# Patient Record
Sex: Female | Born: 1995
Health system: Southern US, Community
[De-identification: ages and names within clinical notes are randomized; demographics above are authoritative.]

## PROBLEM LIST (undated history)

## (undated) DIAGNOSIS — K219 Gastro-esophageal reflux disease without esophagitis: Secondary | ICD-10-CM

## (undated) DIAGNOSIS — F840 Autistic disorder: Secondary | ICD-10-CM

## (undated) DIAGNOSIS — I10 Essential (primary) hypertension: Secondary | ICD-10-CM

## (undated) DIAGNOSIS — J45909 Unspecified asthma, uncomplicated: Secondary | ICD-10-CM

## (undated) HISTORY — PX: TONSILLECTOMY: SUR1361

## (undated) HISTORY — PX: CYSTECTOMY: SUR359

## (undated) HISTORY — PX: DENTAL SURGERY: SHX609

---
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
## 2016-01-16 DIAGNOSIS — Z309 Encounter for contraceptive management, unspecified: Secondary | ICD-10-CM | POA: Diagnosis not present

## 2016-04-09 DIAGNOSIS — H60531 Acute contact otitis externa, right ear: Secondary | ICD-10-CM | POA: Diagnosis not present

## 2016-04-09 DIAGNOSIS — Z309 Encounter for contraceptive management, unspecified: Secondary | ICD-10-CM | POA: Diagnosis not present

## 2016-04-09 DIAGNOSIS — H66001 Acute suppurative otitis media without spontaneous rupture of ear drum, right ear: Secondary | ICD-10-CM | POA: Diagnosis not present

## 2016-07-05 DIAGNOSIS — Z309 Encounter for contraceptive management, unspecified: Secondary | ICD-10-CM | POA: Diagnosis not present

## 2016-07-17 DIAGNOSIS — F329 Major depressive disorder, single episode, unspecified: Secondary | ICD-10-CM | POA: Diagnosis not present

## 2016-07-17 DIAGNOSIS — Z008 Encounter for other general examination: Secondary | ICD-10-CM | POA: Diagnosis not present

## 2016-07-17 DIAGNOSIS — R45851 Suicidal ideations: Secondary | ICD-10-CM | POA: Diagnosis not present

## 2016-07-17 DIAGNOSIS — F321 Major depressive disorder, single episode, moderate: Secondary | ICD-10-CM | POA: Diagnosis not present

## 2016-07-18 DIAGNOSIS — F329 Major depressive disorder, single episode, unspecified: Secondary | ICD-10-CM | POA: Diagnosis not present

## 2016-07-18 DIAGNOSIS — R45851 Suicidal ideations: Secondary | ICD-10-CM | POA: Diagnosis not present

## 2016-09-26 DIAGNOSIS — Z309 Encounter for contraceptive management, unspecified: Secondary | ICD-10-CM | POA: Diagnosis not present

## 2016-10-29 DIAGNOSIS — Z6824 Body mass index (BMI) 24.0-24.9, adult: Secondary | ICD-10-CM | POA: Diagnosis not present

## 2016-10-29 DIAGNOSIS — I10 Essential (primary) hypertension: Secondary | ICD-10-CM | POA: Diagnosis not present

## 2016-10-29 DIAGNOSIS — I482 Chronic atrial fibrillation: Secondary | ICD-10-CM | POA: Diagnosis not present

## 2016-10-29 DIAGNOSIS — F418 Other specified anxiety disorders: Secondary | ICD-10-CM | POA: Diagnosis not present

## 2016-11-14 DIAGNOSIS — Z6825 Body mass index (BMI) 25.0-25.9, adult: Secondary | ICD-10-CM | POA: Diagnosis not present

## 2016-11-14 DIAGNOSIS — I10 Essential (primary) hypertension: Secondary | ICD-10-CM | POA: Diagnosis not present

## 2016-12-12 DIAGNOSIS — I10 Essential (primary) hypertension: Secondary | ICD-10-CM | POA: Diagnosis not present

## 2016-12-12 DIAGNOSIS — Z6824 Body mass index (BMI) 24.0-24.9, adult: Secondary | ICD-10-CM | POA: Diagnosis not present

## 2016-12-12 DIAGNOSIS — Z309 Encounter for contraceptive management, unspecified: Secondary | ICD-10-CM | POA: Diagnosis not present

## 2017-02-08 DIAGNOSIS — Z79899 Other long term (current) drug therapy: Secondary | ICD-10-CM | POA: Diagnosis not present

## 2017-02-08 DIAGNOSIS — F319 Bipolar disorder, unspecified: Secondary | ICD-10-CM | POA: Diagnosis not present

## 2017-02-08 DIAGNOSIS — R45851 Suicidal ideations: Secondary | ICD-10-CM | POA: Diagnosis not present

## 2017-02-14 DIAGNOSIS — Z79899 Other long term (current) drug therapy: Secondary | ICD-10-CM | POA: Diagnosis not present

## 2017-02-14 DIAGNOSIS — Z6824 Body mass index (BMI) 24.0-24.9, adult: Secondary | ICD-10-CM | POA: Diagnosis not present

## 2017-02-14 DIAGNOSIS — F840 Autistic disorder: Secondary | ICD-10-CM | POA: Diagnosis not present

## 2017-02-14 DIAGNOSIS — E119 Type 2 diabetes mellitus without complications: Secondary | ICD-10-CM | POA: Diagnosis not present

## 2017-02-14 DIAGNOSIS — K219 Gastro-esophageal reflux disease without esophagitis: Secondary | ICD-10-CM | POA: Diagnosis not present

## 2017-02-14 DIAGNOSIS — I10 Essential (primary) hypertension: Secondary | ICD-10-CM | POA: Diagnosis not present

## 2017-02-14 DIAGNOSIS — F418 Other specified anxiety disorders: Secondary | ICD-10-CM | POA: Diagnosis not present

## 2017-02-14 DIAGNOSIS — R45851 Suicidal ideations: Secondary | ICD-10-CM | POA: Diagnosis not present

## 2017-02-14 DIAGNOSIS — F333 Major depressive disorder, recurrent, severe with psychotic symptoms: Secondary | ICD-10-CM | POA: Diagnosis not present

## 2017-02-14 DIAGNOSIS — F329 Major depressive disorder, single episode, unspecified: Secondary | ICD-10-CM | POA: Diagnosis not present

## 2017-02-15 DIAGNOSIS — R0602 Shortness of breath: Secondary | ICD-10-CM | POA: Diagnosis not present

## 2017-02-15 DIAGNOSIS — F332 Major depressive disorder, recurrent severe without psychotic features: Secondary | ICD-10-CM | POA: Diagnosis not present

## 2017-02-15 DIAGNOSIS — K219 Gastro-esophageal reflux disease without esophagitis: Secondary | ICD-10-CM | POA: Diagnosis not present

## 2017-02-15 DIAGNOSIS — F333 Major depressive disorder, recurrent, severe with psychotic symptoms: Secondary | ICD-10-CM | POA: Diagnosis not present

## 2017-02-15 DIAGNOSIS — E119 Type 2 diabetes mellitus without complications: Secondary | ICD-10-CM | POA: Diagnosis not present

## 2017-02-15 DIAGNOSIS — F840 Autistic disorder: Secondary | ICD-10-CM | POA: Diagnosis not present

## 2017-02-15 DIAGNOSIS — I10 Essential (primary) hypertension: Secondary | ICD-10-CM | POA: Diagnosis not present

## 2017-02-15 DIAGNOSIS — Z79899 Other long term (current) drug therapy: Secondary | ICD-10-CM | POA: Diagnosis not present

## 2017-02-15 DIAGNOSIS — R45851 Suicidal ideations: Secondary | ICD-10-CM | POA: Diagnosis present

## 2017-02-25 DIAGNOSIS — Z6824 Body mass index (BMI) 24.0-24.9, adult: Secondary | ICD-10-CM | POA: Diagnosis not present

## 2017-02-25 DIAGNOSIS — Z139 Encounter for screening, unspecified: Secondary | ICD-10-CM | POA: Diagnosis not present

## 2017-02-25 DIAGNOSIS — J209 Acute bronchitis, unspecified: Secondary | ICD-10-CM | POA: Diagnosis not present

## 2017-02-28 DIAGNOSIS — F329 Major depressive disorder, single episode, unspecified: Secondary | ICD-10-CM | POA: Diagnosis not present

## 2017-03-20 DIAGNOSIS — Z309 Encounter for contraceptive management, unspecified: Secondary | ICD-10-CM | POA: Diagnosis not present

## 2017-03-20 DIAGNOSIS — Z6825 Body mass index (BMI) 25.0-25.9, adult: Secondary | ICD-10-CM | POA: Diagnosis not present

## 2017-03-20 DIAGNOSIS — F418 Other specified anxiety disorders: Secondary | ICD-10-CM | POA: Diagnosis not present

## 2017-04-08 DIAGNOSIS — F329 Major depressive disorder, single episode, unspecified: Secondary | ICD-10-CM | POA: Diagnosis not present

## 2017-04-08 DIAGNOSIS — H66001 Acute suppurative otitis media without spontaneous rupture of ear drum, right ear: Secondary | ICD-10-CM | POA: Diagnosis not present

## 2017-05-01 DIAGNOSIS — R12 Heartburn: Secondary | ICD-10-CM | POA: Diagnosis not present

## 2017-05-01 DIAGNOSIS — Z6826 Body mass index (BMI) 26.0-26.9, adult: Secondary | ICD-10-CM | POA: Diagnosis not present

## 2017-05-01 DIAGNOSIS — R05 Cough: Secondary | ICD-10-CM | POA: Diagnosis not present

## 2017-05-01 DIAGNOSIS — J40 Bronchitis, not specified as acute or chronic: Secondary | ICD-10-CM | POA: Diagnosis not present

## 2017-05-01 DIAGNOSIS — I10 Essential (primary) hypertension: Secondary | ICD-10-CM | POA: Diagnosis not present

## 2017-05-01 DIAGNOSIS — R0602 Shortness of breath: Secondary | ICD-10-CM | POA: Diagnosis not present

## 2017-05-01 DIAGNOSIS — R0600 Dyspnea, unspecified: Secondary | ICD-10-CM | POA: Diagnosis not present

## 2017-05-08 DIAGNOSIS — F840 Autistic disorder: Secondary | ICD-10-CM | POA: Diagnosis not present

## 2017-05-15 DIAGNOSIS — F329 Major depressive disorder, single episode, unspecified: Secondary | ICD-10-CM | POA: Diagnosis not present

## 2017-05-15 DIAGNOSIS — R45851 Suicidal ideations: Secondary | ICD-10-CM | POA: Diagnosis not present

## 2017-05-15 DIAGNOSIS — F321 Major depressive disorder, single episode, moderate: Secondary | ICD-10-CM | POA: Diagnosis not present

## 2017-05-16 ENCOUNTER — Encounter (HOSPITAL_COMMUNITY): Payer: Self-pay | Admitting: General Practice

## 2017-05-16 ENCOUNTER — Inpatient Hospital Stay (HOSPITAL_COMMUNITY)
Admission: AD | Admit: 2017-05-16 | Discharge: 2017-05-23 | DRG: 885 | Disposition: A | Discharge status: Home / Self Care | Payer: BLUE CROSS/BLUE SHIELD | Source: Intra-hospital | Attending: Psychiatry | Admitting: Psychiatry

## 2017-05-16 DIAGNOSIS — I10 Essential (primary) hypertension: Secondary | ICD-10-CM | POA: Diagnosis present

## 2017-05-16 DIAGNOSIS — F39 Unspecified mood [affective] disorder: Secondary | ICD-10-CM | POA: Diagnosis not present

## 2017-05-16 DIAGNOSIS — F419 Anxiety disorder, unspecified: Secondary | ICD-10-CM | POA: Diagnosis not present

## 2017-05-16 DIAGNOSIS — K219 Gastro-esophageal reflux disease without esophagitis: Secondary | ICD-10-CM | POA: Diagnosis not present

## 2017-05-16 DIAGNOSIS — Z79899 Other long term (current) drug therapy: Secondary | ICD-10-CM

## 2017-05-16 DIAGNOSIS — Z23 Encounter for immunization: Secondary | ICD-10-CM | POA: Diagnosis not present

## 2017-05-16 DIAGNOSIS — R45851 Suicidal ideations: Secondary | ICD-10-CM | POA: Diagnosis not present

## 2017-05-16 DIAGNOSIS — Z888 Allergy status to other drugs, medicaments and biological substances status: Secondary | ICD-10-CM

## 2017-05-16 DIAGNOSIS — G4700 Insomnia, unspecified: Secondary | ICD-10-CM | POA: Diagnosis not present

## 2017-05-16 DIAGNOSIS — F840 Autistic disorder: Secondary | ICD-10-CM | POA: Diagnosis not present

## 2017-05-16 DIAGNOSIS — F329 Major depressive disorder, single episode, unspecified: Secondary | ICD-10-CM | POA: Diagnosis not present

## 2017-05-16 DIAGNOSIS — Z818 Family history of other mental and behavioral disorders: Secondary | ICD-10-CM | POA: Diagnosis not present

## 2017-05-16 DIAGNOSIS — F332 Major depressive disorder, recurrent severe without psychotic features: Principal | ICD-10-CM | POA: Diagnosis present

## 2017-05-16 DIAGNOSIS — Z634 Disappearance and death of family member: Secondary | ICD-10-CM | POA: Diagnosis not present

## 2017-05-16 HISTORY — DX: Autistic disorder: F84.0

## 2017-05-16 HISTORY — DX: Essential (primary) hypertension: I10

## 2017-05-16 HISTORY — DX: Gastro-esophageal reflux disease without esophagitis: K21.9

## 2017-05-16 MED ORDER — HYDROXYZINE HCL 25 MG PO TABS
25.0000 mg | ORAL_TABLET | Freq: Three times a day (TID) | ORAL | Status: DC | PRN
  Administered 2017-05-16 – 2017-05-21 (×6): 25 mg via ORAL
  Filled 2017-05-16 (×6): qty 1

## 2017-05-16 MED ORDER — MAGNESIUM HYDROXIDE 400 MG/5ML PO SUSP: 30.0000 mL | Freq: Every day | ORAL | Status: DC | PRN

## 2017-05-16 MED ORDER — ACETAMINOPHEN 325 MG PO TABS
650.0000 mg | ORAL_TABLET | Freq: Four times a day (QID) | ORAL | Status: DC | PRN
  Administered 2017-05-17 – 2017-05-23 (×5): 650 mg via ORAL
  Filled 2017-05-16 (×5): qty 2

## 2017-05-16 MED ORDER — BENZTROPINE MESYLATE 1 MG PO TABS
1.0000 mg | ORAL_TABLET | Freq: Two times a day (BID) | ORAL | Status: DC
  Administered 2017-05-16 – 2017-05-19 (×7): 1 mg via ORAL
  Filled 2017-05-16 (×11): qty 1

## 2017-05-16 MED ORDER — TRAZODONE HCL 50 MG PO TABS
50.0000 mg | ORAL_TABLET | Freq: Every evening | ORAL | Status: DC | PRN
  Administered 2017-05-16: 50 mg via ORAL
  Filled 2017-05-16 (×3): qty 1

## 2017-05-16 MED ORDER — INFLUENZA VAC SPLIT QUAD 0.5 ML IM SUSY
0.5000 mL | PREFILLED_SYRINGE | INTRAMUSCULAR | Status: AC
  Administered 2017-05-17: 0.5 mL via INTRAMUSCULAR
  Filled 2017-05-16: qty 0.5

## 2017-05-16 MED ORDER — ARIPIPRAZOLE 5 MG PO TABS
5.0000 mg | ORAL_TABLET | Freq: Every day | ORAL | Status: DC
  Administered 2017-05-17: 5 mg via ORAL
  Filled 2017-05-16 (×2): qty 1

## 2017-05-16 MED ORDER — ALUM & MAG HYDROXIDE-SIMETH 200-200-20 MG/5ML PO SUSP
30.0000 mL | ORAL | Status: DC | PRN
  Administered 2017-05-19 – 2017-05-22 (×2): 30 mL via ORAL
  Filled 2017-05-16 (×2): qty 30

## 2017-05-16 NOTE — Tx Team (Signed)
Initial Treatment Plan 05/16/2017 5:31 PM [name] ZOX:[id number]    PATIENT STRESSORS: Loss of sister -suicide  Suicidal Ideation    PATIENT STRENGTHS: General fund of knowledge Supportive family/friends   PATIENT IDENTIFIED PROBLEMS: Suicidal Ideation  Depression  Anxiety    Patient is not able to identify two goals at this time.              DISCHARGE CRITERIA:  Improved stabilization in mood, thinking, and/or behavior Verbal commitment to aftercare and medication compliance  PRELIMINARY DISCHARGE PLAN: Outpatient therapy Return to previous living arrangement  PATIENT/FAMILY INVOLVEMENT: This treatment plan has been presented to and reviewed with the patient, Brittany Palmer.  The patient and family have been given the opportunity to ask questions and make suggestions.  Larina Earthly, RN 05/16/2017, 5:31 PM

## 2017-05-16 NOTE — Progress Notes (Signed)
Patient ID: [name], female   DOB: 1996-05-19, 21 y.o.   MRN: [id number]  Brittany Palmer is a 21 year old female admitted to [facility] voluntarily to the adult unit with suicidal ideations. She reports that she stabbed herself with a knife however it did not make a mark. Skin search is unremarkable other than a scar from a cyst removal on her buttock midline. She currently reports passive SI, she is able to contract for safety. She denies HI and A/V hallucinations. Patient has a reported past medical history of Hypertension, Autism, GERD, Depression, and Anxiety. She denies pain at this time. She does report some intermittent nausea however none at this time. Patient is given toiletry items and is oriented to the unit. Q15 minute safety checks are initiated.

## 2017-05-16 NOTE — BH Assessment (Signed)
Assessment Note  Brittany Palmer is a 21 y.o. female who presented to [facility] on 05/15/2017 and was assessed by TTS Wolfgang Phoenix. The assessment is as follows:   Pt reports SI with a plan to stab herself. Pt states she tried to stab herself recently but it would not go in. Pt states she also tried to squeeze her wrists by trying to commit suicide but was unsuccessful. Pt reports 2 previous SI attempts. Pt states she is currently suicidal because her sister committed suicide in March 2018. Pt resides at [facility] for 6 years. Pt states she needs long-term psychiatric care. Pt denies HI and hallucinations.  Diagnosis: F33.2 MDD  Past Medical History:  Past Medical History:  Diagnosis Date  . Autism   . GERD (gastroesophageal reflux disease)   . Hypertension     Past Surgical History:  Procedure Laterality Date  . TONSILLECTOMY      Family History: History reviewed. No pertinent family history.  Social History:  reports that she has never smoked. She has never used smokeless tobacco. She reports that she does not drink alcohol or use drugs.  Additional Social History:     CIWA: CIWA-Ar BP: (!) 137/95 Pulse Rate: 82 COWS:    Allergies:  Allergies  Allergen Reactions  . Lisinopril Cough  . Risperdal [Risperidone] Other (See Comments)    Twitching of mouth     Home Medications:  Medications Prior to Admission  Medication Sig Dispense Refill  . ARIPiprazole (ABILIFY) 5 MG tablet Take 5 mg by mouth daily.    . benztropine (COGENTIN) 1 MG tablet Take 1 mg by mouth 2 (two) times daily.      OB/GYN Status:  Patient's last menstrual period was 05/16/2017.  General Assessment Data Location of Assessment: [facility] TTS Assessment: Out of system Is this a Tele or Face-to-Face Assessment?: Tele Assessment Is this an Initial Assessment or a Re-assessment for this encounter?: Initial Assessment Marital status: Single Is patient pregnant?:  No Pregnancy Status: No Living Arrangements: Other (Comment), Group Home Can pt return to current living arrangement?: Yes Admission Status: Voluntary Is patient capable of signing voluntary admission?: Yes Referral Source: Self/Family/Friend Insurance type: BCBS     Crisis Care Plan Living Arrangements: Other (Comment), Group Home Legal Guardian: Mother (per pt. ) Name of Psychiatrist: none Name of Therapist: none  Education Status Is patient currently in school?: No  Risk to self with the past 6 months Suicidal Ideation: Yes-Currently Present Has patient been a risk to self within the past 6 months prior to admission? : Yes Suicidal Intent: Yes-Currently Present Has patient had any suicidal intent within the past 6 months prior to admission? : Yes Is patient at risk for suicide?: Yes Suicidal Plan?: Yes-Currently Present Has patient had any suicidal plan within the past 6 months prior to admission? : Yes Specify Current Suicidal Plan: stab self Access to Means: Yes Specify Access to Suicidal Means: access to a knife What has been your use of drugs/alcohol within the last 12 months?: no drug use Previous Attempts/Gestures: Yes How many times?:  (multiple) Triggers for Past Attempts: Unknown Family Suicide History: Yes Recent stressful life event(s): Loss (Comment) Persecutory voices/beliefs?: No Depression: Yes Substance abuse history and/or treatment for substance abuse?: No Suicide prevention information given to non-admitted patients: Not applicable  Risk to Others within the past 6 months Homicidal Ideation: No Does patient have any lifetime risk of violence toward others beyond the six months prior to admission? :  No Thoughts of Harm to Others: No Current Homicidal Intent: No Current Homicidal Plan: No Access to Homicidal Means: No History of harm to others?: No Assessment of Violence: None Noted Does patient have access to weapons?: No Criminal Charges  Pending?: No Does patient have a court date: No Is patient on probation?: No  Psychosis Hallucinations: None noted Delusions: None noted  Mental Status Report Appearance/Hygiene: Unremarkable Eye Contact: Unable to Assess Speech: Loud, Slow Mood: Euthymic Affect: Appropriate to circumstance Thought Processes: Unable to Assess Judgement: Impaired Orientation: Person, Place, Time, Situation Obsessive Compulsive Thoughts/Behaviors: None  Cognitive Functioning Concentration: Unable to Assess Memory: Unable to Assess IQ: Average Insight: Poor Impulse Control: Poor Appetite: Fair Sleep: Unable to Assess Vegetative Symptoms: Unable to Assess  ADLScreening [facility] Assessment Services) Patient's cognitive ability adequate to safely complete daily activities?: Yes Patient able to express need for assistance with ADLs?: Yes Independently performs ADLs?: Yes (appropriate for developmental age)  Prior Inpatient Therapy Prior Inpatient Therapy: Yes Prior Therapy Facilty/Provider(s): [facility]; [facility] Reason for Treatment: SI  Prior Outpatient Therapy Prior Outpatient Therapy: No Does patient have an ACCT team?: No Does patient have Intensive In-House Services?  : No Does patient have [facility] services? : No Does patient have P4CC services?: No  ADL Screening (condition at time of admission) Patient's cognitive ability adequate to safely complete daily activities?: Yes Is the patient deaf or have difficulty hearing?: No Does the patient have difficulty seeing, even when wearing glasses/contacts?: No Does the patient have difficulty concentrating, remembering, or making decisions?: No Patient able to express need for assistance with ADLs?: Yes Does the patient have difficulty dressing or bathing?: No Independently performs ADLs?: Yes (appropriate for developmental age) Does the patient have difficulty walking or climbing stairs?: No Weakness of Legs: None Weakness of Arms/Hands:  None  Home Assistive Devices/Equipment Home Assistive Devices/Equipment: None  Therapy Consults (therapy consults require a physician order) PT Evaluation Needed: No OT Evalulation Needed: No SLP Evaluation Needed: No Abuse/Neglect Assessment (Assessment to be complete while patient is alone) Physical Abuse: Denies Verbal Abuse: Denies Sexual Abuse: Denies Exploitation of patient/patient's resources: Denies Self-Neglect: Denies Values / Beliefs Cultural Requests During Hospitalization: None Spiritual Requests During Hospitalization: None Consults Spiritual Care Consult Needed: No Social Work Consult Needed: No Merchant navy officer (For Healthcare) Does Patient Have a Medical Advance Directive?: No Would patient like information on creating a medical advance directive?: No - Patient declined Nutrition Screen- [facility] Adult/WL/AP Patient's home diet: Regular Has the patient recently lost weight without trying?: No Has the patient been eating poorly because of a decreased appetite?: No Malnutrition Screening Tool Score: 0  Additional Information 1:1 In Past 12 Months?: No CIRT Risk: No Elopement Risk: No Does patient have medical clearance?: Yes     Disposition:  Disposition Initial Assessment Completed for this Encounter: Yes Disposition of Patient: Inpatient treatment program Type of inpatient treatment program: Adult (Dr. Lucianne Muss recommends IP. Pt accepted to [facility] 407-2.)  On Site Evaluation by:   Reviewed with Physician:    Laddie Aquas 05/16/2017 5:33 PM

## 2017-05-16 NOTE — Progress Notes (Signed)
Report received from admitting nurse. Patient denies SI/HI/AVH and pain at this time. Orders received and acknowledged.  Medications administered as per order. Patient verbally contracts for safety on the unit and agrees to come to staff before acting on any self harm thoughts/feelings and other needs or concerns. 15 min checks initiated for safety and patient oriented to the unit and the unit schedule. 

## 2017-05-16 NOTE — Progress Notes (Signed)
D    Pt is pleasant on approach and has been cooperative   She can be intrusive at times and she told staff she feels wired after taking some medication    Pt approached staff around 830 to 9 pm and said she threw up   It was observed to be a large amount of vomitus with food and pill fragments    She did not complain of any stomach problems and said her stomach did not bother her  A   Verbal support given   Medications administered and effectiveness monitored   Q 15 min checks   R   Pt is safe at present time

## 2017-05-17 DIAGNOSIS — F332 Major depressive disorder, recurrent severe without psychotic features: Principal | ICD-10-CM

## 2017-05-17 DIAGNOSIS — I10 Essential (primary) hypertension: Secondary | ICD-10-CM

## 2017-05-17 DIAGNOSIS — F39 Unspecified mood [affective] disorder: Secondary | ICD-10-CM

## 2017-05-17 DIAGNOSIS — Z634 Disappearance and death of family member: Secondary | ICD-10-CM

## 2017-05-17 DIAGNOSIS — G4700 Insomnia, unspecified: Secondary | ICD-10-CM

## 2017-05-17 DIAGNOSIS — F840 Autistic disorder: Secondary | ICD-10-CM | POA: Diagnosis present

## 2017-05-17 DIAGNOSIS — R45851 Suicidal ideations: Secondary | ICD-10-CM

## 2017-05-17 MED ORDER — VERAPAMIL HCL ER 240 MG PO TBCR
240.0000 mg | EXTENDED_RELEASE_TABLET | Freq: Every day | ORAL | Status: DC
  Administered 2017-05-17 – 2017-05-23 (×7): 240 mg via ORAL
  Filled 2017-05-17 (×10): qty 1

## 2017-05-17 MED ORDER — PANTOPRAZOLE SODIUM 40 MG PO TBEC
40.0000 mg | DELAYED_RELEASE_TABLET | Freq: Every day | ORAL | Status: DC
  Administered 2017-05-17 – 2017-05-23 (×7): 40 mg via ORAL
  Filled 2017-05-17 (×10): qty 1

## 2017-05-17 MED ORDER — ARIPIPRAZOLE 5 MG PO TABS
5.0000 mg | ORAL_TABLET | Freq: Two times a day (BID) | ORAL | Status: DC
  Administered 2017-05-17 – 2017-05-23 (×12): 5 mg via ORAL
  Filled 2017-05-17 (×19): qty 1

## 2017-05-17 MED ORDER — LOSARTAN POTASSIUM 50 MG PO TABS
50.0000 mg | ORAL_TABLET | Freq: Every day | ORAL | Status: DC
  Administered 2017-05-17 – 2017-05-23 (×7): 50 mg via ORAL
  Filled 2017-05-17 (×10): qty 1

## 2017-05-17 MED ORDER — TRAZODONE HCL 100 MG PO TABS
100.0000 mg | ORAL_TABLET | Freq: Every day | ORAL | Status: DC
  Administered 2017-05-17 – 2017-05-19 (×3): 100 mg via ORAL
  Filled 2017-05-17 (×4): qty 1

## 2017-05-17 MED ORDER — HYDROCHLOROTHIAZIDE 12.5 MG PO CAPS
12.5000 mg | ORAL_CAPSULE | Freq: Every day | ORAL | Status: DC
  Administered 2017-05-17 – 2017-05-23 (×7): 12.5 mg via ORAL
  Filled 2017-05-17 (×11): qty 1

## 2017-05-17 MED ORDER — SERTRALINE HCL 100 MG PO TABS
100.0000 mg | ORAL_TABLET | Freq: Every day | ORAL | Status: DC
  Administered 2017-05-17 – 2017-05-18 (×2): 100 mg via ORAL
  Filled 2017-05-17 (×5): qty 1
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
## 2017-05-17 NOTE — Progress Notes (Signed)
DAR NOTE: Patient presents with anxious affect and depressed mood.  Denies auditory and visual hallucinations.  Reports suicidal thoughts but contracts for safety.  Described energy level as low and concentration as good.  Rates depression at 9, hopelessness at 5, and anxiety at 10.  Maintained on routine safety checks.  Medications given as prescribed.  Support and encouragement offered as needed.  Attended group and participated.  States goal for today is "quit feeling like crap."  Patient observed socializing with peers in the dayroom.  Patient was loud and intrusive on the unit.

## 2017-05-17 NOTE — [facility] Group Notes (Deleted)
[facility] LCSW Group Therapy  05/17/2017  10:00 AM  Type of Therapy:  Group Therapy  Participation Level:  Did not Attend  Indica Marcott J Carmell Elgin MSW, LCSW  

## 2017-05-17 NOTE — [facility] Group Notes (Signed)
[facility] LCSW Group Therapy Note  Date/Time 05/17/17 10:00AM  Type of Therapy and Topic:  Group Therapy:  Cognitive Distortions  Participation Level:  None   Description of Group:    Patients in this group will be introduced to the topic of cognitive distortions.  Patients will identify and describe cognitive distortions, describe the feelings these distortions create for them.  Patients will identify one or more situations in their personal life where they have cognitively distorted thinking and will verbalize challenging this cognitive distortion through positive thinking skills.  Patients will practice the skill of using positive affirmations to challenge cognitive distortions.    Therapeutic Goals:  1. Patient will identify two or more cognitive distortions they have used 2. Patient will identify one or more emotions that stem from use of a cognitive distortion 3. Patient will demonstrate use of a positive affirmation to counter a cognitive distortion through discussion and/or role play. 4. Patient will describe one way cognitive distortions can be detrimental to wellness   Summary of Patient Progress: Patient present but left 2x during group. Patient did not participate while in group room.   Therapeutic Modalities:   Cognitive Behavioral Therapy Motivational Interviewing   Beverly Sessions MSW, LCSW

## 2017-05-17 NOTE — [facility] Group Notes (Signed)
[facility] Group Notes:  (Nursing/MHT/Case Management/Adjunct)  Date:  05/17/2017  Time:  4:07 PM  Type of Therapy:  Psychoeducational Skills  Participation Level:  Active  Participation Quality:  Appropriate  Affect:  Appropriate  Cognitive:  Appropriate  Insight:  Appropriate  Engagement in Group:  Engaged  Modes of Intervention:  Discussion  Summary of Progress/Problems: Pt did attend self inventory group.   Brittany Palmer Shanta 05/17/2017, 4:07 PM 

## 2017-05-17 NOTE — Progress Notes (Signed)
D    Pt is pleasant on approach and has been cooperative   She can be intrusive at times and she told staff she feels wired after taking some medication         She did not complain of any stomach problems and said her stomach did not bother her   She has been more appropriate today A   Verbal support given   Medications administered and effectiveness monitored   Q 15 min checks   R   Pt is safe at present time
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
## 2017-05-18 DIAGNOSIS — Z818 Family history of other mental and behavioral disorders: Secondary | ICD-10-CM

## 2017-05-18 MED ORDER — SERTRALINE HCL 100 MG PO TABS
150.0000 mg | ORAL_TABLET | Freq: Every day | ORAL | Status: DC
  Administered 2017-05-19 – 2017-05-20 (×2): 150 mg via ORAL
  Filled 2017-05-18 (×3): qty 1

## 2017-05-18 MED ORDER — ONDANSETRON 4 MG PO TBDP
4.0000 mg | ORAL_TABLET | Freq: Three times a day (TID) | ORAL | Status: DC | PRN
  Administered 2017-05-18: 4 mg via ORAL
  Filled 2017-05-18: qty 1

## 2017-05-18 NOTE — Plan of Care (Signed)
Problem: Education: Goal: Ability to make informed decisions regarding treatment will improve Outcome: Progressing Nurse discussed depression/anxiety/coping skills with patient.    

## 2017-05-18 NOTE — Progress Notes (Signed)
[facility] MD Progress Note  05/18/2017 2:22 PM Brittany Palmer  MRN:  [id number]   Subjective:  Patient states that she continue to have suicidal thoughts on and off; Reports that she is eating/sleeping without difficulty; tolerating medications; and has been attending group sessions.  States that she is interested in therapy after discharge and wants information on a group home.  At this time she denies homicidal ideation, psychosis, and paranoia  Objective:  Patient seen by this provider.  Chart reviewed and face to face evaluation on 05/18/17.   On evaluation:  Brittany Palmer calm/cooperative and oriented x 4.  Patient endorses suicidal thoughts but able to contract for safety.  Eating/sleeping without difficulty, tolerating medications without adverse reaction.     Principal Problem: MDD (major depressive disorder), recurrent severe, without psychosis (HCC) Diagnosis:   Patient Active Problem List   Diagnosis Date Noted  . Autism spectrum disorder [F84.0] 05/17/2017  . MDD (major depressive disorder), recurrent severe, without psychosis (HCC) [F33.2] 05/16/2017   Total Time spent with patient: 30 minutes  Past Psychiatric History: Autism, Major depressive disorder, and suicidal ideation  Past Medical History:  Past Medical History:  Diagnosis Date  . Autism   . GERD (gastroesophageal reflux disease)   . Hypertension     Past Surgical History:  Procedure Laterality Date  . TONSILLECTOMY     Family History: History reviewed. No pertinent family history. Family Psychiatric  History: Sister died of suicide Social History:  History  Alcohol Use No     History  Drug Use No    Social History   Social History  . Marital status: Single    Spouse name: N/A  . Number of children: N/A  . Years of education: N/A   Social History Main Topics  . Smoking status: Never Smoker  . Smokeless tobacco: Never Used  . Alcohol use No  . Drug use: No  . Sexual activity: No   Other Topics  Concern  . None   Social History Narrative  . None   Additional Social History:                         Sleep: Good  Appetite:  Good  Current Medications: Current Facility-Administered Medications  Medication Dose Route Frequency Provider Last Rate Last Dose  . acetaminophen (TYLENOL) tablet 650 mg  650 mg Oral Q6H PRN Rankin, Shuvon B, NP   650 mg at 05/18/17 1001  . alum & mag hydroxide-simeth (MAALOX/MYLANTA) 200-200-20 MG/5ML suspension 30 mL  30 mL Oral Q4H PRN Rankin, Shuvon B, NP      . ARIPiprazole (ABILIFY) tablet 5 mg  5 mg Oral BID Money, Gerlene Burdock, FNP   5 mg at 05/18/17 0802  . benztropine (COGENTIN) tablet 1 mg  1 mg Oral BID Nira Conn A, NP   1 mg at 05/18/17 0802  . hydrochlorothiazide (MICROZIDE) capsule 12.5 mg  12.5 mg Oral Daily Money, Gerlene Burdock, FNP   12.5 mg at 05/18/17 0802  . hydrOXYzine (ATARAX/VISTARIL) tablet 25 mg  25 mg Oral TID PRN Nira Conn A, NP   25 mg at 05/18/17 1001  . losartan (COZAAR) tablet 50 mg  50 mg Oral Daily Money, Gerlene Burdock, FNP   50 mg at 05/18/17 0802  . magnesium hydroxide (MILK OF MAGNESIA) suspension 30 mL  30 mL Oral Daily PRN Rankin, Shuvon B, NP      . ondansetron (ZOFRAN-ODT) disintegrating tablet 4 mg  4 mg  Oral Q8H PRN Rankin, Shuvon B, NP   4 mg at 05/18/17 0935  . pantoprazole (PROTONIX) EC tablet 40 mg  40 mg Oral Daily Money, Gerlene Burdock, FNP   40 mg at 05/18/17 0802  . sertraline (ZOLOFT) tablet 100 mg  100 mg Oral Daily Money, Gerlene Burdock, FNP   100 mg at 05/18/17 0802  . traZODone (DESYREL) tablet 100 mg  100 mg Oral QHS Money, Travis B, FNP   100 mg at 05/17/17 2203  . verapamil (CALAN-SR) CR tablet 240 mg  240 mg Oral Daily Money, Gerlene Burdock, FNP   240 mg at 05/18/17 6578    Lab Results: No results found for this or any previous visit (from the past 48 hour(s)).  Blood Alcohol level:  No results found for: [facility]  Metabolic Disorder Labs: Lab Results  Component Value Date   HGBA1C 5.8 (H) 04/12/2011   MPG 120  (H) 04/12/2011   Lab Results  Component Value Date   PROLACTIN 30.2 04/12/2011   Lab Results  Component Value Date   CHOL 115 04/12/2011   TRIG 58 04/12/2011   HDL 47 04/12/2011   CHOLHDL 2.4 04/12/2011   VLDL 12 04/12/2011   LDLCALC 56 04/12/2011    Physical Findings: AIMS: Facial and Oral Movements Muscles of Facial Expression: None, normal Lips and Perioral Area: None, normal Jaw: None, normal Tongue: None, normal,Extremity Movements Upper (arms, wrists, hands, fingers): None, normal Lower (legs, knees, ankles, toes): None, normal, Trunk Movements Neck, shoulders, hips: None, normal, Overall Severity Severity of abnormal movements (highest score from questions above): None, normal Incapacitation due to abnormal movements: None, normal Patient's awareness of abnormal movements (rate only patient's report): No Awareness, Dental Status Current problems with teeth and/or dentures?: No Does patient usually wear dentures?: No  CIWA:  CIWA-Ar Total: 1 COWS:  COWS Total Score: 2  Musculoskeletal: Strength & Muscle Tone: within normal limits Gait & Station: normal Patient leans: N/A  Psychiatric Specialty Exam: Physical Exam  Nursing note and vitals reviewed. Constitutional: She is oriented to person, place, and time.  Neck: Normal range of motion.  Respiratory: Effort normal.  Neurological: She is alert and oriented to person, place, and time.    Review of Systems  Psychiatric/Behavioral: Positive for depression and suicidal ideas (Still have thoughts; but contracts for safety). Negative for hallucinations. The patient is nervous/anxious.   All other systems reviewed and are negative.   Blood pressure 126/75, pulse 86, temperature 97.8 F (36.6 C), temperature source Oral, resp. rate 15, height  (1.651 m), weight 71.6 kg (157 lb 12.8 oz), last menstrual period 05/16/2017.Body mass index is 26.26 kg/m.  General Appearance: Casual and Fairly Groomed  Eye Contact:   Good  Speech:  Clear and Coherent and Normal Rate  Volume:  Normal  Mood:  Anxious and Depressed  Affect:  Congruent and Depressed  Thought Process:  Goal Directed  Orientation:  Full (Time, Place, and Person)  Thought Content:  Denies hallucinations, delusions, and paranoia  Suicidal Thoughts:  Yes.  with intent/plan  Homicidal Thoughts:  No  Memory:  Immediate;   Fair Recent;   Fair Remote;   Fair  Judgement:  Fair  Insight:  Fair  Psychomotor Activity:  Normal  Concentration:  Concentration: Fair and Attention Span: Fair  Recall:  [employer] of Knowledge:  Fair  Language:  Good  Akathisia:  No  Handed:  Right  AIMS (if indicated):     Assets:  Desire for Improvement  Financial Resources/Insurance Housing Social Support  ADL's:  Intact  Cognition:  WNL  Sleep:        Treatment Plan Summary: Daily contact with patient to assess and evaluate symptoms and progress in treatment, Medication management and Plan to   -Abilify 5 mg PO BID for mood control -Cogentin 1 mg PO BID for EPS -Increased Sertraline 150 mg PO Daily for mood control -Losartan, HCTZ, and Verapamil for HTN  -Continue Trazodone 100 mg Po QHS PRN for insomnia -Encourage group therapy participation   Rankin, Denice Bors, NP 05/18/2017, 2:22 PM

## 2017-05-18 NOTE — Progress Notes (Signed)
D:  Patient's self inventory sheet, patient sleeps good, sleep medication helpful.  Fair appetite, normal energy level, good concentration.  Rated depression 5, denied hopeless, rated anxiety 7.  Denied withdrawals.  SI, contracts for safety, no plan.  Physical problems, headaches, then denied pain.  Goal is to overcome depression.  Plans to stay busy.  No discharge plan. A:  Medications administered per MD orders.  Emotional support and encouragement given patient. R:  Patient denied HI.  Denied A/V hallucinations.  SI this morning while talking to nurse, no plan, contracts for safety. Denied HI.  Denied A/V hallucinations.   Zofran effective this morning.

## 2017-05-18 NOTE — [facility] Group Notes (Signed)
Adult Psychoeducational Group Note  Date:  05/18/2017 Time:  4:36 AM  Group Topic/Focus:  Wrap-Up Group:   The focus of this group is to help patients review their daily goal of treatment and discuss progress on daily workbooks.  Participation Level:  Active  Participation Quality:  Appropriate and Attentive  Affect:  Appropriate  Cognitive:  Alert and Appropriate  Insight: Appropriate  Engagement in Group:  Engaged  Modes of Intervention:  Discussion and Education  Additional Comments:  Pt attended and participated in group. I asked pt what they would rate their day and they rated it a 5/10. Pt goal for the day was to quit feeling "like crap". Pt was doing okay with their goal until they got sick today. One positive that the pt relayed to the group was that they got to play the piano.   Brittany Palmer 05/18/2017, 4:36 AM

## 2017-05-18 NOTE — [facility] Suicide Risk Assessment (Signed)
[facility] INPATIENT:  Family/Significant Other Suicide Prevention Education  Suicide Prevention Education:  Contact Attempts: Mother Montanna Mcbain, (name of family member/significant other) has been identified by the patient as the family member/significant other with whom the patient will be residing, and identified as the person(s) who will aid the patient in the event of a mental health crisis.  With written consent from the patient, two attempts were made to provide suicide prevention education, prior to and/or following the patient's discharge.  We were unsuccessful in providing suicide prevention education.  A suicide education pamphlet was given to the patient to share with family/significant other.  Date and time of first attempt:05/17/17 @ 1400 Date and time of second attempt:TBD   Beverly Sessions 05/18/2017, 5:14 PM

## 2017-05-18 NOTE — [facility] Group Notes (Signed)
[facility] LCSW Group Therapy Note  05/18/2017  1:15 to 3 PM   Type of Therapy and Topic: Group Therapy: Feelings Around Returning Home & Establishing a Supportive Framework   Participation Level: Minimal    Description of Group:  Patients first processed thoughts and feelings about up coming discharge. These included fears of upcoming changes, lack of change, new living environments, judgements and expectations from others and overall stigma of MH issues. We then discussed what is a supportive framework? What does it look like feel like and how do I discern it from and unhealthy non-supportive network? Learn how to cope when supports are not helpful and don't support you. Discuss what to do when your family/friends are not supportive.   Therapeutic Goals Addressed in Processing Group:  1. Patient will identify one healthy supportive network that they can use at discharge. 2. Patient will identify one factor of a supportive framework and how to tell it from an unhealthy network. 3. Patient able to identify one coping skill to use when they do not have positive supports from others. 4. Patient will demonstrate ability to communicate their needs through discussion and/or role plays.  Summary of Patient Progress:  Pt engaged only when facilitator asked direct questions during group session. As patients processed their anxiety about discharge and described healthy supports patient was attentive to others as evidenced by eye contact. She is hoping to find a new group home to support her   Carney Bern, LCSW

## 2017-05-19 NOTE — [facility] Counselor (Signed)
Adult Comprehensive Assessment PSA completed on 05/17/17 by Weekend staff  Patient ID: [name], female   DOB: 04-26-1996, 21 y.o.   MRN: [id number]  Information Source: Information source: Patient  Current Stressors:  Housing / Lack of housing: [place] with a lot with girls home which makes her tired Bereavement / Loss: Sister committed suicide in March of 2018  Living/Environment/Situation:  Living Arrangements: Other (Comment), Group Home Living conditions (as described by patient or guardian): [facility] in [facility] How long has patient lived in current situation?: 6 years What is atmosphere in current home: Other (Comment) Hydrologist environment)  Family History:  Marital status: Single Does patient have children?: No  Childhood History:  By whom was/is the patient raised?: Both parents, Adoptive parents Additional childhood history information: Patient states that she was with her biological parents until the age of 49. Patient has been with biological parents since 89 y/o Description of patient's relationship with caregiver when they were a child: pretty good Patient's description of current relationship with people who raised him/her: good Does patient have siblings?: Yes Number of Siblings: 7 Description of patient's current relationship with siblings: sister comiited suicide in March 2018, has 6 brothers. She has a good relationship with her brothers Did patient suffer any verbal/emotional/physical/sexual abuse as a child?: No Did patient suffer from severe childhood neglect?: No Has patient ever been sexually abused/assaulted/raped as an adolescent or adult?: No Was the patient ever a victim of a crime or a disaster?: No Witnessed domestic violence?: No Has patient been effected by domestic violence as an adult?: No  Education:  Highest grade of school patient has completed: Graduated 12th Grade Currently a student?:  No Learning disability?: Yes What learning problems does patient have?: ASD  Employment/Work Situation:   Employment situation: Unemployed Why is patient on disability: unk Patient's job has been impacted by current illness: No Has patient ever been in the [employer]?: No Has patient ever served in combat?: No Did You Receive Any Psychiatric Treatment/Services While in Equities trader?: No Are There Guns or Other Weapons in Your Home?: No  Financial Resources:   Surveyor, quantity resources: Support from parents / caregiver Does patient have a Lawyer or guardian?: Yes Name of representative payee or guardian: Mother, Claretha Townshend is legal guardian  Alcohol/Substance Abuse:   What has been your use of drugs/alcohol within the last 12 months?: denies If attempted suicide, did drugs/alcohol play a role in this?: No Has alcohol/substance abuse ever caused legal problems?: No  Social Support System:   Patient's Community Support System: Good Describe Community Support System: Patient lives in supported living environment; Patient [facility] Type of faith/religion: Ephriam Knuckles How does patient's faith help to cope with current illness?: To an extent  Leisure/Recreation:   Leisure and Hobbies: Swinging on swings; listening to music  Strengths/Needs:   What things does the patient do well?: Playing the piano In what areas does patient struggle / problems for patient: Dealing with Depression  Discharge Plan:   Does patient have access to transportation?: Yes Will patient be returning to same living situation after discharge?: Yes (However patient is seeking group home placement for additional support) Currently receiving community mental health services: Yes (From [facility]om[facility]) If no, would patient like referral for services when discharged?: No Does patient have financial barriers related to discharge medications?: No  Summary/Recommendations:   Summary  and Recommendations (to be completed by the evaluator): Patient is  21 year old female who presented to the ED with suicidal ideation. Patient identified that it was triggered by recent bereavement. Patient would benefit from milieu of inpatient treatment including group therapy, medication management and discharge planning to support outpatient progress. Patient expected to decrease chronic symptoms and step down to lower level of behavioral health treatment in community setting.  Sallee Lange. 05/19/2017

## 2017-05-19 NOTE — Social Work (Signed)
CSW left VM for mother requesting copies of guardianship papers as well as clinicals to substantiate I/DD diagnosis in order to request care coordination for potential placement.  Unable to leave VM as mailbox was full.  Santa Genera, LCSW Lead Clinical Social Worker Phone:  [phone]

## 2017-05-19 NOTE — Plan of Care (Signed)
Problem: Coping: Goal: Ability to cope will improve Outcome: Progressing Nurse discussed depression/anxiety/coping skills with patient.    

## 2017-05-19 NOTE — Progress Notes (Signed)
D: Patient endorses depression and suicide thoughts on/off. Stated her day was "so-so". Seen most evening pacing the hallway. Denies pain, AH/VH at this time. No behavior issues noted.  A: Staff offered support and encouragement as needed. Routine safety checks maintained. Will continue to monitor patient.  R: Patient remains safe on unit.

## 2017-05-19 NOTE — Progress Notes (Signed)
Recreation Therapy Notes  Date: 05/19/17 Time: 0930 Location: 300 Dayroom  Group Topic: Stress Management  Goal Area(s) Addresses:  Patient will verbalize importance of using healthy stress management.  Patient will identify positive emotions associated with healthy stress management.   Behavioral Response: Engaged  Intervention: Stress Management  Activity :  Meditation.  LRT introduced the stress management technique of meditation.  Patients were to listen and follow along as Palmer meditation played from the Calm app.    Education:  Stress Management, Discharge Planning.   Education Outcome: Acknowledges edcuation/In group clarification offered/Needs additional education  Clinical Observations/Feedback: Pt attended group.   Brittany Palmer, LRT/CTRS         Brittany Palmer, Brittany Palmer 05/19/2017 12:00 PM

## 2017-05-19 NOTE — Social Work (Signed)
Judeth Cornfield from [facility] [phone]) called to offer case management assistance w discharge planning for client.   Returned call and left VM requesting call back.  Santa Genera, LCSW Lead Clinical Social Worker Phone:  [phone]

## 2017-05-19 NOTE — Progress Notes (Signed)
[facility] MD Progress Note  05/19/2017 5:23 PM Brittany Palmer  MRN:  [id number]   Subjective:  Patient reports she is feeling " OK". She is focused on disposition planning and in particular states that she is wanting to go to a group home rather than returning to her prior living situation at [facility] . She is somewhat vague about reasons to want change, and states she has been living there for 6 years .  Denies medication side effects, except for mild loose stools. Also complains of R ear otalgia. No fever, no chills.  Objective:  I have discussed case with treatment team and have met with patient. She is a 21 year old single female, lives at [facility]. She has been diagnosed with Autism Spectrum Disorder in the past . She presented due to suicidal ideations, and superficially stabbing self with knife . At this time minimizes depression, and is focused on getting into a group home . Of note, her sister committed suicide several months ago, and patient states that missing sister was a factor in her recent suicidal ideations. CSW has obtained collateral information from patient's mother, who reported patient has history of episodic focus on going to a group home when facing stressors at her current home .  She denies medication side effects. Denies suicidal ideations at this time. Behavior on unit calm and in good control. With RN present ears Bonnita Nasuti were examined, no evidence of tympanic reddening or fullness, she does have slightly erythematous pharynx and some postnasal drip. Of note, has no fever.  Principal Problem: MDD (major depressive disorder), recurrent severe, without psychosis ([place]) Diagnosis:   Patient Active Problem List   Diagnosis Date Noted  . Autism spectrum disorder [F84.0] 05/17/2017  . MDD (major depressive disorder), recurrent severe, without psychosis ([place]) [F33.2] 05/16/2017   Total Time spent with patient: 20 minutes  Past Psychiatric  History: Autism, Major depressive disorder, and suicidal ideation  Past Medical History:  Past Medical History:  Diagnosis Date  . Autism   . GERD (gastroesophageal reflux disease)   . Hypertension     Past Surgical History:  Procedure Laterality Date  . TONSILLECTOMY     Family History: History reviewed. No pertinent family history. Family Psychiatric  History: Sister died of suicide Social History:  History  Alcohol Use No     History  Drug Use No    Social History   Social History  . Marital status: Single    Spouse name: N/A  . Number of children: N/A  . Years of education: N/A   Social History Main Topics  . Smoking status: Never Smoker  . Smokeless tobacco: Never Used  . Alcohol use No  . Drug use: No  . Sexual activity: No   Other Topics Concern  . None   Social History Narrative  . None   Additional Social History:                         Sleep: Good  Appetite:  Good  Current Medications: Current Facility-Administered Medications  Medication Dose Route Frequency Provider Last Rate Last Dose  . acetaminophen (TYLENOL) tablet 650 mg  650 mg Oral Q6H PRN Rankin, Shuvon B, NP   650 mg at 05/18/17 1001  . alum & mag hydroxide-simeth (MAALOX/MYLANTA) 200-200-20 MG/5ML suspension 30 mL  30 mL Oral Q4H PRN Rankin, Shuvon B, NP   30 mL at 05/19/17 0740  . ARIPiprazole (ABILIFY) tablet 5  mg  5 mg Oral BID Money, Lowry Ram, FNP   5 mg at 05/19/17 1713  . benztropine (COGENTIN) tablet 1 mg  1 mg Oral BID Lindon Romp A, NP   1 mg at 05/19/17 1713  . hydrochlorothiazide (MICROZIDE) capsule 12.5 mg  12.5 mg Oral Daily Money, Lowry Ram, FNP   12.5 mg at 05/19/17 0736  . hydrOXYzine (ATARAX/VISTARIL) tablet 25 mg  25 mg Oral TID PRN Rozetta Nunnery, NP   25 mg at 05/19/17 0741  . losartan (COZAAR) tablet 50 mg  50 mg Oral Daily Money, Lowry Ram, FNP   50 mg at 05/19/17 0736  . magnesium hydroxide (MILK OF MAGNESIA) suspension 30 mL  30 mL Oral Daily PRN  Rankin, Shuvon B, NP      . ondansetron (ZOFRAN-ODT) disintegrating tablet 4 mg  4 mg Oral Q8H PRN Rankin, Shuvon B, NP   4 mg at 05/18/17 0935  . pantoprazole (PROTONIX) EC tablet 40 mg  40 mg Oral Daily Money, Lowry Ram, FNP   40 mg at 05/19/17 0736  . sertraline (ZOLOFT) tablet 150 mg  150 mg Oral Daily Rankin, Shuvon B, NP   150 mg at 05/19/17 0736  . traZODone (DESYREL) tablet 100 mg  100 mg Oral QHS Money, Travis B, FNP   100 mg at 05/18/17 2214  . verapamil (CALAN-SR) CR tablet 240 mg  240 mg Oral Daily Money, Lowry Ram, FNP   240 mg at 05/19/17 7654    Lab Results: No results found for this or any previous visit (from the past 48 hour(s)).  Blood Alcohol level:  No results found for: [facility]  Metabolic Disorder Labs: Lab Results  Component Value Date   HGBA1C 5.8 (H) 04/12/2011   MPG 120 (H) 04/12/2011   Lab Results  Component Value Date   PROLACTIN 30.2 04/12/2011   Lab Results  Component Value Date   CHOL 115 04/12/2011   TRIG 58 04/12/2011   HDL 47 04/12/2011   CHOLHDL 2.4 04/12/2011   VLDL 12 04/12/2011   LDLCALC 56 04/12/2011    Physical Findings: AIMS: Facial and Oral Movements Muscles of Facial Expression: None, normal Lips and Perioral Area: None, normal Jaw: None, normal Tongue: None, normal,Extremity Movements Upper (arms, wrists, hands, fingers): None, normal Lower (legs, knees, ankles, toes): None, normal, Trunk Movements Neck, shoulders, hips: None, normal, Overall Severity Severity of abnormal movements (highest score from questions above): None, normal Incapacitation due to abnormal movements: None, normal Patient's awareness of abnormal movements (rate only patient's report): No Awareness, Dental Status Current problems with teeth and/or dentures?: No Does patient usually wear dentures?: No  CIWA:  CIWA-Ar Total: 1 COWS:  COWS Total Score: 2  Musculoskeletal: Strength & Muscle Tone: within normal limits Gait & Station: normal Patient leans:  N/A  Psychiatric Specialty Exam: Physical Exam  Nursing note and vitals reviewed. Constitutional: She is oriented to person, place, and time.  Neck: Normal range of motion.  Respiratory: Effort normal.  Neurological: She is alert and oriented to person, place, and time.    Review of Systems  Psychiatric/Behavioral: Positive for depression and suicidal ideas (Still have thoughts; but contracts for safety). Negative for hallucinations. The patient is nervous/anxious.   All other systems reviewed and are negative. Otalgia, no dyspnea, no chest pain  Blood pressure 126/78, pulse (!) 118, temperature 97.9 F (36.6 C), temperature source Oral, resp. rate 12, height _0  (1.651 m), weight 71.6 kg (157 lb 12.8 oz), last menstrual period  05/16/2017.Body mass index is 26.26 kg/m.  General Appearance: Well Groomed  Eye Contact:  Good  Speech:  speaks in a consistently loud intonation  Volume:  Increased  Mood:  reports she is feeling better, less depressed   Affect:  restricted   Thought Process:  Linear and Descriptions of Associations: Circumstantial  Orientation:  Other:  fully alert and attentive  Thought Content:  Denies hallucinations, no delusions expressed, not internally preoccupied   Suicidal Thoughts:  No at this time denies suicidal plan or intention, and contracts for safety on unit, she is future oriented , denies homicidal ideations  Homicidal Thoughts:  No  Memory: recent and remote grossly intact   Judgement:  Fair  Insight:  Fair  Psychomotor Activity:  Normal  Concentration:  Concentration: Good and Attention Span: Good  Recall:  Good  Fund of Knowledge:  Good  Language:  Good  Akathisia:  No  Handed:  Right  AIMS (if indicated):     Assets:  Desire for Improvement Financial Resources/Insurance Housing Social Support  ADL's:  Intact  Cognition:  WNL  Sleep:  Number of Hours: 6.75   Assessment - patient reports improving mood and presents with a reactive affect  . At this time denies active SI and is future oriented and focused on getting into a group home at discharge. Motivation to change from her current living situation at [facility] is unclear at this time. She is tolerating medications well , but reports loose stools on Zoloft- we discussed, and at this time prefers to continue current dose as side effect mild .  Treatment Plan Summary: Treatment plan reviewed as below today 9/17  Daily contact with patient to assess and evaluate symptoms and progress in treatment, Medication management and Plan to  -Encourage group and milieu participation to work on coping skills and symptom reduction -Continue Abilify 5 mg PO BID for mood disorder -D/C Cogentin- no EPS  And low risk of this side effect on Abilify -Continue Sertraline 150 mg PO Daily for depression and anxiety  -Losartan, HCTZ, and Verapamil for HTN  -Continue Trazodone 100 mg QHS PRN for insomnia as needed  -Treatment team working on disposition planning- see above   Jenne Campus, MD 05/19/2017, 5:23 PM   Patient ID: Irven Shelling, female   DOB: Oct 05, 1995, 21 y.o.   MRN: [id number]

## 2017-05-19 NOTE — Plan of Care (Signed)
Problem: Education: Goal: Ability to make informed decisions regarding treatment will improve Outcome: Progressing Nurse discussed anxiety/depression/coping skills with patient.    

## 2017-05-19 NOTE — Progress Notes (Signed)
Adult Psychoeducational Group Note  Date:  05/19/2017 Time:  10:43 PM  Group Topic/Focus:  Wrap-Up Group:   The focus of this group is to help patients review their daily goal of treatment and discuss progress on daily workbooks.  Participation Level:  Active  Participation Quality:  Appropriate  Affect:  Appropriate  Cognitive:  Appropriate  Insight: Appropriate  Engagement in Group:  Engaged  Modes of Intervention:  Discussion  Additional Comments:  Pt stated her goal for today was to talk with doctor about her after care plan. Pt stated she accomplished her goal. Pt rated her day and 8 out of 10. Pt stated she attend all groups held today.  Felipa Furnace 05/19/2017, 10:43 PM

## 2017-05-19 NOTE — Tx Team (Signed)
Interdisciplinary Treatment and Diagnostic Plan Update  05/19/2017 Time of Session: 9:30am Brittany Palmer MRN: [id number]  Principal Diagnosis: MDD (major depressive disorder), recurrent severe, without psychosis ([place])  Secondary Diagnoses: Principal Problem:   MDD (major depressive disorder), recurrent severe, without psychosis ([place]) Active Problems:   Autism spectrum disorder   Current Medications:  Current Facility-Administered Medications  Medication Dose Route Frequency Provider Last Rate Last Dose  . acetaminophen (TYLENOL) tablet 650 mg  650 mg Oral Q6H PRN Rankin, Shuvon B, NP   650 mg at 05/18/17 1001  . alum & mag hydroxide-simeth (MAALOX/MYLANTA) 200-200-20 MG/5ML suspension 30 mL  30 mL Oral Q4H PRN Rankin, Shuvon B, NP   30 mL at 05/19/17 0740  . ARIPiprazole (ABILIFY) tablet 5 mg  5 mg Oral BID Money, Lowry Ram, FNP   5 mg at 05/19/17 0735  . benztropine (COGENTIN) tablet 1 mg  1 mg Oral BID Lindon Romp A, NP   1 mg at 05/19/17 0735  . hydrochlorothiazide (MICROZIDE) capsule 12.5 mg  12.5 mg Oral Daily Money, Lowry Ram, FNP   12.5 mg at 05/19/17 0736  . hydrOXYzine (ATARAX/VISTARIL) tablet 25 mg  25 mg Oral TID PRN Rozetta Nunnery, NP   25 mg at 05/19/17 0741  . losartan (COZAAR) tablet 50 mg  50 mg Oral Daily Money, Lowry Ram, FNP   50 mg at 05/19/17 0736  . magnesium hydroxide (MILK OF MAGNESIA) suspension 30 mL  30 mL Oral Daily PRN Rankin, Shuvon B, NP      . ondansetron (ZOFRAN-ODT) disintegrating tablet 4 mg  4 mg Oral Q8H PRN Rankin, Shuvon B, NP   4 mg at 05/18/17 0935  . pantoprazole (PROTONIX) EC tablet 40 mg  40 mg Oral Daily Money, Lowry Ram, FNP   40 mg at 05/19/17 0736  . sertraline (ZOLOFT) tablet 150 mg  150 mg Oral Daily Rankin, Shuvon B, NP   150 mg at 05/19/17 0736  . traZODone (DESYREL) tablet 100 mg  100 mg Oral QHS Money, Travis B, FNP   100 mg at 05/18/17 2214  . verapamil (CALAN-SR) CR tablet 240 mg  240 mg Oral Daily Money, Lowry Ram, FNP   240 mg at  05/19/17 9528    PTA Medications: Prescriptions Prior to Admission  Medication Sig Dispense Refill Last Dose  . albuterol (PROVENTIL HFA;VENTOLIN HFA) 108 (90 Base) MCG/ACT inhaler Inhale into the lungs every 6 (six) hours as needed for wheezing or shortness of breath.   Past Month at Unknown time  . ARIPiprazole (ABILIFY) 5 MG tablet Take 5 mg by mouth daily.   Past Week at Unknown time  . benztropine (COGENTIN) 1 MG tablet Take 1 mg by mouth 2 (two) times daily.   Past Week at Unknown time  . hydrochlorothiazide (MICROZIDE) 12.5 MG capsule Take 12.5 mg by mouth daily.   Past Week at Unknown time  . LOSARTAN POTASSIUM PO Take 1 tablet by mouth daily.   Past Week at Unknown time  . sertraline (ZOLOFT) 100 MG tablet Take 100 mg by mouth daily.   Past Week at Unknown time  . verapamil (CALAN-SR) 240 MG CR tablet Take 240 mg by mouth at bedtime.   Past Week at Unknown time    Treatment Modalities: Medication Management, Group therapy, Case management,  1 to 1 session with clinician, Psychoeducation, Recreational therapy.  Patient Stressors: Loss of sister   Patient Strengths: General fund of knowledge Supportive family/friends  Physician Treatment Plan for Primary Diagnosis: MDD (  major depressive disorder), recurrent severe, without psychosis ([facility]) Long Term Goal(s): Improvement in symptoms so as ready for discharge  Short Term Goals: Ability to verbalize feelings will improve Ability to disclose and discuss suicidal ideas Ability to maintain clinical measurements within normal limits will improve Compliance with prescribed medications will improve  Medication Management: Evaluate patient's response, side effects, and tolerance of medication regimen.  Therapeutic Interventions: 1 to 1 sessions, Unit Group sessions and Medication administration.  Evaluation of Outcomes: Not Met  Physician Treatment Plan for Secondary Diagnosis: Principal Problem:   MDD (major depressive disorder),  recurrent severe, without psychosis (H[facility]) Active Problems:   Autism spectrum disorder   Long Term Goal(s): Improvement in symptoms so as ready for discharge  Short Term Goals: Ability to verbalize feelings will improve Ability to disclose and discuss suicidal ideas Ability to maintain clinical measurements within normal limits will improve Compliance with prescribed medications will improve  Medication Management: Evaluate patient's response, side effects, and tolerance of medication regimen.  Therapeutic Interventions: 1 to 1 sessions, Unit Group sessions and Medication administration.  Evaluation of Outcomes: Not Met   RN Treatment Plan for Primary Diagnosis: MDD (major depressive disorder), recurrent severe, without psychosis (H[facility]) Long Term Goal(s): Knowledge of disease and therapeutic regimen to maintain health will improve  Short Term Goals: Ability to disclose and discuss suicidal ideas, Ability to identify and develop effective coping behaviors will improve and Compliance with prescribed medications will improve  Medication Management: RN will administer medications as ordered by provider, will assess and evaluate patient's response and provide education to patient for prescribed medication. RN will report any adverse and/or side effects to prescribing provider.  Therapeutic Interventions: 1 on 1 counseling sessions, Psychoeducation, Medication administration, Evaluate responses to treatment, Monitor vital signs and CBGs as ordered, Perform/monitor CIWA, COWS, AIMS and Fall Risk screenings as ordered, Perform wound care treatments as ordered.  Evaluation of Outcomes: Not Met   LCSW Treatment Plan for Primary Diagnosis: MDD (major depressive disorder), recurrent severe, without psychosis (HCC) Long Term Goal(s): Safe transition to appropriate next level of care at discharge, Engage patient in therapeutic group addressing interpersonal concerns.  Short Term Goals: Engage  patient in aftercare planning with referrals and resources, Identify triggers associated with mental health/substance abuse issues and Increase skills for wellness and recovery  Therapeutic Interventions: Assess for all discharge needs, 1 to 1 time with Social worker, Explore available resources and support systems, Assess for adequacy in community support network, Educate family and significant other(s) on suicide prevention, Complete Psychosocial Assessment, Interpersonal group therapy.  Evaluation of Outcomes: Not Met   Progress in Treatment: Attending groups: Yes Participating in groups: Intermittently Taking medication as prescribed: Yes, MD continues to assess for medication changes as needed Toleration medication: Yes, no side effects reported at this time Family/Significant other contact made: No, CSW attempting to make contact with mother Patient understands diagnosis: Continuing to assess Discussing patient identified problems/goals with staff: Yes Medical problems stabilized or resolved: Yes Denies suicidal/homicidal ideation: No, endorses passive SI Issues/concerns per patient self-inventory: None Other: N/A  New problem(s) identified: None identified at this time.   New Short Term/Long Term Goal(s): None identified at this time.   Discharge Plan or Barriers: Pt will return to [facility] and follow-up at [facility] in [place]  Reason for Continuation of Hospitalization: Anxiety Depression Medication stabilization Suicidal ideation  Estimated Length of Stay: 3-5 days; est DC date 9/21  Attendees: Patient:  05/19/2017  10:58 AM  Physician: Dr. Jama Flavors,  MD 05/19/2017  10:58 AM  Nursing: Rise Paganini, RN 05/19/2017  10:58 AM  RN Care Manager: Lars Pinks, RN 05/19/2017  10:58 AM  Social Worker: Adriana Reams, LCSW 05/19/2017  10:58 AM  Recreational Therapist:  05/19/2017  10:58 AM  Other: Lindell Spar, NP; Marvia Pickles, NP 05/19/2017  10:58 AM  Other:  05/19/2017   10:58 AM  Other: 05/19/2017  10:58 AM    Scribe for Treatment Team: Gladstone Lighter, LCSW 05/19/2017 10:58 AM

## 2017-05-19 NOTE — [facility] Suicide Risk Assessment (Addendum)
[facility] INPATIENT:  Family/Significant Other Suicide Prevention Education  Suicide Prevention Education:  Education Completed; Brittany Palmer, Pt's mother [phone], has been identified by the patient as the family member/significant other with whom the patient will be residing, and identified as the person(s) who will aid the patient in the event of a mental health crisis (suicidal ideations/suicide attempt).  With written consent from the patient, the family member/significant other has been provided the following suicide prevention education, prior to the and/or following the discharge of the patient.  The suicide prevention education provided includes the following:  Suicide risk factors  Suicide prevention and interventions  National Suicide Hotline telephone number  [facility] assessment telephone number  [facility] Emergency Assistance 911  [facility] and/or Residential Mobile Crisis Unit telephone number  Request made of family/significant other to:  Remove weapons (e.g., guns, rifles, knives), all items previously/currently identified as safety concern.    Remove drugs/medications (over-the-counter, prescriptions, illicit drugs), all items previously/currently identified as a safety concern.  The family member/significant other verbalizes understanding of the suicide prevention education information provided.  The family member/significant other agrees to remove the items of safety concern listed above.  Pt's mother reports that Pt has been in and out of the hospital several times and that this is typically her pattern of behavior: something upsets her with family or at the girls' home and then she says she is suicidal to get into the hospital; then upon return she gets a lot of attention. Pt's mother reports that she feels that a group home will provide less independence and help than the girls' home she is at now. Her and Pt's father feel that a group home  would not be as appropriate. They are agreeable to a possible [facility] referral if they can manage it financially.  Brittany Palmer 05/19/2017, 11:53 AM

## 2017-05-19 NOTE — Progress Notes (Signed)
D:  Patient's self inventory sheet, patient sleeps good, sleep medication helpful.  Good appetite, normal energy level, good concentration.  Rated depression 3, denied hopeless, anxiety 5.  Denied withdrawals.  Denied SI.  Physical problems, earache.  Goal is talk to SW about group home.   Plan to talk to SW.  No discharge plans. A:  Medications administered per MD order.  Emotional support and encouragement given patient. R:  Patient denied SI and HI, contracts for safety.  Denied A/V hallucinations.  Safety maintained with 15 minute checks.

## 2017-05-19 NOTE — [facility] Group Notes (Signed)
LCSW Group Therapy Note   05/19/2017 1:15pm   Type of Therapy and Topic:  Group Therapy:  Overcoming Obstacles   Participation Level:  Active   Description of Group:    In this group patients will be encouraged to explore what they see as obstacles to their own wellness and recovery. They will be guided to discuss their thoughts, feelings, and behaviors related to these obstacles. The group will process together ways to cope with barriers, with attention given to specific choices patients can make. Each patient will be challenged to identify changes they are motivated to make in order to overcome their obstacles. This group will be process-oriented, with patients participating in exploration of their own experiences as well as giving and receiving support and challenge from other group members.   Therapeutic Goals: 1. Patient will identify personal and current obstacles as they relate to admission. 2. Patient will identify barriers that currently interfere with their wellness or overcoming obstacles.  3. Patient will identify feelings, thought process and behaviors related to these barriers. 4. Patient will identify two changes they are willing to make to overcome these obstacles:       Therapeutic Modalities:   Cognitive Behavioral Therapy Solution Focused Therapy Motivational Interviewing Relapse Prevention Therapy  Verdene Lennert, LCSW 05/19/2017 2:45 PM

## 2017-05-19 NOTE — Progress Notes (Signed)
  DATA ACTION RESPONSE  Objective- Pt. is visible in the dayroom, seen interacting with peers loudly. Presents with an animated  affect and mood. Pleasant on the unit. No further c/o. No abnormal s/s.  Subjective- Denies having any SI/HI/AVH/Pain at this time. Is cooperative and remain safe on the unit.  1:1 interaction in private to establish rapport. Encouragement, education, & support given from staff.  PRN requested and will re-eval accordingly.   Safety maintained with Q 15 checks. Continue with POC.

## 2017-05-19 NOTE — Plan of Care (Signed)
Problem: Safety: Goal: Periods of time without injury will increase Outcome: Progressing Pt remains a low fall risk, denies SI/HI at this time.   

## 2017-05-20 MED ORDER — TRAZODONE HCL 100 MG PO TABS
100.0000 mg | ORAL_TABLET | Freq: Every evening | ORAL | Status: DC | PRN
Start: 2017-05-20 — End: 2017-05-23
  Administered 2017-05-20 – 2017-05-21 (×2): 100 mg via ORAL
  Filled 2017-05-20 (×2): qty 1

## 2017-05-20 MED ORDER — VENLAFAXINE HCL ER 37.5 MG PO CP24
37.5000 mg | ORAL_CAPSULE | Freq: Every day | ORAL | Status: DC
  Administered 2017-05-21 – 2017-05-23 (×3): 37.5 mg via ORAL
  Filled 2017-05-20 (×5): qty 1

## 2017-05-20 NOTE — Progress Notes (Signed)
[facility] MD Progress Note  05/20/2017 10:08 AM Brittany Palmer  MRN:  [id number] Subjective:  Patient states she is feeling " a little better ". She denies suicidal ideations. She remains focused on wanting to go to a group home .  She describes ongoing loose stools and states she vomited x 1 earlier today. She is unsure if this is medication related because she has had intermittent nausea and vomiting for several weeks, even months, and prior to current medication trials. Diarrhea is a new symptom, started 2 days ago or so.   Objective : I have discussed case with treatment team and have met with patient. Describes partial improvement compared to admission, and denies any suicidal ideations. She reports ongoing anxiety. She remains future oriented and is continuing to express a desire to go to a group home rather than return to the [facility] was [facility] in prior to admission.  As noted, describes chronic GI symptoms- mostly nausea and vomiting , which are intermittent - states this has been occurring for months , even years, and that about one and half years ago had abdominal sonogram " because they thought I had gallstones, but it was normal". She states she has never had endoscopy. As noted, she also reports diarrhea, which she describes as a new onset symptom. ( Of note, has been on Zoloft for 2 months, but dose was increased recently) . Patient has been visible in milieu, going to groups, interactive, behavior in good control.  Principal Problem: MDD (major depressive disorder), recurrent severe, without psychosis ([place]) Diagnosis:   Patient Active Problem List   Diagnosis Date Noted  . Autism spectrum disorder [F84.0] 05/17/2017  . MDD (major depressive disorder), recurrent severe, without psychosis ([place]) [F33.2] 05/16/2017   Total Time spent with patient: 20 minutes  Past Medical History:  Past Medical History:  Diagnosis Date  . Autism   . GERD (gastroesophageal reflux disease)   .  Hypertension     Past Surgical History:  Procedure Laterality Date  . TONSILLECTOMY     Family History: History reviewed. No pertinent family history.  Social History:  History  Alcohol Use No     History  Drug Use No    Social History   Social History  . Marital status: Single    Spouse name: N/A  . Number of children: N/A  . Years of education: N/A   Social History Main Topics  . Smoking status: Never Smoker  . Smokeless tobacco: Never Used  . Alcohol use No  . Drug use: No  . Sexual activity: No   Other Topics Concern  . None   Social History Narrative  . None   Additional Social History:   Sleep: Good  Appetite:  Fair  Current Medications: Current Facility-Administered Medications  Medication Dose Route Frequency Provider Last Rate Last Dose  . acetaminophen (TYLENOL) tablet 650 mg  650 mg Oral Q6H PRN Rankin, Shuvon B, NP   650 mg at 05/20/17 0759  . alum & mag hydroxide-simeth (MAALOX/MYLANTA) 200-200-20 MG/5ML suspension 30 mL  30 mL Oral Q4H PRN Rankin, Shuvon B, NP   30 mL at 05/19/17 0740  . ARIPiprazole (ABILIFY) tablet 5 mg  5 mg Oral BID Money, Lowry Ram, FNP   5 mg at 05/20/17 0750  . hydrochlorothiazide (MICROZIDE) capsule 12.5 mg  12.5 mg Oral Daily Money, Lowry Ram, FNP   12.5 mg at 05/20/17 0748  . hydrOXYzine (ATARAX/VISTARIL) tablet 25 mg  25 mg Oral TID PRN Gwenlyn Found,  Rinaldo Ratel, NP   25 mg at 05/20/17 0759  . losartan (COZAAR) tablet 50 mg  50 mg Oral Daily Money, Lowry Ram, FNP   50 mg at 05/20/17 0748  . magnesium hydroxide (MILK OF MAGNESIA) suspension 30 mL  30 mL Oral Daily PRN Rankin, Shuvon B, NP      . ondansetron (ZOFRAN-ODT) disintegrating tablet 4 mg  4 mg Oral Q8H PRN Rankin, Shuvon B, NP   4 mg at 05/18/17 0935  . pantoprazole (PROTONIX) EC tablet 40 mg  40 mg Oral Daily Money, Lowry Ram, FNP   40 mg at 05/20/17 0748  . sertraline (ZOLOFT) tablet 150 mg  150 mg Oral Daily Rankin, Shuvon B, NP   150 mg at 05/20/17 0748  . traZODone  (DESYREL) tablet 100 mg  100 mg Oral QHS Money, Travis B, FNP   100 mg at 05/19/17 2119  . verapamil (CALAN-SR) CR tablet 240 mg  240 mg Oral Daily Money, Lowry Ram, FNP   240 mg at 05/20/17 1610    Lab Results: No results found for this or any previous visit (from the past 48 hour(s)).  Blood Alcohol level:  No results found for: [facility]  Metabolic Disorder Labs: Lab Results  Component Value Date   HGBA1C 5.8 (H) 04/12/2011   MPG 120 (H) 04/12/2011   Lab Results  Component Value Date   PROLACTIN 30.2 04/12/2011   Lab Results  Component Value Date   CHOL 115 04/12/2011   TRIG 58 04/12/2011   HDL 47 04/12/2011   CHOLHDL 2.4 04/12/2011   VLDL 12 04/12/2011   LDLCALC 56 04/12/2011    Physical Findings: AIMS: Facial and Oral Movements Muscles of Facial Expression: None, normal Lips and Perioral Area: None, normal Jaw: None, normal Tongue: None, normal,Extremity Movements Upper (arms, wrists, hands, fingers): None, normal Lower (legs, knees, ankles, toes): None, normal, Trunk Movements Neck, shoulders, hips: None, normal, Overall Severity Severity of abnormal movements (highest score from questions above): None, normal Incapacitation due to abnormal movements: None, normal Patient's awareness of abnormal movements (rate only patient's report): No Awareness, Dental Status Current problems with teeth and/or dentures?: No Does patient usually wear dentures?: No  CIWA:  CIWA-Ar Total: 1 COWS:  COWS Total Score: 1  Musculoskeletal: Strength & Muscle Tone: within normal limits Gait & Station: normal Patient leans: N/A  Psychiatric Specialty Exam: Physical Exam  ROS no headache, no chest pain, intermittent nausea, vomited x 1 earlier today. Denies abdominal pain. (+) loose stools , no fever, no rash  Blood pressure 116/78, pulse 100, temperature 97.9 F (36.6 C), temperature source Oral, resp. rate 16, height _0  (1.651 m), weight 71.6 kg (157 lb 12.8 oz), last menstrual  period 05/16/2017.Body mass index is 26.26 kg/m.  General Appearance: Fairly Groomed  Eye Contact:  Good  Speech:  Normal Rate  Volume:  Normal  Mood:  improving mood   Affect:  improving, vaguely anxious, does smile at times appropriately  Thought Process:  Linear and Descriptions of Associations: Intact  Orientation:  Other:  fully alert and attentive   Thought Content:  denies hallucinations, no delusions, not internally preoccupied   Suicidal Thoughts:  No denies any current suicidal ideations, denies current self injurious ideations  Homicidal Thoughts:  No denies any homicidal ideations   Memory:  recent and remote grossly intact   Judgement:  Fair- improving  Insight:  Fair  Psychomotor Activity:  Normal  Concentration:  Concentration: Good and Attention Span: Good  Recall:  Good  Fund of Knowledge:  Good  Language:  Good  Akathisia:  Negative  Handed:  Right  AIMS (if indicated):     Assets:  Communication Skills Desire for Improvement Resilience  ADL's:  Intact  Cognition:  WNL  Sleep:  Number of Hours: 6.75   Assessment - patient presents with improving mood. Currently affect presents reactive and full in range. Denies current suicidal ideations. She remains focused on going to a group home rather than returning to [facility], where she had been residing. History of intermittent nausea, vomiting which has been occurring for more than a year. Diarrhea is a new symptom and may be temporally related to recent Zoloft dose titration. We discussed options, and she prefers to stop Zoloft and start another antidepressant trial. States Celexa did not help in the past. Agrees to Effexor XR trial.  Treatment Plan Summary: Daily contact with patient to assess and evaluate symptoms and progress in treatment, Medication management, Plan inpatient treatment  and medications as below Continue to encourage group and milieu participation to work on coping skills. CSW /team working  on disposition planning - of note, mother is legal guardian Continue Abilify 5 mgrs BID for mood disorder, depression Continue Trazodone 100 mgrs QHS PRN for insomnia  D/C Zoloft  Start Effexor XR 37.5 mgrs QAM for depression and anxiety Continue antihypertensive medication management .    Jenne Campus, MD 05/20/2017, 10:08 AM

## 2017-05-20 NOTE — [facility] Group Notes (Signed)
LCSW Group Therapy Note  05/20/2017 1:15pm  Type of Therapy/Topic:  Group Therapy:  Feelings about Diagnosis  Participation Level:  Active   Description of Group:   This group will allow patients to explore their thoughts and feelings about diagnoses they have received. Patients will be guided to explore their level of understanding and acceptance of these diagnoses. Facilitator will encourage patients to process their thoughts and feelings about the reactions of others to their diagnosis and will guide patients in identifying ways to discuss their diagnosis with significant others in their lives. This group will be process-oriented, with patients participating in exploration of their own experiences, giving and receiving support, and processing challenge from other group members.   Therapeutic Goals: 1. Patient will demonstrate understanding of diagnosis as evidenced by identifying two or more symptoms of the disorder 2. Patient will be able to express two feelings regarding the diagnosis 3. Patient will demonstrate their ability to communicate their needs through discussion and/or role play  Summary of Patient Progress: Pt continues to be active in group discussion, and reports that her Autism diagnosis often limits her freedom and ability to be independent.    Therapeutic Modalities:   Cognitive Behavioral Therapy Brief Therapy Feelings Identification    Verdene Lennert, LCSW 05/20/2017 5:50 PM

## 2017-05-20 NOTE — Progress Notes (Signed)
Recreation Therapy Notes  Animal-Assisted Activity (AAA) Program Checklist/Progress Notes Patient Eligibility Criteria Checklist & Daily Group note for Rec TxIntervention  Date: 09.18.2018 Time: 2:45pm Location: [address]   AAA/T Program Assumption of Risk Form signed by Patient/ or Parent Legal Guardian Yes  Patient is free of allergies or sever asthma Yes  Patient reports no fear of animals Yes  Patient reports no history of cruelty to animals Yes  Patient understands his/her participation is voluntary Yes  Patient washes hands before animal contact Yes  Patient washes hands after animal contact Yes  Behavioral Response: Appropriate, Engaged   Education:Hand Washing, Appropriate Animal Interaction   Education Outcome: Acknowledges education.   Clinical Observations/Feedback: Patient attended session and interacted appropriately with therapy dog and peers. Patient asked appropriate questions about therapy dog and his training.   Marykay Lex Patric Buckhalter, LRT/CTRS        Milad Bublitz L 05/20/2017 3:05 PM

## 2017-05-20 NOTE — Plan of Care (Signed)
Problem: Coping: Goal: Ability to cope will improve Outcome: Progressing Nurse discussed anxiety/depression/coping skills with patient.    

## 2017-05-20 NOTE — Progress Notes (Signed)
D:  Patient's self inventory sheet, patient sleeps good, sleep medication helpful.  Good appetite, normal energy level, good concentration.  Rated anxiety 5, denied depression and hopeless..  Denied withdrawals.  Denied SI.  Denied physical problems.  Denied physical pain.  Goal is to not be anxious.  Plans to stay busy.  No discharge plans. A:  Medications administered per MD orders.  Emotional support and encouragement given patient. R:  Denied SI and HI, contracts for safety.  Denied A/V hallucinations.  Safety maintained with 15 minute checks.

## 2017-05-21 NOTE — Progress Notes (Signed)
Brittany Palmer had been up and visible in milieu this evening, did attend and participate in evening group activity. She was seen smiling and interacting appropriately with peers. She denied any SI this evening and did not verbalize any complaints of pain or nausea. She was able to receive bedtime medications without incident. A. Support and encouragement provided. R. Safety maintained, will continue to monitor.

## 2017-05-21 NOTE — Progress Notes (Signed)
Brittany Palmer  MRN:  [id number] Subjective:  Patient reports mood is "OK" and denies feeling particularly depressed at this time.  She states she remains anxious.  She reports ongoing chronic GI symptoms , frequent vomiting. Reports improving loose stools. Denies medication side effects .  Objective : I have discussed case with treatment team and have met with patient. Patient presents well related, pleasant. Currently denying significant depression or neuro-vegetative symptoms but reports ongoing anxiety. At this time does not endorse significant medication side effects and GI symptoms appear to be improving: less vomiting, no nausea at present, improved appetite, decreased loose stools. ( was recently changed from Zoloft to Effexor XR)  Remains focused on going to a group home, but as discussed with CSW, this disposition a lengthy process, to include her mother applying for medicaid on patient's behalf and obtaining documentation from mother documenting Autism diagnosis, history. CSW has been in contact with mother and has discussed above issues .   Principal Problem: MDD (major depressive disorder), recurrent severe, without psychosis ([place]) Diagnosis:   Patient Active Problem List   Diagnosis Date Noted  . Autism spectrum disorder [F84.0] 05/17/2017  . MDD (major depressive disorder), recurrent severe, without psychosis ([place]) [F33.2] 05/16/2017   Total Time spent with patient: 20 minutes  Past Medical History:  Past Medical History:  Diagnosis Date  . Autism   . GERD (gastroesophageal reflux disease)   . Hypertension     Past Surgical History:  Procedure Laterality Date  . TONSILLECTOMY     Family History: History reviewed. No pertinent family history.  Social History:  History  Alcohol Use No     History  Drug Use No    Social History   Social History  . Marital status: Single    Spouse name: N/A  . Number of children: N/A   . Years of education: N/A   Social History Main Topics  . Smoking status: Never Smoker  . Smokeless tobacco: Never Used  . Alcohol use No  . Drug use: No  . Sexual activity: No   Other Topics Concern  . None   Social History Narrative  . None   Additional Social History:   Sleep: Good  Appetite:  improving   Current Medications: Current Facility-Administered Medications  Medication Dose Route Frequency Provider Last Rate Last Dose  . acetaminophen (TYLENOL) tablet 650 mg  650 mg Oral Q6H PRN Rankin, Shuvon B, NP   650 mg at 05/20/17 0759  . alum & mag hydroxide-simeth (MAALOX/MYLANTA) 200-200-20 MG/5ML suspension 30 mL  30 mL Oral Q4H PRN Rankin, Shuvon B, NP   30 mL at 05/19/17 0740  . ARIPiprazole (ABILIFY) tablet 5 mg  5 mg Oral BID Money, Lowry Ram, FNP   5 mg at 05/21/17 0810  . hydrochlorothiazide (MICROZIDE) capsule 12.5 mg  12.5 mg Oral Daily Money, Lowry Ram, FNP   12.5 mg at 05/21/17 0810  . hydrOXYzine (ATARAX/VISTARIL) tablet 25 mg  25 mg Oral TID PRN Lindon Romp A, NP   25 mg at 05/21/17 1316  . losartan (COZAAR) tablet 50 mg  50 mg Oral Daily Money, Lowry Ram, FNP   50 mg at 05/21/17 0810  . magnesium hydroxide (MILK OF MAGNESIA) suspension 30 mL  30 mL Oral Daily PRN Rankin, Shuvon B, NP      . ondansetron (ZOFRAN-ODT) disintegrating tablet 4 mg  4 mg Oral Q8H PRN Rankin, Shuvon B, NP   4 mg  at 05/18/17 0935  . pantoprazole (PROTONIX) EC tablet 40 mg  40 mg Oral Daily Money, Lowry Ram, FNP   40 mg at 05/21/17 0810  . traZODone (DESYREL) tablet 100 mg  100 mg Oral QHS PRN Cobos, Myer Peer, MD   100 mg at 05/20/17 2157  . venlafaxine XR (EFFEXOR-XR) 24 hr capsule 37.5 mg  37.5 mg Oral Q breakfast Cobos, Myer Peer, MD   37.5 mg at 05/21/17 0810  . verapamil (CALAN-SR) CR tablet 240 mg  240 mg Oral Daily Money, Lowry Ram, FNP   240 mg at 05/21/17 6948    Lab Results: No results found for this or any previous visit (from the past 48 hour(s)).  Blood Alcohol level:   No results found for: [facility]  Metabolic Disorder Labs: Lab Results  Component Value Date   HGBA1C 5.8 (H) 04/12/2011   MPG 120 (H) 04/12/2011   Lab Results  Component Value Date   PROLACTIN 30.2 04/12/2011   Lab Results  Component Value Date   CHOL 115 04/12/2011   TRIG 58 04/12/2011   HDL 47 04/12/2011   CHOLHDL 2.4 04/12/2011   VLDL 12 04/12/2011   LDLCALC 56 04/12/2011    Physical Findings: AIMS: Facial and Oral Movements Muscles of Facial Expression: None, normal Lips and Perioral Area: None, normal Jaw: None, normal Tongue: None, normal,Extremity Movements Upper (arms, wrists, hands, fingers): None, normal Lower (legs, knees, ankles, toes): None, normal, Trunk Movements Neck, shoulders, hips: None, normal, Overall Severity Severity of abnormal movements (highest score from questions above): None, normal Incapacitation due to abnormal movements: None, normal Patient's awareness of abnormal movements (rate only patient's report): No Awareness, Dental Status Current problems with teeth and/or dentures?: No Does patient usually wear dentures?: No  CIWA:  CIWA-Ar Total: 1 COWS:  COWS Total Score: 2  Musculoskeletal: Strength & Muscle Tone: within normal limits Gait & Station: normal Patient leans: N/A  Psychiatric Specialty Exam: Physical Exam  ROS no chest pain, no shortness of breath, improving vomiting , improving diarrhea, no nausea at this time  Blood pressure 125/77, pulse (!) 122, temperature 97.9 F (36.6 C), resp. rate 16, height '5\' 5"'$  (1.651 m), weight 71.6 kg (157 lb 12.8 oz), last menstrual period 05/16/2017.Body mass index is 26.26 kg/m.  General Appearance: improved grooming   Eye Contact:  Good  Speech:  Normal Rate  Volume:  Normal  Mood:  improving   Affect:  Appropriate and reactive  Thought Process:  Linear and Descriptions of Associations: Intact  Orientation:  Other:  fully alert and attentive   Thought Content:  denies hallucinations, no  delusions, not internally preoccupied   Suicidal Thoughts:  No denies any current suicidal ideations, denies current self injurious ideations  Homicidal Thoughts:  No denies any homicidal ideations   Memory:  recent and remote grossly intact   Judgement:  Fair- improving  Insight:  Fair- improving  Psychomotor Activity:  Normal  Concentration:  Concentration: Good and Attention Span: Good  Recall:  Good  Fund of Knowledge:  Good  Language:  Good  Akathisia:  Negative  Handed:  Right  AIMS (if indicated):     Assets:  Communication Skills Desire for Improvement Resilience  ADL's:  Intact  Cognition:  WNL  Sleep:  Number of Hours: 5.75   Assessment -patient presents with improvement compared to admission. Minimizes depression at this time and affect is reactive. Denies suicidal ideations or self injurious ideations at this time. Remains focused on going to a  group home, but appears less anxious about disposition and more understanding that this plan is a process that will require some time. 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 Today spoke a little more about reasons she may want to move out of the [facility]. States the average age there is 30-18 and that she is the oldest , which could  be a stressor for her . Thus far tolerating Effexor XR trial well.   Treatment Plan Summary: Daily contact with patient to assess and evaluate symptoms and progress in treatment, Medication management, Plan inpatient treatment  and medications as below Continue to encourage group and milieu participation to work on coping skills. CSW /team working on disposition planning - of note, mother is legal guardian Continue Abilify 5 mgrs BID for mood disorder, depression Continue Trazodone 100 mgrs QHS PRN for insomnia  Continue  Effexor XR 37.5 mgrs QAM for depression and anxiety Continue antihypertensive medication management .     Jenne Campus, MD 05/21/2017, 1:35 PM   Patient ID: Irven Shelling, female   DOB: 09/23/1995, 21 y.o.   MRN: [id number]

## 2017-05-21 NOTE — Plan of Care (Signed)
Problem: Safety: Goal: Ability to disclose and discuss suicidal ideas will improve Outcome: Progressing Patient denies any current suicidal thoughts, denies AVH/HI.   Goal: Ability to identify and utilize support systems that promote safety will improve Outcome: Progressing Patient maintained on Q15 minute safety checks, and is able to seek staff's assistance as needed.    Comments: Patient was visible in the milieu all day, was appropriate in her interactions with peers and staff, was calm/cooperative, and took all meds as scheduled.  Pt reports that she slept well last night, reports a good appetite, ate 100% of all of her meals for today as per her report.  Pt reports having a high energy level, denies being depressed currently, denies being hopeless, but reported being anxious earlier in the shift, and was medicated with Atarax  with improvement in her anxiety observed after an hour.  Pt reported earlier in the shift that her goal for the day was to "not get anxious", and reported that she would "distract myself" in order to accomplish that goal.  Patient had a bright affect through entire shift.

## 2017-05-21 NOTE — Progress Notes (Signed)
Patient ID: Brittany Palmer, female   DOB: [birth year], 21 y.o.   MRN: [id number] Pt's HR-122, B/P-125/77, pt reports restlessness, inability to relax, and anxiety.  Pt medicated with Atarax  PO.  Will continue to monitor.

## 2017-05-21 NOTE — Social Work (Signed)
Patient has case Production designer, theatre/television/film w [facility]) - [employer] [place] - CM has left 2 VMs for CSW, both calls have been returned and messages left requesting call from case manager.  Awaiting response from insurance company CM.  Santa Genera, LCSW Lead Clinical Social Worker Phone:  [phone]

## 2017-05-21 NOTE — Progress Notes (Signed)
Patient attended group and said that her day was a 9.  Her coping skill were playing the piano, basketball, and singing.

## 2017-05-21 NOTE — Progress Notes (Signed)
Recreation Therapy Notes  Date: 05/21/17 Time: 0930 Location: [address] Day[address]  Group Topic: Stress Management  Goal Area(s) Addresses:  Patient will verbalize importance of using healthy stress management.  Patient will identify positive emotions associated with healthy stress management.   Behavioral Response: Engaged  Intervention: Stress Management  Activity :  Progressive Muscle Relaxation.  LRT introduced the stress management technique of progressive muscle relaxation.  LRT read a script that instructed patients to tense then relax each muscle group individually.  Patients were to follow along as the script was read to fully engage in the practice.  Education:  Stress Management, Discharge Planning.   Education Outcome: Acknowledges edcuation/In group clarification offered/Needs additional education  Clinical Observations/Feedback: Pt attended group.    Caroll Rancher, LRT/CTRS         Caroll Rancher A 05/21/2017 12:13 PM

## 2017-05-21 NOTE — Progress Notes (Signed)
Adult Psychoeducational Group Note  Date:  05/21/2017 Time:  2:02 AM  Group Topic/Focus:  Wrap-Up Group:   The focus of this group is to help patients review their daily goal of treatment and discuss progress on daily workbooks.  Participation Level:  Active  Participation Quality:  Appropriate  Affect:  Appropriate  Cognitive:  Appropriate  Insight: Appropriate  Engagement in Group:  Engaged  Modes of Intervention:  Discussion  Additional Comments:  Pt stated her goal for today was to make it through the day with no negative thoughts. Pt stated she accomplished her goal today and felt good about it. Pt rated her over all day a 8 out of 10. Pt stated she attended all groups held today.  Brittany Palmer 05/21/2017, 2:02 AM

## 2017-05-21 NOTE — [facility] Group Notes (Signed)
[facility] Group Therapy 05/21/2017 1:15pm  Type of Therapy: [facility] Presentation  Participation Level: Active  Participation Quality: Attentive  Affect: Appropriate  Cognitive: Oriented  Insight: Developing/Improving  Engagement in Therapy: Engaged  Modes of Intervention: Discussion, Education and Socialization  Summary of Progress/Problems: Mental [facility] Association (MHA) Speaker came to talk about his personal journey with mental health. The pt processed ways by which to relate to the speaker. MHA speaker provided handouts and educational information pertaining to groups and services offered by the MHA. Pt was engaged in speaker's presentation and was receptive to resources provided.    Brittany Palmer Brittany Danira Nylander, LCSW 05/21/2017 4:35 PM  

## 2017-05-22 DIAGNOSIS — F419 Anxiety disorder, unspecified: Secondary | ICD-10-CM

## 2017-05-22 NOTE — Progress Notes (Signed)
[facility] MD Progress Note  05/22/2017 8:10 PM Brittany Palmer  MRN:  [id number]  Subjective:  Patient reported that she is doing good and denies any SI/HI/AVH. She still reports missing her sister that died from suicide. She is okay with going back to [employer], but would still like to go to a group home. She contracts for safety on the unit.  Objective : Patient is pleasant and cooperative. She has been seen in the milieu numerous times and has been interacting with staff and other patients appropriately.   Principal Problem: MDD (major depressive disorder), recurrent severe, without psychosis (HCC) Diagnosis:   Patient Active Problem List   Diagnosis Date Noted  . Autism spectrum disorder [F84.0] 05/17/2017  . MDD (major depressive disorder), recurrent severe, without psychosis (HCC) [F33.2] 05/16/2017   Total Time spent with patient: 15 minutes  Past Medical History:  Past Medical History:  Diagnosis Date  . Autism   . GERD (gastroesophageal reflux disease)   . Hypertension     Past Surgical History:  Procedure Laterality Date  . TONSILLECTOMY     Family History: History reviewed. No pertinent family history.  Social History:  History  Alcohol Use No     History  Drug Use No    Social History   Social History  . Marital status: Single    Spouse name: N/A  . Number of children: N/A  . Years of education: N/A   Social History Main Topics  . Smoking status: Never Smoker  . Smokeless tobacco: Never Used  . Alcohol use No  . Drug use: No  . Sexual activity: No   Other Topics Concern  . None   Social History Narrative  . None   Additional Social History:   Sleep: Good  Appetite:  improving   Current Medications: Current Facility-Administered Medications  Medication Dose Route Frequency Provider Last Rate Last Dose  . acetaminophen (TYLENOL) tablet 650 mg  650 mg Oral Q6H PRN Rankin, Shuvon B, NP   650 mg at 05/20/17 0759  . alum & mag hydroxide-simeth  (MAALOX/MYLANTA) 200-200-20 MG/5ML suspension 30 mL  30 mL Oral Q4H PRN Rankin, Shuvon B, NP   30 mL at 05/19/17 0740  . ARIPiprazole (ABILIFY) tablet 5 mg  5 mg Oral BID Money, Gerlene Burdock, FNP   5 mg at 05/22/17 1706  . hydrochlorothiazide (MICROZIDE) capsule 12.5 mg  12.5 mg Oral Daily Money, Gerlene Burdock, FNP   12.5 mg at 05/22/17 1208  . hydrOXYzine (ATARAX/VISTARIL) tablet 25 mg  25 mg Oral TID PRN Nira Conn A, NP   25 mg at 05/21/17 1316  . losartan (COZAAR) tablet 50 mg  50 mg Oral Daily Money, Gerlene Burdock, FNP   50 mg at 05/22/17 1208  . magnesium hydroxide (MILK OF MAGNESIA) suspension 30 mL  30 mL Oral Daily PRN Rankin, Shuvon B, NP      . ondansetron (ZOFRAN-ODT) disintegrating tablet 4 mg  4 mg Oral Q8H PRN Rankin, Shuvon B, NP   4 mg at 05/18/17 0935  . pantoprazole (PROTONIX) EC tablet 40 mg  40 mg Oral Daily Money, Gerlene Burdock, FNP   40 mg at 05/22/17 4098  . traZODone (DESYREL) tablet 100 mg  100 mg Oral QHS PRN Cobos, Rockey Situ, MD   100 mg at 05/21/17 2158  . venlafaxine XR (EFFEXOR-XR) 24 hr capsule 37.5 mg  37.5 mg Oral Q breakfast Cobos, Rockey Situ, MD   37.5 mg at 05/22/17 0828  . verapamil (CALAN-SR) CR  tablet 240 mg  240 mg Oral Daily Money, Gerlene Burdock, [place]   240 mg at 05/22/17 1610    Lab Results: No results found for this or any previous visit (from the past 48 hour(s)).  Blood Alcohol level:  No results found for: [facility]  Metabolic Disorder Labs: Lab Results  Component Value Date   HGBA1C 5.8 (H) 04/12/2011   MPG 120 (H) 04/12/2011   Lab Results  Component Value Date   PROLACTIN 30.2 04/12/2011   Lab Results  Component Value Date   CHOL 115 04/12/2011   TRIG 58 04/12/2011   HDL 47 04/12/2011   CHOLHDL 2.4 04/12/2011   VLDL 12 04/12/2011   LDLCALC 56 04/12/2011    Physical Findings: AIMS: Facial and Oral Movements Muscles of Facial Expression: None, normal Lips and Perioral Area: None, normal Jaw: None, normal Tongue: None, normal,Extremity Movements Upper  (arms, wrists, hands, fingers): None, normal Lower (legs, knees, ankles, toes): None, normal, Trunk Movements Neck, shoulders, hips: None, normal, Overall Severity Severity of abnormal movements (highest score from questions above): None, normal Incapacitation due to abnormal movements: None, normal Patient's awareness of abnormal movements (rate only patient's report): No Awareness, Dental Status Current problems with teeth and/or dentures?: No Does patient usually wear dentures?: No  CIWA:  CIWA-Ar Total: 1 COWS:  COWS Total Score: 2  Musculoskeletal: Strength & Muscle Tone: within normal limits Gait & Station: normal Patient leans: N/A  Psychiatric Specialty Exam: Physical Exam  Nursing note and vitals reviewed. Constitutional: She is oriented to person, place, and time. She appears well-developed and well-nourished.  Cardiovascular: Normal rate.   Musculoskeletal: Normal range of motion.  Neurological: She is alert and oriented to person, place, and time.  Skin: Skin is warm.    Review of Systems  Constitutional: Negative.   HENT: Negative.   Eyes: Negative.   Respiratory: Negative.   Cardiovascular: Negative.   Gastrointestinal: Negative.   Genitourinary: Negative.   Musculoskeletal: Negative.   Skin: Negative.   Neurological: Negative.   Endo/Heme/Allergies: Negative.      Blood pressure 109/75, pulse 99, temperature 98.4 F (36.9 C), temperature source Oral, resp. rate 16, height  (1.651 m), weight 71.6 kg (157 lb 12.8 oz), last menstrual period 05/16/2017.Body mass index is 26.26 kg/m.  General Appearance: improved grooming   Eye Contact:  Good  Speech:  Normal Rate  Volume:  Normal  Mood:  improving   Affect:  Appropriate and reactive  Thought Process:  Linear and Descriptions of Associations: Intact  Orientation:  Full (Time, Place, and Person)  Thought Content:  WDL  Suicidal Thoughts:  No   Homicidal Thoughts:  No    Memory:  recent and remote  grossly intact   Judgement:  Fair   Insight:  Fair   Psychomotor Activity:  Normal  Concentration:  Concentration: Good and Attention Span: Good  Recall:  Good  Fund of Knowledge:  Good  Language:  Good  Akathisia:  Negative  Handed:  Right  AIMS (if indicated):     Assets:  Communication Skills Desire for Improvement Resilience  ADL's:  Intact  Cognition:  WNL  Sleep:  Number of Hours: 6.25     Treatment Plan Summary: Daily contact with patient to assess and evaluate symptoms and progress in treatment, Medication management and Plan is to:   -Continue to encourage group and milieu participation to work on Pharmacologist. -CSW /team working on disposition planning - of note, mother is legal guardian -Continue Abilify  5 mgrs BID for mood disorder, depression -Continue Trazodone 100 mgrs QHS PRN for insomnia  -Continue  Effexor XR 37.5 mgrs QAM for depression and anxiety -Continue antihypertensive medication management .  -Encourage group therapy participation -Discharge tomorrow to [employer], FNP 05/22/2017, 8:10 PM    Agree with NP Progress Note

## 2017-05-22 NOTE — [facility] Group Notes (Signed)
LCSW Group Therapy Note  05/22/2017 1:15pm  Type of Therapy/Topic:  Group Therapy:  Emotion Regulation  Participation Level:  Active   Description of Group:   The purpose of this group is to assist patients in learning to regulate negative emotions and experience positive emotions. Patients will be guided to discuss ways in which they have been vulnerable to their negative emotions. These vulnerabilities will be juxtaposed with experiences of positive emotions or situations, and patients will be challenged to use positive emotions to combat negative ones. Special emphasis will be placed on coping with negative emotions in conflict situations, and patients will process healthy conflict resolution skills.  Therapeutic Goals: 1. Patient will identify two positive emotions or experiences to reflect on in order to balance out negative emotions 2. Patient will label two or more emotions that they find the most difficult to experience 3. Patient will demonstrate positive conflict resolution skills through discussion and/or role plays  Summary of Patient Progress: Pt participates actively but content is somewhat vague. Pt identifies a warning sign of sadness as "depression." She was able to identify clenching her teeth as a warning sign that she was getting angry.   Therapeutic Modalities:   Cognitive Behavioral Therapy Feelings Identification Dialectical Behavioral Therapy   Verdene Lennert, LCSW 05/22/2017 4:38 PM

## 2017-05-22 NOTE — Progress Notes (Signed)
Patient ID: Brittany Palmer, female   DOB: 1996-06-04, 21 y.o.   MRN: [id number]  DAR: Pt. Denies SI/HI and A/V Hallucinations. She reports sleep is good, appetite is good, energy level is normal, and concentration is good. She rates depression 0/10, hopelessness 0/10, and anxiety 2/10. Patient does not report any pain or discomfort at this time. Support and encouragement provided to the patient. Scheduled medications administered to patient per physician's orders. Patient is receptive and cooperative at this time. She is seen in the milieu at times and interacts with her peers intermittently. Plan of care is continued. Q15 minute checks are maintained for safety.

## 2017-05-22 NOTE — Plan of Care (Signed)
Problem: Health Behavior/Discharge Planning: Goal: Compliance with treatment plan for underlying cause of condition will improve Outcome: Progressing Patient taking medications and attending groups per plan of care. Patient verbalizes understanding and is agreeable to current plan of care.   

## 2017-05-22 NOTE — Progress Notes (Signed)
Nursing Progress Note 1900-0730  D) Patient presents with pleasant mood and bright affect. Patient attended group and was seen interactive in the milieu. Patient denies SI/HI/AVH or pain. Patient contracts for safety on the unit. Patient reports sleeping well with current regimen. Patient compliant with medications. Patient denies concerns for Clinical research associate and Aeronautical engineer with "yes ma'am" and "no ma'am".  A) Emotional support given. 1:1 interaction and active listening provided. Patient medicated as prescribed. Medications and plan of care reviewed with patient. Patient verbalized understanding without further questions. Snacks and fluids provided. Opportunities for questions or concerns presented to patient. Patient encouraged to continue to work on treatment goals. Labs, vital signs and patient behavior monitored throughout shift. Patient safety maintained with q15 min safety checks. Low fall risk precautions in place and reviewed with patient; patient verbalized understanding.  R) Patient receptive to interaction with nurse. Patient remains safe on the unit at this time. Patient denies any adverse medication reactions at this time. Patient is resting in bed without complaints. Will continue to monitor.

## 2017-05-23 MED ORDER — TRAZODONE HCL 100 MG PO TABS: 100.0000 mg | ORAL_TABLET | Freq: Every evening | ORAL | 0 refills | Status: DC | PRN

## 2017-05-23 MED ORDER — PANTOPRAZOLE SODIUM 40 MG PO TBEC: 40.0000 mg | DELAYED_RELEASE_TABLET | Freq: Every day | ORAL | 0 refills | Status: DC

## 2017-05-23 MED ORDER — VENLAFAXINE HCL ER 37.5 MG PO CP24: 37.5000 mg | ORAL_CAPSULE | Freq: Every day | ORAL | 0 refills | Status: DC

## 2017-05-23 MED ORDER — ARIPIPRAZOLE 5 MG PO TABS: 5.0000 mg | ORAL_TABLET | Freq: Two times a day (BID) | ORAL | 0 refills | Status: DC

## 2017-05-23 NOTE — Plan of Care (Signed)
Problem: Physical Regulation: Goal: Ability to maintain clinical measurements within normal limits will improve Outcome: Progressing Patient vital signs obtained at 1700 and were WNL.

## 2017-05-23 NOTE — Progress Notes (Signed)
  [facility] Adult Case Management Discharge Plan :  Will you be returning to the same living situation after discharge:  Yes,  Pt returning to [employer] [facility] At discharge, do you have transportation home?: Yes,  [facility] staff to pick up Do you have the ability to pay for your medications: Yes,  Pt provided with prescriptions  Release of information consent forms completed and in the chart;  Patient's signature needed at discharge.  Patient to Follow up at: Follow-up Information    [facility] Follow up on 05/26/2017.   Why:  at 9:00am for your hospital discharge appointment.  Contact information: [address] [place] [address] [phone]           Next level of care provider has access to [facility] Link:no  Safety Planning and Suicide Prevention discussed: Yes,  with mother; see SPE note  Have you used any form of tobacco in the last 30 days? (Cigarettes, Smokeless Tobacco, Cigars, and/or Pipes): No  Has patient been referred to the Quitline?: N/A patient is not a smoker  Patient has been referred for addiction treatment: N/A  Verdene Lennert, LCSW 05/23/2017, 8:50 AM
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
## 2017-05-23 NOTE — Tx Team (Signed)
Interdisciplinary Treatment and Diagnostic Plan Update  05/23/2017 Time of Session: 9:30am Brittany Palmer MRN: [id number]  Principal Diagnosis: MDD (major depressive disorder), recurrent severe, without psychosis (HCC)  Secondary Diagnoses: Principal Problem:   MDD (major depressive disorder), recurrent severe, without psychosis (HCC) Active Problems:   Autism spectrum disorder   Current Medications:  Current Facility-Administered Medications  Medication Dose Route Frequency Provider Last Rate Last Dose  . acetaminophen (TYLENOL) tablet 650 mg  650 mg Oral Q6H PRN Rankin, Shuvon B, NP   650 mg at 05/23/17 0837  . alum & mag hydroxide-simeth (MAALOX/MYLANTA) 200-200-20 MG/5ML suspension 30 mL  30 mL Oral Q4H PRN Rankin, Shuvon B, NP   30 mL at 05/22/17 2102  . ARIPiprazole (ABILIFY) tablet 5 mg  5 mg Oral BID Money, Gerlene Burdock, FNP   5 mg at 05/23/17 4098  . hydrochlorothiazide (MICROZIDE) capsule 12.5 mg  12.5 mg Oral Daily Money, Gerlene Burdock, FNP   12.5 mg at 05/23/17 0836  . hydrOXYzine (ATARAX/VISTARIL) tablet 25 mg  25 mg Oral TID PRN Nira Conn A, NP   25 mg at 05/21/17 1316  . losartan (COZAAR) tablet 50 mg  50 mg Oral Daily Money, Gerlene Burdock, FNP   50 mg at 05/23/17 0835  . magnesium hydroxide (MILK OF MAGNESIA) suspension 30 mL  30 mL Oral Daily PRN Rankin, Shuvon B, NP      . ondansetron (ZOFRAN-ODT) disintegrating tablet 4 mg  4 mg Oral Q8H PRN Rankin, Shuvon B, NP   4 mg at 05/18/17 0935  . pantoprazole (PROTONIX) EC tablet 40 mg  40 mg Oral Daily Money, Gerlene Burdock, FNP   40 mg at 05/23/17 0835  . traZODone (DESYREL) tablet 100 mg  100 mg Oral QHS PRN Cobos, Rockey Situ, MD   100 mg at 05/21/17 2158  . venlafaxine XR (EFFEXOR-XR) 24 hr capsule 37.5 mg  37.5 mg Oral Q breakfast Cobos, Rockey Situ, MD   37.5 mg at 05/23/17 0835  . verapamil (CALAN-SR) CR tablet 240 mg  240 mg Oral Daily Money, Gerlene Burdock, [place]   240 mg at 05/23/17 1191    PTA Medications: Prescriptions Prior to Admission   Medication Sig Dispense Refill Last Dose  . albuterol (PROVENTIL HFA;VENTOLIN HFA) 108 (90 Base) MCG/ACT inhaler Inhale into the lungs every 6 (six) hours as needed for wheezing or shortness of breath.   Past Month at Unknown time  . ARIPiprazole (ABILIFY) 5 MG tablet Take 5 mg by mouth daily.   Past Week at Unknown time  . benztropine (COGENTIN) 1 MG tablet Take 1 mg by mouth 2 (two) times daily.   Past Week at Unknown time  . hydrochlorothiazide (MICROZIDE) 12.5 MG capsule Take 12.5 mg by mouth daily.   Past Week at Unknown time  . LOSARTAN POTASSIUM PO Take 1 tablet by mouth daily.   Past Week at Unknown time  . sertraline (ZOLOFT) 100 MG tablet Take 100 mg by mouth daily.   Past Week at Unknown time  . verapamil (CALAN-SR) 240 MG CR tablet Take 240 mg by mouth at bedtime.   Past Week at Unknown time    Treatment Modalities: Medication Management, Group therapy, Case management,  1 to 1 session with clinician, Psychoeducation, Recreational therapy.  Patient Stressors: Loss of sister   Patient Strengths: General fund of knowledge Supportive family/friends  Physician Treatment Plan for Primary Diagnosis: MDD (major depressive disorder), recurrent severe, without psychosis (HCC) Long Term Goal(s): Improvement in symptoms so as ready for  discharge  Short Term Goals: Ability to verbalize feelings will improve Ability to disclose and discuss suicidal ideas Ability to maintain clinical measurements within normal limits will improve Compliance with prescribed medications will improve  Medication Management: Evaluate patient's response, side effects, and tolerance of medication regimen.  Therapeutic Interventions: 1 to 1 sessions, Unit Group sessions and Medication administration.  Evaluation of Outcomes: Adequate for Discharge  Physician Treatment Plan for Secondary Diagnosis: Principal Problem:   MDD (major depressive disorder), recurrent severe, without psychosis (HCC) Active  Problems:   Autism spectrum disorder   Long Term Goal(s): Improvement in symptoms so as ready for discharge  Short Term Goals: Ability to verbalize feelings will improve Ability to disclose and discuss suicidal ideas Ability to maintain clinical measurements within normal limits will improve Compliance with prescribed medications will improve  Medication Management: Evaluate patient's response, side effects, and tolerance of medication regimen.  Therapeutic Interventions: 1 to 1 sessions, Unit Group sessions and Medication administration.  Evaluation of Outcomes: Adequate for Discharge   RN Treatment Plan for Primary Diagnosis: MDD (major depressive disorder), recurrent severe, without psychosis (HCC) Long Term Goal(s): Knowledge of disease and therapeutic regimen to maintain health will improve  Short Term Goals: Ability to disclose and discuss suicidal ideas, Ability to identify and develop effective coping behaviors will improve and Compliance with prescribed medications will improve  Medication Management: RN will administer medications as ordered by provider, will assess and evaluate patient's response and provide education to patient for prescribed medication. RN will report any adverse and/or side effects to prescribing provider.  Therapeutic Interventions: 1 on 1 counseling sessions, Psychoeducation, Medication administration, Evaluate responses to treatment, Monitor vital signs and CBGs as ordered, Perform/monitor CIWA, COWS, AIMS and Fall Risk screenings as ordered, Perform wound care treatments as ordered.  Evaluation of Outcomes: Adequate for Discharge   LCSW Treatment Plan for Primary Diagnosis: MDD (major depressive disorder), recurrent severe, without psychosis (HCC) Long Term Goal(s): Safe transition to appropriate next level of care at discharge, Engage patient in therapeutic group addressing interpersonal concerns.  Short Term Goals: Engage patient in aftercare  planning with referrals and resources, Identify triggers associated with mental health/substance abuse issues and Increase skills for wellness and recovery  Therapeutic Interventions: Assess for all discharge needs, 1 to 1 time with Social worker, Explore available resources and support systems, Assess for adequacy in community support network, Educate family and significant other(s) on suicide prevention, Complete Psychosocial Assessment, Interpersonal group therapy.  Evaluation of Outcomes: Adequate for Discharge   Progress in Treatment: Attending groups: Yes Participating in groups: Yes Taking medication as prescribed: Yes, MD continues to assess for medication changes as needed Toleration medication: Yes, no side effects reported at this time Family/Significant other contact made: Yes with mother Patient understands diagnosis: Continuing to assess Discussing patient identified problems/goals with staff: Yes Medical problems stabilized or resolved: Yes Denies suicidal/homicidal ideation: Yes Issues/concerns per patient self-inventory: None Other: N/A  New problem(s) identified: None identified at this time.   New Short Term/Long Term Goal(s): None identified at this time.   Discharge Plan or Barriers: Pt will return to [facility] and follow-up at [facility] in [place]  Reason for Continuation of Hospitalization: None identified at this time.   Estimated Length of Stay: 0 days; Pt stable for DC today  Attendees: Patient:  05/23/2017  8:51 AM  Physician: Dr. Jama Flavors, MD 05/23/2017  8:51 AM  Nursing: Lincoln Maxin RN 05/23/2017  8:51 AM  RN Care Manager: Onnie Boer, RN 05/23/2017  8:51 AM  Social Worker: Vernie Shanks, LCSW 05/23/2017  8:51 AM  Recreational Therapist:  05/23/2017  8:51 AM  Other: Armandina Stammer, NP; Reola Calkins, NP; Hillery Jacks, NP 05/23/2017  8:51 AM  Other:  05/23/2017  8:51 AM  Other: 05/23/2017  8:51 AM    Scribe for Treatment Team: Verdene Lennert,  LCSW 05/23/2017 8:51 AM
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
## 2017-05-23 NOTE — Progress Notes (Signed)
Recreation Therapy Notes  Date: 05/23/17 Time: 0930 Location: 300 Hall Group Room  Group Topic: Stress Management  Goal Area(s) Addresses:  Patient will verbalize importance of using healthy stress management.  Patient will identify positive emotions associated with healthy stress management.   Intervention: Stress Management  Activity :  Guided Imagery.  LRT introduced the stress management technique of guided imagery to patients.  LRT read a script that allowed patients to embark on a mental vacation to relax and get away from their everyday routine.  Patients were to follow along as the script was read to participate in the activity.  Education:  Stress Management, Discharge Planning.   Education Outcome: Acknowledges edcuation/In group clarification offered/Needs additional education  Clinical Observations/Feedback: Pt did not attend group.   Caroll Rancher, LRT/CTRS         Caroll Rancher A 05/23/2017 11:32 AM

## 2017-05-23 NOTE — [facility] Suicide Risk Assessment (Signed)
[facility] Discharge Suicide Risk Assessment   Principal Problem: MDD (major depressive disorder), recurrent severe, without psychosis (HCC) Discharge Diagnoses:  Patient Active Problem List   Diagnosis Date Noted  . Autism spectrum disorder [F84.0] 05/17/2017  . MDD (major depressive disorder), recurrent severe, without psychosis (HCC) [F33.2] 05/16/2017    Total Time spent with patient: 30 minutes  Musculoskeletal: Strength & Muscle Tone: within normal limits Gait & Station: normal Patient leans: N/A  Psychiatric Specialty Exam: ROS mild headache, no chest pain, no shortness of breath, no vomiting, reports ongoing loose stools,  no rash  Blood pressure 116/68, pulse (!) 102, temperature 99.3 F (37.4 C), temperature source Oral, resp. rate 16, height  (1.651 m), weight 71.6 kg (157 lb 12.8 oz), last menstrual period 05/16/2017.Body mass index is 26.26 kg/m.  General Appearance: improved grooming  Eye Contact::  Good  Speech:  Normal Rate409  Volume:  Normal  Mood:  mood is improved, denies feeling depressed   Affect:  appropriate, reactive   Thought Process:  Linear and Descriptions of Associations: Intact  Orientation:  Other:  fully alert and attentive  Thought Content:  no hallucinations, no delusions  Suicidal Thoughts:  No denies suicidal or self injurious ideations, denies any homicidal or violent ideations  Homicidal Thoughts:  No  Memory:  recent and remote grossly intact   Judgement:  Other:  improving   Insight:  fair- improving   Psychomotor Activity:  Normal  Concentration:  Good  Recall:  Good  Fund of Knowledge:Good  Language: Good  Akathisia:  Negative  Handed:  Right  AIMS (if indicated):     Assets:  Communication Skills Desire for Improvement Social Support  Sleep:  Number of Hours: 6.75  Cognition: WNL  ADL's:  Intact   Mental Status Per Nursing Assessment::   On Admission:  Suicidal ideation indicated by patient  Demographic Factors:  21  year old single female , lives at [facility], mother is legal guardian    Loss Factors: No acute stressors, lives at [employer] [facility] and most clients there are younger   Historical Factors: History of depression, history of Autism Spectrum Disorder  Risk Reduction Factors:   Sense of responsibility to family, Living with another person, especially a relative and Positive coping skills or problem solving skills  Continued Clinical Symptoms:  At this time patient presents alert, attentive, calm, denies depression, states mood is " good", affect is reactive, smiles often during session, no thought disorder, no suicidal ideations, no self injurious ideations, denies any homicidal ideations, future oriented. States " I still want to get into a group home but I am good with staying at [facility] for a while longer until I can get into one". Denies medication side effects, other than mild loose stools which may be related to SSRI trial. On unit has been calm, behavior in good control, pleasant on approach.  Cognitive Features That Contribute To Risk:  No gross cognitive deficits noted upon discharge. Is alert , attentive, and oriented x 3   Suicide Risk:  Mild:  Suicidal ideation of limited frequency, intensity, duration, and specificity.  There are no identifiable plans, no associated intent, mild dysphoria and related symptoms, good self-control (both objective and subjective assessment), few other risk factors, and identifiable protective factors, including available and accessible social support.  Follow-up Information    [facility], [facility] Follow up on 05/26/2017.   Why:  at 9:00am for your hospital discharge appointment.  Contact information: [address] [place] [address] [phone]           Plan Of Care/Follow-up recommendations:  Activity:  as tolerated  Diet:  Regular Tests:  NA Other:  See below Patient is planning on returning to [facility] Follow up as above Craige Cotta, MD 05/23/2017, 8:56 AM

## 2017-05-23 NOTE — Progress Notes (Signed)
Nursing Progress Note 1900-0730  D) Patient presents calm, pleasant and cooperative. Patient is seen interactive in the milieu and talking with peers in the dayroom. Patient denies SI/HI/AVH or pain. Patient contracts for safety on the unit. Patient requested Maalox for indigestion and gas this evening with relief. Patient requested trazodone to be taken at 2200. At 2200, Clinical research associate was with another patient. When writer became available to administer medication, patient already in bed asleep. Patient not awakened for medication but will provide if patient awakens/requests.  A) Emotional support given. 1:1 interaction and active listening provided. Medications and plan of care reviewed with patient. Patient verbalized understanding without further questions. Snacks and fluids provided. Opportunities for questions or concerns presented to patient. Patient encouraged to continue to work on treatment goals. Labs, vital signs and patient behavior monitored throughout shift. Patient safety maintained with q15 min safety checks. Low fall risk precautions in place and reviewed with patient; patient verbalized understanding.  R) Patient receptive to interaction with nurse. Patient remains safe on the unit at this time. Patient currently in bed asleep. Will continue to monitor.

## 2017-05-23 NOTE — Progress Notes (Signed)
D: Pt A & O X4. Denies SI, HI, AVH and pain at this time. Presents animated, pleasant, appears to be in no physical distress. Ambulatory with a steady gait. Pt d/c home as ordered.  A: All medications administered as prescribed with verbal education and effects monitored. D/C instructions reviewed with pt including prescriptions and follow up appointments. All belongings in locker 44 returned to pt at time of departure. Q 15 minutes safety checks maintained till time of d/c without self harm gestures or outburst to note thus far.  R: Pt receptive to care. Verbalized understanding related to d/c instructions. Signed belonging sheet in agreement with items received. Cooperative with d/c procedure. Pt was picked up in the lobby by Ms. Lawson Fiscal "my dorm mom".

## 2017-06-02 DIAGNOSIS — F329 Major depressive disorder, single episode, unspecified: Secondary | ICD-10-CM | POA: Diagnosis not present

## 2017-06-05 DIAGNOSIS — Z6825 Body mass index (BMI) 25.0-25.9, adult: Secondary | ICD-10-CM | POA: Diagnosis not present

## 2017-06-05 DIAGNOSIS — R350 Frequency of micturition: Secondary | ICD-10-CM | POA: Diagnosis not present

## 2017-06-05 DIAGNOSIS — R45851 Suicidal ideations: Secondary | ICD-10-CM | POA: Diagnosis not present

## 2017-07-22 DIAGNOSIS — F329 Major depressive disorder, single episode, unspecified: Secondary | ICD-10-CM | POA: Diagnosis not present

## 2017-07-27 DIAGNOSIS — I10 Essential (primary) hypertension: Secondary | ICD-10-CM | POA: Diagnosis not present

## 2017-07-27 DIAGNOSIS — Z79899 Other long term (current) drug therapy: Secondary | ICD-10-CM | POA: Diagnosis not present

## 2017-07-27 DIAGNOSIS — K219 Gastro-esophageal reflux disease without esophagitis: Secondary | ICD-10-CM | POA: Diagnosis not present

## 2017-07-27 DIAGNOSIS — F489 Nonpsychotic mental disorder, unspecified: Secondary | ICD-10-CM | POA: Diagnosis not present

## 2017-07-27 DIAGNOSIS — E876 Hypokalemia: Secondary | ICD-10-CM | POA: Diagnosis not present

## 2017-07-27 DIAGNOSIS — R45851 Suicidal ideations: Secondary | ICD-10-CM | POA: Diagnosis not present

## 2017-07-27 DIAGNOSIS — Z915 Personal history of self-harm: Secondary | ICD-10-CM | POA: Diagnosis not present

## 2017-07-27 DIAGNOSIS — Z008 Encounter for other general examination: Secondary | ICD-10-CM | POA: Diagnosis not present

## 2017-07-29 DIAGNOSIS — F840 Autistic disorder: Secondary | ICD-10-CM | POA: Diagnosis not present

## 2017-07-29 DIAGNOSIS — Z915 Personal history of self-harm: Secondary | ICD-10-CM | POA: Diagnosis not present

## 2017-07-29 DIAGNOSIS — F489 Nonpsychotic mental disorder, unspecified: Secondary | ICD-10-CM | POA: Diagnosis not present

## 2017-07-29 DIAGNOSIS — K219 Gastro-esophageal reflux disease without esophagitis: Secondary | ICD-10-CM | POA: Diagnosis not present

## 2017-07-29 DIAGNOSIS — R45851 Suicidal ideations: Secondary | ICD-10-CM | POA: Diagnosis not present

## 2017-07-29 DIAGNOSIS — F603 Borderline personality disorder: Secondary | ICD-10-CM | POA: Diagnosis not present

## 2017-07-29 DIAGNOSIS — Z008 Encounter for other general examination: Secondary | ICD-10-CM | POA: Diagnosis not present

## 2017-07-29 DIAGNOSIS — E876 Hypokalemia: Secondary | ICD-10-CM | POA: Diagnosis not present

## 2017-07-29 DIAGNOSIS — I10 Essential (primary) hypertension: Secondary | ICD-10-CM | POA: Diagnosis not present

## 2017-07-29 DIAGNOSIS — F332 Major depressive disorder, recurrent severe without psychotic features: Secondary | ICD-10-CM | POA: Diagnosis not present

## 2017-07-29 DIAGNOSIS — Z79899 Other long term (current) drug therapy: Secondary | ICD-10-CM | POA: Diagnosis not present

## 2017-07-30 DIAGNOSIS — F332 Major depressive disorder, recurrent severe without psychotic features: Secondary | ICD-10-CM | POA: Diagnosis not present

## 2017-08-02 DIAGNOSIS — F332 Major depressive disorder, recurrent severe without psychotic features: Secondary | ICD-10-CM | POA: Diagnosis not present

## 2017-08-03 DIAGNOSIS — F332 Major depressive disorder, recurrent severe without psychotic features: Secondary | ICD-10-CM | POA: Diagnosis not present

## 2017-08-04 DIAGNOSIS — F332 Major depressive disorder, recurrent severe without psychotic features: Secondary | ICD-10-CM | POA: Diagnosis not present

## 2017-08-05 DIAGNOSIS — F332 Major depressive disorder, recurrent severe without psychotic features: Secondary | ICD-10-CM | POA: Diagnosis not present

## 2017-08-06 DIAGNOSIS — F332 Major depressive disorder, recurrent severe without psychotic features: Secondary | ICD-10-CM | POA: Diagnosis not present

## 2017-08-18 DIAGNOSIS — R45851 Suicidal ideations: Secondary | ICD-10-CM | POA: Diagnosis not present

## 2017-08-18 DIAGNOSIS — K219 Gastro-esophageal reflux disease without esophagitis: Secondary | ICD-10-CM | POA: Diagnosis not present

## 2017-08-18 DIAGNOSIS — X788XXA Intentional self-harm by other sharp object, initial encounter: Secondary | ICD-10-CM | POA: Diagnosis not present

## 2017-08-18 DIAGNOSIS — F840 Autistic disorder: Secondary | ICD-10-CM | POA: Diagnosis not present

## 2017-08-18 DIAGNOSIS — S60811A Abrasion of right wrist, initial encounter: Secondary | ICD-10-CM | POA: Diagnosis not present

## 2017-08-18 DIAGNOSIS — Z008 Encounter for other general examination: Secondary | ICD-10-CM | POA: Diagnosis not present

## 2017-08-18 DIAGNOSIS — X58XXXA Exposure to other specified factors, initial encounter: Secondary | ICD-10-CM | POA: Diagnosis not present

## 2017-08-18 DIAGNOSIS — S60819A Abrasion of unspecified wrist, initial encounter: Secondary | ICD-10-CM | POA: Diagnosis not present

## 2017-08-18 DIAGNOSIS — Z79899 Other long term (current) drug therapy: Secondary | ICD-10-CM | POA: Diagnosis not present

## 2017-08-18 DIAGNOSIS — S60812A Abrasion of left wrist, initial encounter: Secondary | ICD-10-CM | POA: Diagnosis not present

## 2017-08-18 DIAGNOSIS — I10 Essential (primary) hypertension: Secondary | ICD-10-CM | POA: Diagnosis not present

## 2017-08-18 DIAGNOSIS — R0602 Shortness of breath: Secondary | ICD-10-CM | POA: Diagnosis not present

## 2017-08-19 ENCOUNTER — Other Ambulatory Visit: Payer: Self-pay

## 2017-08-19 ENCOUNTER — Inpatient Hospital Stay (HOSPITAL_COMMUNITY)
Admission: AD | Admit: 2017-08-19 | Discharge: 2017-08-28 | DRG: 885 | Disposition: A | Discharge status: Home / Self Care | Payer: BLUE CROSS/BLUE SHIELD | Source: Intra-hospital | Attending: Psychiatry | Admitting: Psychiatry

## 2017-08-19 ENCOUNTER — Encounter (HOSPITAL_COMMUNITY): Payer: Self-pay | Admitting: *Deleted

## 2017-08-19 DIAGNOSIS — Z888 Allergy status to other drugs, medicaments and biological substances status: Secondary | ICD-10-CM

## 2017-08-19 DIAGNOSIS — X58XXXA Exposure to other specified factors, initial encounter: Secondary | ICD-10-CM | POA: Diagnosis not present

## 2017-08-19 DIAGNOSIS — F840 Autistic disorder: Secondary | ICD-10-CM | POA: Diagnosis present

## 2017-08-19 DIAGNOSIS — F603 Borderline personality disorder: Secondary | ICD-10-CM | POA: Diagnosis not present

## 2017-08-19 DIAGNOSIS — X788XXA Intentional self-harm by other sharp object, initial encounter: Secondary | ICD-10-CM | POA: Diagnosis not present

## 2017-08-19 DIAGNOSIS — R45851 Suicidal ideations: Secondary | ICD-10-CM | POA: Diagnosis present

## 2017-08-19 DIAGNOSIS — R0602 Shortness of breath: Secondary | ICD-10-CM | POA: Diagnosis not present

## 2017-08-19 DIAGNOSIS — Z818 Family history of other mental and behavioral disorders: Secondary | ICD-10-CM | POA: Diagnosis not present

## 2017-08-19 DIAGNOSIS — S60812A Abrasion of left wrist, initial encounter: Secondary | ICD-10-CM | POA: Diagnosis not present

## 2017-08-19 DIAGNOSIS — F332 Major depressive disorder, recurrent severe without psychotic features: Secondary | ICD-10-CM | POA: Diagnosis not present

## 2017-08-19 DIAGNOSIS — F39 Unspecified mood [affective] disorder: Secondary | ICD-10-CM | POA: Diagnosis not present

## 2017-08-19 DIAGNOSIS — I10 Essential (primary) hypertension: Secondary | ICD-10-CM | POA: Diagnosis present

## 2017-08-19 DIAGNOSIS — F329 Major depressive disorder, single episode, unspecified: Secondary | ICD-10-CM | POA: Insufficient documentation

## 2017-08-19 DIAGNOSIS — Z79899 Other long term (current) drug therapy: Secondary | ICD-10-CM | POA: Diagnosis not present

## 2017-08-19 DIAGNOSIS — Z7951 Long term (current) use of inhaled steroids: Secondary | ICD-10-CM

## 2017-08-19 DIAGNOSIS — S60811A Abrasion of right wrist, initial encounter: Secondary | ICD-10-CM | POA: Diagnosis not present

## 2017-08-19 DIAGNOSIS — G4700 Insomnia, unspecified: Secondary | ICD-10-CM | POA: Diagnosis not present

## 2017-08-19 DIAGNOSIS — F419 Anxiety disorder, unspecified: Secondary | ICD-10-CM | POA: Diagnosis not present

## 2017-08-19 DIAGNOSIS — K219 Gastro-esophageal reflux disease without esophagitis: Secondary | ICD-10-CM | POA: Diagnosis not present

## 2017-08-19 DIAGNOSIS — Z008 Encounter for other general examination: Secondary | ICD-10-CM | POA: Diagnosis not present

## 2017-08-19 MED ORDER — VENLAFAXINE HCL ER 37.5 MG PO CP24
37.5000 mg | ORAL_CAPSULE | Freq: Every day | ORAL | Status: DC
  Administered 2017-08-20 – 2017-08-21 (×2): 37.5 mg via ORAL
  Filled 2017-08-19 (×4): qty 1

## 2017-08-19 MED ORDER — MAGNESIUM HYDROXIDE 400 MG/5ML PO SUSP: 30.0000 mL | Freq: Every day | ORAL | Status: DC | PRN

## 2017-08-19 MED ORDER — ARIPIPRAZOLE 5 MG PO TABS
5.0000 mg | ORAL_TABLET | Freq: Two times a day (BID) | ORAL | Status: DC
  Administered 2017-08-19 – 2017-08-20 (×3): 5 mg via ORAL
  Filled 2017-08-19 (×6): qty 1

## 2017-08-19 MED ORDER — ALBUTEROL SULFATE HFA 108 (90 BASE) MCG/ACT IN AERS
2.0000 | INHALATION_SPRAY | Freq: Four times a day (QID) | RESPIRATORY_TRACT | Status: DC
  Administered 2017-08-19 – 2017-08-20 (×5): 2 via RESPIRATORY_TRACT
  Filled 2017-08-19: qty 6.7

## 2017-08-19 MED ORDER — HYDROCHLOROTHIAZIDE 12.5 MG PO CAPS
12.5000 mg | ORAL_CAPSULE | Freq: Every day | ORAL | Status: DC
  Administered 2017-08-19 – 2017-08-28 (×10): 12.5 mg via ORAL
  Filled 2017-08-19 (×14): qty 1

## 2017-08-19 MED ORDER — BACITRACIN-NEOMYCIN-POLYMYXIN OINTMENT TUBE
TOPICAL_OINTMENT | Freq: Two times a day (BID) | CUTANEOUS | Status: DC
  Administered 2017-08-19 – 2017-08-21 (×5): via TOPICAL
  Administered 2017-08-22: 1 via TOPICAL
  Administered 2017-08-22 – 2017-08-27 (×6): via TOPICAL
  Filled 2017-08-19: qty 14.17

## 2017-08-19 MED ORDER — BACITRACIN-NEOMYCIN-POLYMYXIN 400-5-5000 EX OINT
TOPICAL_OINTMENT | CUTANEOUS | Status: AC
  Filled 2017-08-19: qty 2

## 2017-08-19 MED ORDER — TRAZODONE HCL 100 MG PO TABS
100.0000 mg | ORAL_TABLET | Freq: Every evening | ORAL | Status: DC | PRN
  Administered 2017-08-19 – 2017-08-27 (×9): 100 mg via ORAL
  Filled 2017-08-19 (×9): qty 1

## 2017-08-19 MED ORDER — PANTOPRAZOLE SODIUM 40 MG PO TBEC
40.0000 mg | DELAYED_RELEASE_TABLET | Freq: Every day | ORAL | Status: DC
  Administered 2017-08-20 – 2017-08-28 (×9): 40 mg via ORAL
  Filled 2017-08-19 (×13): qty 1

## 2017-08-19 MED ORDER — ACETAMINOPHEN 325 MG PO TABS
650.0000 mg | ORAL_TABLET | Freq: Four times a day (QID) | ORAL | Status: DC | PRN
  Administered 2017-08-19 – 2017-08-28 (×3): 650 mg via ORAL
  Filled 2017-08-19 (×3): qty 2

## 2017-08-19 MED ORDER — ALUM & MAG HYDROXIDE-SIMETH 200-200-20 MG/5ML PO SUSP: 30.0000 mL | ORAL | Status: DC | PRN

## 2017-08-19 MED ORDER — VERAPAMIL HCL ER 240 MG PO TBCR
240.0000 mg | EXTENDED_RELEASE_TABLET | Freq: Every day | ORAL | Status: DC
  Administered 2017-08-19 – 2017-08-27 (×9): 240 mg via ORAL
  Filled 2017-08-19 (×11): qty 1
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
## 2017-08-19 NOTE — Progress Notes (Signed)
D: Pt was in the dayroom upon initial approach.  Pt presents with depressed affect and mood.  When asked how her day was, she states it has "been a piece of shit."  Her goal is "to feel better."  Pt reports thoughts of harming self, stating "if I had a knife, I'd stab myself."  She reports thoughts of harming "somebody from the group home."  Pt verbally contracts for safety.  Pt is respectful towards peers and staff tonight.  She denies hallucinations and pain.  Pt attended evening group.    A: Introduced self to pt.  Actively listened to pt and offered support and encouragement. Medications administered per order.  PRN medication administered for sleep per request.  Q15 minute safety checks maintained.  R: Pt is safe on the unit.  Pt is compliant with medications.  Pt verbally contracts for safety.  Will continue to monitor and assess.
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
## 2017-08-19 NOTE — Plan of Care (Signed)
  Progressing Education: Emotional status will improve 08/19/2017 1607 - Progressing by Angela AdamBeaudry, Jeriah Skufca E, RN Mental status will improve 08/19/2017 1607 - Progressing by Angela AdamBeaudry, Jayli Fogleman E, RN

## 2017-08-19 NOTE — Plan of Care (Signed)
  Progressing Safety: Periods of time without injury will increase 08/19/2017 2304 - Progressing by Arrie Aranhurch, Lorne Winkels J, RN Note Pt has not harmed self or others tonight.  She endorses thoughts of self-harm and thoughts of harming "somebody from the group home."  Pt verbally contracts for safety.

## 2017-08-19 NOTE — Progress Notes (Signed)
Adult Psychoeducational Group Note  Date:  08/19/2017 Time:  8:56 PM  Group Topic/Focus:  Wrap-Up Group:   The focus of this group is to help patients review their daily goal of treatment and discuss progress on daily workbooks.  Participation Level:  Active  Participation Quality:  Appropriate  Affect:  Appropriate  Cognitive:  Alert  Insight: Good  Engagement in Group:  Engaged  Modes of Intervention:  Activity  Additional Comments:  Patient rated her day a 1 and goal is to be happier and have a better day tomorrow.  Natasha MeadKiara M Dinesh Ulysse 08/19/2017, 8:56 PM

## 2017-08-20 DIAGNOSIS — Z818 Family history of other mental and behavioral disorders: Secondary | ICD-10-CM

## 2017-08-20 DIAGNOSIS — F840 Autistic disorder: Secondary | ICD-10-CM

## 2017-08-20 DIAGNOSIS — K219 Gastro-esophageal reflux disease without esophagitis: Secondary | ICD-10-CM

## 2017-08-20 DIAGNOSIS — F332 Major depressive disorder, recurrent severe without psychotic features: Principal | ICD-10-CM

## 2017-08-20 DIAGNOSIS — I10 Essential (primary) hypertension: Secondary | ICD-10-CM

## 2017-08-20 MED ORDER — HYDROXYZINE HCL 25 MG PO TABS
25.0000 mg | ORAL_TABLET | Freq: Three times a day (TID) | ORAL | Status: DC | PRN
  Administered 2017-08-20 – 2017-08-26 (×7): 25 mg via ORAL
  Filled 2017-08-20 (×7): qty 1

## 2017-08-20 MED ORDER — ALBUTEROL SULFATE HFA 108 (90 BASE) MCG/ACT IN AERS
2.0000 | INHALATION_SPRAY | Freq: Four times a day (QID) | RESPIRATORY_TRACT | Status: DC | PRN
  Administered 2017-08-22 – 2017-08-27 (×7): 2 via RESPIRATORY_TRACT

## 2017-08-20 MED ORDER — ARIPIPRAZOLE 15 MG PO TABS
15.0000 mg | ORAL_TABLET | Freq: Every day | ORAL | Status: DC
  Administered 2017-08-21: 15 mg via ORAL
  Filled 2017-08-20 (×3): qty 1

## 2017-08-20 NOTE — Progress Notes (Signed)
CSW attempted to call legal guardian/mother Althea CharonDana Anthes to inform her of pt admission.  Ms. Olena LeatherwoodJarrett did not answer and the mailbox was full.  CSW unable to leave message. Garner NashGregory Lometa Riggin, MSW, LCSW Clinical Social Worker 08/20/2017 3:48 PM
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
## 2017-08-20 NOTE — H&P (Signed)
Psychiatric Admission Assessment Adult  Patient Identification: Brittany JulyMacy Palmer MRN:  [id number] Date of Evaluation:  08/20/2017 Chief Complaint: Suicidal behavior Principal Diagnosis: MDD                                        ASD Diagnosis:   Patient Active Problem List   Diagnosis Date Noted  . MDD (major depressive disorder) [F32.9] 08/19/2017  . Autism spectrum disorder [F84.0] 05/17/2017  . MDD (major depressive disorder), recurrent severe, without psychosis (HCC) [F33.2] 05/16/2017   History of Present Illness:  21 y.o Caucasian female, single, in residential care. History of Autistic Spectrum Disorder. Presented to the hospital via emergency services. Patient was agitated at her group home. She was reported to have been threatening staff and peers at her group home. She scratched both hands with her nails as she was very upset. She expressed suicidal thoughts at her group home.  Patient has a legal guardian. She has been adherent with her medications. At interview, she tells me that some other girls at the home criticized her. They felt she was seeking excessive attention. Says that got her very upset. Says she felt like hurting them. Patient says her behavior had consequences at the group home. Says her cell phone and sunglasses was taken away. That made her more upset with staff. Patient says she is still mad at them. Says she has not been eating much. She feels she can no longer go back there. Very focused on finding a new group home. Patient denies any hallucinations. She denies any feelings of external influence. She has no violent thoughts towards these people here. Says she sometimes feels like hurting herself as she is still mad with them. No substance use.  No access to weapons.   Total Time spent with patient: 1 hour  Past Psychiatric History: ASD with [employer]. Patient was recently discharged from our unit. Past history of deliberate self harming behavior.   Is the  patient at risk to self? Yes.    Has the patient been a risk to self in the past 6 months? Yes.    Has the patient been a risk to self within the distant past? Yes.    Is the patient a risk to others? No.  Has the patient been a risk to others in the past 6 months? No.  Has the patient been a risk to others within the distant past? No.   Prior Inpatient Therapy: Prior Inpatient Therapy: Yes Prior Therapy Dates: Novmeber 2018 Prior Therapy Facilty/Provider(s): [facility] Reason for Treatment: MDD Prior Outpatient Therapy: Prior Outpatient Therapy: Yes Prior Therapy Dates: ongoing Prior Therapy Facilty/Provider(s): [facility] Reason for Treatment: depression Does patient have an ACCT team?: No Does patient have Intensive In-House Services?  : No Does patient have [facility] services? : No Does patient have P4CC services?: No  Alcohol Screening: 1. How often do you have a drink containing alcohol?: Never 2. How many drinks containing alcohol do you have on a typical day when you are drinking?: 1 or 2 3. How often do you have six or more drinks on one occasion?: Never AUDIT-C Score: 0 9. Have you or someone else been injured as a result of your drinking?: No 10. Has a relative or friend or a doctor or another health worker been concerned about your drinking or suggested you cut down?: No Alcohol Use Disorder Identification Test Final  Score (AUDIT): 0 Intervention/Follow-up: AUDIT Score <7 follow-up not indicated Substance Abuse History in the last 12 months:  No. Consequences of Substance Abuse: NA Previous Psychotropic Medications: Yes  Psychological Evaluations: Yes  Past Medical History:  Past Medical History:  Diagnosis Date  . Autism   . GERD (gastroesophageal reflux disease)   . Hypertension     Past Surgical History:  Procedure Laterality Date  . TONSILLECTOMY     Family History: History reviewed. No pertinent family history. Family Psychiatric  History: Sister committed  suicide in March this year.  Tobacco Screening: Have you used any form of tobacco in the last 30 days? (Cigarettes, Smokeless Tobacco, Cigars, and/or Pipes): No Social History:  Social History   Substance and Sexual Activity  Alcohol Use No     Social History   Substance and Sexual Activity  Drug Use No    Additional Social History: Marital status: Single                         Allergies:   Allergies  Allergen Reactions  . Lisinopril Cough  . Risperdal [Risperidone] Other (See Comments)    Twitching of mouth    Lab Results: No results found for this or any previous visit (from the past 48 hour(s)).  Blood Alcohol level:  No results found for: [facility]  Metabolic Disorder Labs:  Lab Results  Component Value Date   HGBA1C 5.8 (H) 04/12/2011   MPG 120 (H) 04/12/2011   Lab Results  Component Value Date   PROLACTIN 30.2 04/12/2011   Lab Results  Component Value Date   CHOL 115 04/12/2011   TRIG 58 04/12/2011   HDL 47 04/12/2011   CHOLHDL 2.4 04/12/2011   VLDL 12 04/12/2011   LDLCALC 56 04/12/2011    Current Medications: Current Facility-Administered Medications  Medication Dose Route Frequency Provider Last Rate Last Dose  . acetaminophen (TYLENOL) tablet 650 mg  650 mg Oral Q6H PRN Rankin, Shuvon B, NP   650 mg at 08/19/17 1449  . albuterol (PROVENTIL HFA;VENTOLIN HFA) 108 (90 Base) MCG/ACT inhaler 2 puff  2 puff Inhalation Q6H Rankin, Shuvon B, NP   2 puff at 08/20/17 1522  . alum & mag hydroxide-simeth (MAALOX/MYLANTA) 200-200-20 MG/5ML suspension 30 mL  30 mL Oral Q4H PRN Rankin, Shuvon B, NP      . ARIPiprazole (ABILIFY) tablet 5 mg  5 mg Oral BID Rankin, Shuvon B, NP   5 mg at 08/20/17 0809  . hydrochlorothiazide (MICROZIDE) capsule 12.5 mg  12.5 mg Oral Daily Rankin, Shuvon B, NP   12.5 mg at 08/20/17 0810  . magnesium hydroxide (MILK OF MAGNESIA) suspension 30 mL  30 mL Oral Daily PRN Rankin, Shuvon B, NP      . neomycin-bacitracin-polymyxin  (NEOSPORIN) ointment   Topical BID Nwoko, Agnes I, NP      . pantoprazole (PROTONIX) EC tablet 40 mg  40 mg Oral Daily Rankin, Shuvon B, NP   40 mg at 08/20/17 0810  . traZODone (DESYREL) tablet 100 mg  100 mg Oral QHS PRN Rankin, Shuvon B, NP   100 mg at 08/19/17 2122  . venlafaxine XR (EFFEXOR-XR) 24 hr capsule 37.5 mg  37.5 mg Oral Q breakfast Rankin, Shuvon B, NP   37.5 mg at 08/20/17 0810  . verapamil (CALAN-SR) CR tablet 240 mg  240 mg Oral QHS Rankin, Shuvon B, NP   240 mg at 08/19/17 2122   PTA Medications: Medications Prior to  Admission  Medication Sig Dispense Refill Last Dose  . albuterol (PROVENTIL HFA;VENTOLIN HFA) 108 (90 Base) MCG/ACT inhaler Inhale 2 puffs into the lungs every 6 (six) hours as needed for wheezing or shortness of breath.    Past Month at Unknown time  . ARIPiprazole (ABILIFY) 5 MG tablet Take 1 tablet (5 mg total) by mouth 2 (two) times daily. (Patient taking differently: Take 10 mg by mouth 2 (two) times daily. ) 60 tablet 0 08/19/2017 at Unknown time  . fluticasone (FLONASE) 50 MCG/ACT nasal spray Place 1 spray into both nostrils daily.   08/19/2017 at Unknown time  . hydrochlorothiazide (MICROZIDE) 12.5 MG capsule Take 12.5 mg by mouth daily.   08/19/2017 at Unknown time  . losartan (COZAAR) 25 MG tablet Take 25 mg by mouth daily.   08/19/2017 at Unknown time  . pantoprazole (PROTONIX) 40 MG tablet Take 1 tablet (40 mg total) by mouth daily. 30 tablet 0 08/19/2017 at Unknown time  . traZODone (DESYREL) 100 MG tablet Take 1 tablet (100 mg total) by mouth at bedtime as needed for sleep. 30 tablet 0 08/17/2017  . venlafaxine XR (EFFEXOR-XR) 37.5 MG 24 hr capsule Take 1 capsule (37.5 mg total) by mouth daily with breakfast. (Patient taking differently: Take 150 mg by mouth daily with breakfast. ) 30 capsule 0 08/19/2017 at Unknown time  . verapamil (CALAN-SR) 240 MG CR tablet Take 240 mg by mouth at bedtime.   08/18/2017 at Unknown time    Musculoskeletal: Strength  & Muscle Tone: within normal limits Gait & Station: normal Patient leans: N/A  Psychiatric Specialty Exam: Physical Exam  Constitutional: She appears well-developed and well-nourished.  HENT:  Head: Normocephalic and atraumatic.  Respiratory: Effort normal.  Neurological: She is alert.  Psychiatric:  As above    ROS  Blood pressure 120/77, pulse (!) 108, temperature 97.8 F (36.6 C), temperature source Oral, resp. rate 16, height 5\' 5"  (1.651 m), weight 67.6 kg (149 lb), SpO2 100 %.Body mass index is 24.79 kg/m.  General Appearance: Well Groomed  Eye Contact:  Good  Speech:  Hoarse  Volume:  At baseline  Mood:  Depressed  Affect:  Blunted and mood congruent  Thought Process:  Linear  Orientation:  Full (Time, Place, and Person)  Thought Content:  Focused on being mistreated at her home. Does not want to go back there.   Suicidal Thoughts:  No active thoughts once she is away from her home  Homicidal Thoughts:  No  Memory:  Unable to assess at this time.   Judgement:  Poor  Insight:  Shallow  Psychomotor Activity:  Normal  Concentration:  Concentration: Fair and Attention Span: Fair  Recall:  Unable to assess at this time.   Fund of Knowledge:  Poor  Language:  Fair  Akathisia:  Negative  Handed:    AIMS (if indicated):     Assets:  Health and safety inspectorinancial Resources/Insurance Housing Physical Health Resilience  ADL's:  Intact  Cognition:  WNL  Sleep:  Number of Hours: 6.75    Treatment Plan Summary: Patient has ASD and mood disorder. Recent behavior was precipitated by relational difficulties at her group home. She is rigidly fixated on going to a new home. We plan to continue her home medications and evaluate her further.  Psychiatric: ASD BLPD  Medical: HTN GERD  Psychosocial:  Poor interpersonal relationship Loss of independence  PLAN: 1. Continue Abilify at home dose 2. Continue Venlafaxine at home dose 3. Encourage unit groups and activities 4. Monitor  mood,  behavior and interaction with peers 5. SW would gather collateral and facilitate aftercare   Observation Level/Precautions:  15 minute checks  Laboratory:    Psychotherapy:    Medications:    Consultations:    Discharge Concerns:    Estimated LOS:  Other:     Physician Treatment Plan for Primary Diagnosis: <principal problem not specified> Long Term Goal(s): Improvement in symptoms so as ready for discharge  Short Term Goals: Ability to identify changes in lifestyle to reduce recurrence of condition will improve, Ability to verbalize feelings will improve, Ability to disclose and discuss suicidal ideas, Ability to demonstrate self-control will improve, Ability to identify and develop effective coping behaviors will improve, Ability to maintain clinical measurements within normal limits will improve and Compliance with prescribed medications will improve  Physician Treatment Plan for Secondary Diagnosis: Active Problems:   MDD (major depressive disorder)  Long Term Goal(s): Improvement in symptoms so as ready for discharge  Short Term Goals: Ability to identify changes in lifestyle to reduce recurrence of condition will improve, Ability to verbalize feelings will improve, Ability to disclose and discuss suicidal ideas, Ability to demonstrate self-control will improve, Ability to identify and develop effective coping behaviors will improve, Ability to maintain clinical measurements within normal limits will improve, Compliance with prescribed medications will improve and Ability to identify triggers associated with substance abuse/mental health issues will improve  I certify that inpatient services furnished can reasonably be expected to improve the patient's condition.    Georgiann Cocker, MD 12/19/20185:18 PM

## 2017-08-20 NOTE — Progress Notes (Addendum)
Adult Psychoeducational Group Note  Date:  08/20/2017 Time:  10:19 PM  Group Topic/Focus:  Wrap-Up Group:   The focus of this group is to help patients review their daily goal of treatment and discuss progress on daily workbooks.  Participation Level:  Active  Participation Quality:  Appropriate  Affect:  Appropriate  Cognitive:  Appropriate  Insight: Appropriate  Engagement in Group:  Engaged  Modes of Intervention:  Discussion  Additional Comments:  Patient attended group and said that her day was a 1. Patient said that her goal for today was to stay positive and she did half of the time.  Patient has the same goal for tomorrow.   Neita Landrigan W Allyn Bartelson 08/20/2017, 10:19 PM

## 2017-08-20 NOTE — [facility] Suicide Risk Assessment (Signed)
[facility]   Nursing information obtained from:  Patient Demographic factors:  Caucasian, Low socioeconomic status, Unemployed Current Mental Status:  Suicidal ideation indicated by patient, Self-harm behaviors, Self-harm thoughts Loss Factors:  Decrease in vocational status Historical Factors:  Prior suicide attempts, Impulsivity Risk Reduction Factors:  Positive therapeutic relationship  Total Time spent with patient: 30 minutes Principal Problem: <principal problem not specified> Diagnosis:   Patient Active Problem List   Diagnosis Date Noted  . MDD (major depressive disorder) [F32.9] 08/19/2017  . Autism spectrum disorder [F84.0] 05/17/2017  . MDD (major depressive disorder), recurrent severe, without psychosis (HCC) [F33.2] 05/16/2017   Subjective Data:  [age] y.oCaucasian female, single, in residential care. History of Autistic Spectrum Disorder. Presented to the hospital via emergency services. Patient was agitated at her group home. She was reported to have been threatening staff and peers at her group home. She scratched both hands with her nails as she was very upset. She expressed suicidal thoughts at her group home. No associated hallucination in any modality. No delusional preoccupation. No passivity of will/thought. Not pervasively depressed, not manic. No changes in cognition. Says she has been slightly calmer in here. Would tell staff if thoughts become overwhelming.  Patient has agreed to recommence her home medications.  Continued Clinical Symptoms:  Alcohol Use Disorder Identification Test Final Score (AUDIT): 0 The "Alcohol Use Disorders Identification Test", Guidelines for Use in Primary Care, Second Edition.  World Science writerHealth Organization [facility]WHO). Score between 0-7:  no or low risk or alcohol related problems. Score between 8-15:  moderate risk of alcohol related problems. Score between 16-19:  high risk of alcohol related problems. Score 20 or  above:  warrants further diagnostic evaluation for alcohol dependence and treatment.   CLINICAL FACTORS:   Depression:   Impulsivity   Musculoskeletal: Strength & Muscle Tone: within normal limits Gait & Station: normal Patient leans: N/A  Psychiatric Specialty Exam: Physical Exam As in H&P  ROS As in H&P  Blood pressure 120/77, pulse (!) 108, temperature 97.8 F (36.6 C), temperature source Oral, resp. rate 16, height 5\' 5"  (1.651 m), weight 67.6 kg (149 lb), SpO2 100 %.Body mass index is 24.79 kg/m.  General Appearance: As in H&P   Eye Contact:  Good  Speech:  Hoarse but clear. Not pressured.   Volume:  Increased  Mood:  Depressed  Affect:  Appropriate and Restricted  Thought Process:  Linear  Orientation:  Full (Time, Place, and Person)  Thought Content:  Rumination  Suicidal Thoughts:  Less thoughts.   Homicidal Thoughts:  No  Memory:  Good  Judgement:  Fair  Insight:  Good  Psychomotor Activity:  Normal  Concentration:  Concentration: Fair and Attention Span: Fair  Recall:  Good  Fund of Knowledge:  Fair  Language:  Good  Akathisia:  Negative  Handed:    AIMS (if indicated):     Assets:  Desire for Improvement Resilience  ADL's:  Intact  Cognition:  WNL  Sleep:  Number of Hours: 6.75      COGNITIVE FEATURES THAT CONTRIBUTE TO RISK:  Closed-mindedness    SUICIDE RISK:   Moderate:  Frequent suicidal ideation with limited intensity, and duration, some specificity in terms of plans, no associated intent, good self-control, limited dysphoria/symptomatology, some risk factors present, and identifiable protective factors, including available and accessible social support.  PLAN OF CARE:  1. Suicide precautions 2. Recommence home medications 3. Collateral from his family   I certify that inpatient services  furnished can reasonably be expected to improve the patient's condition.   Georgiann CockerVincent A Izediuno, MD 08/20/2017, 6:31 PM

## 2017-08-20 NOTE — Progress Notes (Signed)
Recreation Therapy Notes  Date: 08/20/17 Time: 0930 Location: [address] Dayroom  Group Topic: Stress Management  Goal Area(s) Addresses:  Patient will verbalize importance of using healthy stress management.  Patient will identify positive emotions associated with healthy stress management.   Intervention: Stress Management  Activity : Guided Imagery.  LRT introduced the stress management technique of guided imagery.  Patients were to listen and follow along as LRT read script to fully engage in the technique.  Education:  Stress Management, Discharge Planning.   Education Outcome: Acknowledges edcuation/In group clarification offered/Needs additional education  Clinical Observations/Feedback: Pt did not attend group.    Brittany Palmer, Brittany Palmer         Brittany RancherLindsay, Cece Milhouse A 08/20/2017 12:38 PM

## 2017-08-20 NOTE — Progress Notes (Signed)
D: Patient states she is having burning sensation when she urinates.  Informed her I would notify provider of same.  She is pleasant and well-mannered.  She continues to have thoughts of self harm without a specific plan.  She also states that if a member of the group home showed up she would "kill them."  She agreed to contract for safety and if the feelings became overwhelming, she would let staff know.  She is interacting well with her peers.  She presents with flat, blunted affect and depressed mood.  Patient rates her depression, hopelessness and anxiety as a 10.  Patient's appetite is poor and she states, "I just don't feel like eating."  She is sleeping well; her energy level is low and her concentration is good.  A: Continue to monitor medication management and MD orders.  Safety checks completed every 15 minutes per protocol. Offer support and encouragement as needed.  R: Patient is receptive to staff; her behavior is appropriate.

## 2017-08-21 DIAGNOSIS — F39 Unspecified mood [affective] disorder: Secondary | ICD-10-CM

## 2017-08-21 DIAGNOSIS — G4700 Insomnia, unspecified: Secondary | ICD-10-CM

## 2017-08-21 MED ORDER — ARIPIPRAZOLE 10 MG PO TABS
20.0000 mg | ORAL_TABLET | Freq: Every day | ORAL | Status: DC
  Administered 2017-08-22 – 2017-08-28 (×7): 20 mg via ORAL
  Filled 2017-08-21 (×10): qty 2

## 2017-08-21 MED ORDER — VENLAFAXINE HCL ER 75 MG PO CP24
75.0000 mg | ORAL_CAPSULE | Freq: Every day | ORAL | Status: DC
  Administered 2017-08-22 – 2017-08-28 (×7): 75 mg via ORAL
  Filled 2017-08-21 (×10): qty 1

## 2017-08-21 NOTE — Progress Notes (Signed)
Patient denies AVH and HI, but endorses SI with no plan.  Patient states "I am able to stay safe in the hospital.  Patient was noted to be free of self injury this shift."  Assess patient for safety, offer medications as prescribed, engage patient in 1:1 staff talks.   Patient able to contract for safety in the hospital, continue to monitor as planned.

## 2017-08-21 NOTE — [facility] Group Notes (Signed)
[facility] 08/21/2017 1:15pm  Type of Therapy: Mental Health Association Presentation  Participation Level: Active  Participation Quality: Attentive  Affect: Appropriate  Cognitive: Oriented  Insight: Developing/Improving  Engagement in Therapy: Engaged  Modes of Intervention: Discussion, Education and Socialization  Summary of Progress/Problems: Mental Health Association (MHA) Speaker came to talk about his personal journey with mental health. The pt processed ways by which to relate to the speaker. MHA speaker provided handouts and educational information pertaining to groups and services offered by the [facility]. Pt was engaged in speaker's presentation and was receptive to resources provided.    Lorri FrederickWierda, Malai Lady Jon, LCSW 08/21/2017 2:57 PM

## 2017-08-21 NOTE — Progress Notes (Signed)
Adult Psychoeducational Group Note  Date:  08/21/2017 Time:  11:30 PM  Group Topic/Focus:  Wrap-Up Group:   The focus of this group is to help patients review their daily goal of treatment and discuss progress on daily workbooks.  Participation Level:  Active  Participation Quality:  Appropriate  Affect:  Appropriate  Cognitive:  Appropriate  Insight: Appropriate  Engagement in Group:  Engaged  Modes of Intervention:  Discussion  Additional Comments:  Patient attended group and said that her day was a 8.  Patient said she was excited because she had starting eating regularly, although she still vomited a little in between meals.    Jonel Sick W Juniper Cobey 08/21/2017, 11:30 PM

## 2017-08-21 NOTE — [facility] Counselor (Signed)
Adult Comprehensive Assessment  Patient ID: Brittany Palmer, female   DOB: 14-Jul-1996, 21 y.o.   MRN: [id number]  Information Source:    Current Stressors:  Housing / Lack of housing: Pt lives in a "girls home" and reports she got angry due some things said by other residents. Social relationships: Problems with some of the residents where she lives.  Living/Environment/Situation:  Living Arrangements: Group Home Living conditions (as described by patient or guardian): conflict with other residents How long has patient lived in current situation?: 6 years What is atmosphere in current home: Chaotic  Family History:  Marital status: Single Are you sexually active?: No What is your sexual orientation?: heterosexual Has your sexual activity been affected by drugs, alcohol, medication, or emotional stress?: na Does patient have children?: No  Childhood History:  By whom was/is the patient raised?: Both parents, Adoptive parents Additional childhood history information: Patient states that she was with her biological parents until the age of 89. Patient has been with biological parents since 89 y/o Description of patient's relationship with caregiver when they were a child: pretty good Patient's description of current relationship with people who raised him/her: good relationship with both parents How were you disciplined when you got in trouble as a child/adolescent?: spanking, removing priveleges Does patient have siblings?: Yes Number of Siblings: 7 Description of patient's current relationship with siblings: sister comiited suicide in March 2018, has 6 brothers. She has a good relationship with her brothers Did patient suffer any verbal/emotional/physical/sexual abuse as a child?: No Did patient suffer from severe childhood neglect?: Yes Patient description of severe childhood neglect: pt removed from bio parents due to drug use Has patient ever been sexually abused/assaulted/raped as an  adolescent or adult?: No Was the patient ever a victim of a crime or a disaster?: No Witnessed domestic violence?: No Has patient been effected by domestic violence as an adult?: No  Education:  Highest grade of school patient has completed: HS diploma Currently a Consulting civil engineerstudent?: No Learning disability?: No  Employment/Work Situation:   Employment situation: On disability Why is patient on disability: pt is unsure How long has patient been on disability: 3 years Patient's job has been impacted by current illness: (na) What is the longest time patient has a held a job?: Pt has not had employment Has patient ever been in the Eli Lilly and Companymilitary?: No Are There Guns or Other Weapons in Your Home?: No  Financial Resources:   Surveyor, quantityinancial resources: Receives SSI Does patient have a Lawyerrepresentative payee or guardian?: Yes Name of representative payee or guardian: mother  Alcohol/Substance Abuse:   What has been your use of drugs/alcohol within the last 12 months?: Pt denies alcohol or drugs If attempted suicide, did drugs/alcohol play a role in this?: No Alcohol/Substance Abuse Treatment Hx: Denies past history Has alcohol/substance abuse ever caused legal problems?: No  Social Support System:   Conservation officer, natureatient's Community Support System: Fair Museum/gallery exhibitions officerDescribe Community Support System: parents Type of faith/religion: baptist How does patient's faith help to cope with current illness?: "I don't really care for it."  Leisure/Recreation:   Leisure and Hobbies: Swinging on swings; listening to music  Strengths/Needs:   What things does the patient do well?: Pt could not identify anything. In what areas does patient struggle / problems for patient: Current living situation  Discharge Plan:   Does patient have access to transportation?: Yes Will patient be returning to same living situation after discharge?: Yes(pt would like to live somewhere else) Currently receiving community mental health services:  Yes ([facility] Does patient have financial barriers related to discharge medications?: No  Summary/Recommendations:   Summary and Recommendations (to be completed by the evaluator): [place] 21 year [place] [facility]([place]  Pt is diagnosed with major depressive disorder and autism and was admitted due to a conflict at her current group home.  Pt reports she would like to live somewhere else.  Recommendations for pt include crisis stabilization, therapeutic milieu, attend and participate in groups, medication management, and development of comprhensive mentall wellness plan.  Lorri FrederickWierda, Cicero Noy Jon. 08/21/2017

## 2017-08-21 NOTE — Progress Notes (Signed)
Pt has been back and forth to the nurse's station all evening.  She told staff at the beginning of the shift that she was feeling very anxious and had been observed pacing the hall.  Writer introduced self to pt, and when her MAR was checked, it was discovered that the pt did not have a prn for anxiety.  The evening provider was notified and an order for Vistaril 25 mg TID PRN was obtained as this dosing was exactly what she received at her last admission.  Pt was given a dose after pharmacy verification.  A short time later, pt was again at the NS stating that she was having UTI symptoms and wanted to know if the previous nurse had told Clinical research associatewriter.  It had been discussed, but not UA or culture was ordered.  [facility]  Provider ordered a urine culture, and a urine specimen was given by the pt and placed in the unit lab fridge.  Pt was assured that the test would be done tomorrow.  Pt reports ongoing passive SI and HI towards some at the group home where she resides.  She denies AVH.  She makes her needs known to staff.  She has been in and out of the dayroom.  She went to evening group and participated.  Her speech is loud and her thought processes seem to be delayed.  She has been pleasant, polite and cooperative.  Support and encouragement offered.  Discharge plans are in process.  Safety maintained with q15 minute checks.

## 2017-08-21 NOTE — Progress Notes (Signed)
[facility]  08/21/2017 1:44 PM Brittany Palmer  MRN:  [id number]   Subjective:  Patient reports that she is still severely depressed and hasn't wanted to get up out of the bed much. She reports no appetite and hasn't eaten in 2 days. She reports that her depression is because of the home she lives in and that she "acted out" when they told her nothing was wrong with her and she was seeking attention.   Objective: Patient's chart and findings reviewed and discussed with treatment team. Patient is encouraged to eat and to drink more liquids. Patient came to the day room soon after the interview. Patient will continue the current medications. [facility] [facility] [place] and patient takes Abilify 10 mg BID and there were 2 orders for Effexor-XR one for 37.5 mg and one for 150 mg. Will increase Abilify to 20 mg Daily and Effexor-XR to 75 mg Daily. Staff reported that after interview patient ate lunch today.  Principal Problem: MDD (major depressive disorder), recurrent severe, without psychosis (HCC) Diagnosis:   Patient Active Problem List   Diagnosis Date Noted  . Autism spectrum disorder [F84.0] 05/17/2017  . MDD (major depressive disorder), recurrent severe, without psychosis (HCC) [F33.2] 05/16/2017   Total Time spent with patient: 25 minutes  Past Psychiatric History: See H&P  Past Medical History:  Past Medical History:  Diagnosis Date  . Autism   . GERD (gastroesophageal reflux disease)   . Hypertension     Past Surgical History:  Procedure Laterality Date  . TONSILLECTOMY     Family History: History reviewed. No pertinent family history. Family Psychiatric  History: See H&P Social History:  Social History   Substance and Sexual Activity  Alcohol Use No     Social History   Substance and Sexual Activity  Drug Use No    Social History   Socioeconomic History  . Marital status: Single    Spouse name: None  . Number of children:  None  . Years of education: None  . Highest education level: None  Social Needs  . Financial resource strain: None  . Food insecurity - worry: None  . Food insecurity - inability: None  . Transportation needs - medical: None  . Transportation needs - non-medical: None  Occupational History  . None  Tobacco Use  . Smoking status: Never Smoker  . Smokeless tobacco: Never Used  Substance and Sexual Activity  . Alcohol use: No  . Drug use: No  . Sexual activity: No  Other Topics Concern  . None  Social History Narrative  . None   Additional Social History:                         Sleep: Good  Appetite:  Poor  Current Medications: Current Facility-Administered Medications  Medication Dose Route Frequency Provider Last Rate Last Dose  . acetaminophen (TYLENOL) tablet 650 mg  650 mg Oral Q6H PRN Rankin, Shuvon B, NP   650 mg at 08/21/17 0743  . albuterol (PROVENTIL HFA;VENTOLIN HFA) 108 (90 Base) MCG/ACT inhaler 2 puff  2 puff Inhalation Q6H PRN Donell SievertSimon, Spencer E, PA-C      . alum & mag hydroxide-simeth (MAALOX/MYLANTA) 200-200-20 MG/5ML suspension 30 mL  30 mL Oral Q4H PRN Rankin, Shuvon B, NP      . ARIPiprazole (ABILIFY) tablet 15 mg  15 mg Oral Daily Izediuno, Delight OvensVincent A, MD   15 mg at 08/21/17 0741  .  hydrochlorothiazide (MICROZIDE) capsule 12.5 mg  12.5 mg Oral Daily Rankin, Shuvon B, NP   12.5 mg at 08/21/17 0742  . hydrOXYzine (ATARAX/VISTARIL) tablet 25 mg  25 mg Oral TID PRN Kerry HoughSimon, Spencer E, PA-C   25 mg at 08/20/17 1955  . magnesium hydroxide (MILK OF MAGNESIA) suspension 30 mL  30 mL Oral Daily PRN Rankin, Shuvon B, NP      . neomycin-bacitracin-polymyxin (NEOSPORIN) ointment   Topical BID Nwoko, Agnes I, NP      . pantoprazole (PROTONIX) EC tablet 40 mg  40 mg Oral Daily Rankin, Shuvon B, NP   40 mg at 08/21/17 0742  . traZODone (DESYREL) tablet 100 mg  100 mg Oral QHS PRN Rankin, Shuvon B, NP   100 mg at 08/20/17 2155  . venlafaxine XR (EFFEXOR-XR) 24 hr  capsule 37.5 mg  37.5 mg Oral Q breakfast Rankin, Shuvon B, NP   37.5 mg at 08/21/17 0741  . verapamil (CALAN-SR) CR tablet 240 mg  240 mg Oral QHS Rankin, Shuvon B, NP   240 mg at 08/20/17 2154    Lab Results: No results found for this or any previous visit (from the past 48 hour(s)).  Blood Alcohol level:  No results found for: [facility]  Metabolic Disorder Labs: Lab Results  Component Value Date   HGBA1C 5.8 (H) 04/12/2011   MPG 120 (H) 04/12/2011   Lab Results  Component Value Date   PROLACTIN 30.2 04/12/2011   Lab Results  Component Value Date   CHOL 115 04/12/2011   TRIG 58 04/12/2011   HDL 47 04/12/2011   CHOLHDL 2.4 04/12/2011   VLDL 12 04/12/2011   LDLCALC 56 04/12/2011    Physical Findings: AIMS: Facial and Oral Movements Muscles of Facial Expression: None, normal Lips and Perioral Area: None, normal Jaw: None, normal Tongue: None, normal,Extremity Movements Upper (arms, wrists, hands, fingers): None, normal Lower (legs, knees, ankles, toes): None, normal, Trunk Movements Neck, shoulders, hips: None, normal, Overall Severity Severity of abnormal movements (highest score from questions above): None, normal Incapacitation due to abnormal movements: None, normal Patient's awareness of abnormal movements (rate only patient's report): No Awareness, Dental Status Current problems with teeth and/or dentures?: No Does patient usually wear dentures?: No  CIWA:    COWS:     Musculoskeletal: Strength & Muscle Tone: within normal limits Gait & Station: normal Patient leans: N/A  Psychiatric Specialty Exam: Physical Exam  Nursing note and vitals reviewed. Constitutional: She is oriented to person, place, and time. She appears well-developed and well-nourished.  Respiratory: Effort normal.  Musculoskeletal: Normal range of motion.  Neurological: She is alert and oriented to person, place, and time.  Skin: Skin is warm.    Review of Systems  Constitutional: Negative.    HENT: Negative.   Eyes: Negative.   Respiratory: Negative.   Cardiovascular: Negative.   Gastrointestinal: Negative.   Genitourinary: Negative.   Musculoskeletal: Negative.   Skin: Negative.   Neurological: Negative.   Endo/Heme/Allergies: Negative.   Psychiatric/Behavioral: Positive for depression. Negative for hallucinations and suicidal ideas.    Blood pressure 116/74, pulse (!) 112, temperature 97.8 F (36.6 C), resp. rate 18, height 5\' 5"  (1.651 m), weight 67.6 kg (149 lb), SpO2 100 %.Body mass index is 24.79 kg/m.  General Appearance: Casual  Eye Contact:  Good  Speech:  Clear and Coherent and Normal Rate  Volume:  Increased  Mood:  Depressed  Affect:  Depressed and Flat  Thought Process:  Goal Directed and Descriptions of  Associations: Intact  Orientation:  Full (Time, Place, and Person)  Thought Content:  WDL  Suicidal Thoughts:  No  Homicidal Thoughts:  No  Memory:  Immediate;   Good Recent;   Good Remote;   Good  Judgement:  Fair  Insight:  Fair  Psychomotor Activity:  Normal  Concentration:  Concentration: Good and Attention Span: Good  Recall:  Good  Fund of Knowledge:  Good  Language:  Good  Akathisia:  No  Handed:  Right  AIMS (if indicated):     Assets:  Communication Skills Desire for Improvement Financial Resources/Insurance Housing Social Support Transportation  ADL's:  Intact  Cognition:  WNL  Sleep:  Number of Hours: 6.5   Problems Addressed: MDD severe Autism Spectrum Disorder  Treatment Plan Summary: Daily contact with patient to assess and evaluate symptoms and progress in treatment, Medication management and Plan is to:  -Increase Abilify 20 mg PO Daily for mood control -Increase Effexor-XR 75 mg PO Daily for mood control -Continue Trazodone 100 mg PO QHS PRN for insomnia -Encourage eating and drinking -Encourage group therapy participation   Brittany Bunnell, FNP 08/21/2017, 1:44 PM

## 2017-08-21 NOTE — [facility] Suicide Risk Assessment (Signed)
[facility] INPATIENT:  Family/Significant Other Suicide Prevention Education  Suicide Prevention Education:  Education Completed;  Althea CharonDana Loftin, mother/legal guardian, [phone],has been identified by the patient as the family member/significant other with whom the patient will be residing, and identified as the person(s) who will aid the patient in the event of a mental health crisis (suicidal ideations/suicide attempt).  With written consent from the patient, the family member/significant other has been provided the following suicide prevention education, prior to the and/or following the discharge of the patient.  The suicide prevention education provided includes the following:  Suicide risk factors  Suicide prevention and interventions  National Suicide Hotline telephone number  [facility] assessment telephone number  [facility] Emergency Assistance 911  [facility] and/or Residential Mobile Crisis Unit telephone number  Request made of family/significant other to:  Remove weapons (e.g., guns, rifles, knives), all items previously/currently identified as safety concern.  No guns that the home where pt lives.  Remove drugs/medications (over-the-counter, prescriptions, illicit drugs), all items previously/currently identified as a safety concern. All medications locked at place where pt lives.  The family member/significant other verbalizes understanding of the suicide prevention education information provided.  The family member/significant other agrees to remove the items of safety concern listed above.  CSW spoke to mother/legal guardian.  Pt has not been communicating with her recently and she did [facility].  Releases can be faxed to: [email or web address].  She will sign and scan them back.  Pt is on disability due to autism.  Pt has been talking about "going to a group home" for some time.  Pt thinks she is going to be able to live in an apartment  independently, which is not an option.  Mother is not going to move her as this is a good program.  [facility] admission and mother is worried that the program may discharge her.  Mother has never heard any allegations of pt being mistreated.  Pt is actually one of the older residents there with more younger kids.  [facility]  Lorri FrederickWierda, Yulanda Diggs Jon, LCSW 08/21/2017, 1:46 PM

## 2017-08-22 LAB — URINE CULTURE

## 2017-08-22 MED ORDER — GABAPENTIN 100 MG PO CAPS
100.0000 mg | ORAL_CAPSULE | Freq: Three times a day (TID) | ORAL | Status: DC | PRN
  Administered 2017-08-22 – 2017-08-23 (×2): 100 mg via ORAL
  Filled 2017-08-22 (×2): qty 1

## 2017-08-22 NOTE — Progress Notes (Signed)
D: Pt denies SI/HI/AVH. Pt is pleasant and cooperative. Pt stated she was concerned due to anxiety and agitation. Pt explained the Neurontin was to help her with that feeling. Pt was concerned that the Effexor was making her "horny". Pt explained that was not a norrnal response for that medication, but she needs to speak with the doctor about that feeling  A: Pt was offered support and encouragement. Pt was given scheduled medications. Pt was encourage to attend groups. Q 15 minute checks were done for safety.   R:Pt attends groups and interacts well with peers and staff. Pt is taking medication. Pt receptive to treatment and safety maintained on unit.

## 2017-08-22 NOTE — Progress Notes (Signed)
[facility] MD Progress Note  08/22/2017 1:03 PM Brittany Palmer  MRN:  [id number]   Subjective:  Patient reports she is feeling better today. She denies any SI/HI/AVH and contracts for safety. Patient reports that she gets agitated and lashes out and would like something better than Vistaril for agitation because it doesn't really help.   Objective: Patient's chart and findings reviewed and discussed with treatment team. Patient presents in a pleasant mood today. She has been eating every meal since lunch yesterday. She has been in the day room interacting appropriately. She is informed that she will have to discuss another place to live with her legal guardian which is also her mother. She will be started on Gabapentin 100 mg TID PRN for agitation. Will also continue increasing Effexor-XR to 150 mg on Sunday.   Principal Problem: MDD (major depressive disorder), recurrent severe, without psychosis (HCC) Diagnosis:   Patient Active Problem List   Diagnosis Date Noted  . Autism spectrum disorder [F84.0] 05/17/2017  . MDD (major depressive disorder), recurrent severe, without psychosis (HCC) [F33.2] 05/16/2017   Total Time spent with patient: 25 minutes  Past Psychiatric History: See H&P  Past Medical History:  Past Medical History:  Diagnosis Date  . Autism   . GERD (gastroesophageal reflux disease)   . Hypertension     Past Surgical History:  Procedure Laterality Date  . TONSILLECTOMY     Family History: History reviewed. No pertinent family history. Family Psychiatric  History: See H&P Social History:  Social History   Substance and Sexual Activity  Alcohol Use No     Social History   Substance and Sexual Activity  Drug Use No    Social History   Socioeconomic History  . Marital status: Single    Spouse name: None  . Number of children: None  . Years of education: None  . Highest education level: None  Social Needs  . Financial resource strain: None  . Food insecurity -  worry: None  . Food insecurity - inability: None  . Transportation needs - medical: None  . Transportation needs - non-medical: None  Occupational History  . None  Tobacco Use  . Smoking status: Never Smoker  . Smokeless tobacco: Never Used  Substance and Sexual Activity  . Alcohol use: No  . Drug use: No  . Sexual activity: No  Other Topics Concern  . None  Social History Narrative  . None   Additional Social History:                         Sleep: Good  Appetite:  Good  Current Medications: Current Facility-Administered Medications  Medication Dose Route Frequency Provider Last Rate Last Dose  . acetaminophen (TYLENOL) tablet 650 mg  650 mg Oral Q6H PRN Rankin, Shuvon B, NP   650 mg at 08/21/17 0743  . albuterol (PROVENTIL HFA;VENTOLIN HFA) 108 (90 Base) MCG/ACT inhaler 2 puff  2 puff Inhalation Q6H PRN Donell Sievert E, PA-C   2 puff at 08/22/17 1108  . alum & mag hydroxide-simeth (MAALOX/MYLANTA) 200-200-20 MG/5ML suspension 30 mL  30 mL Oral Q4H PRN Rankin, Shuvon B, NP      . ARIPiprazole (ABILIFY) tablet 20 mg  20 mg Oral Daily Tessie Ordaz B, FNP   20 mg at 08/22/17 0825  . hydrochlorothiazide (MICROZIDE) capsule 12.5 mg  12.5 mg Oral Daily Rankin, Shuvon B, NP   12.5 mg at 08/22/17 0825  . hydrOXYzine (ATARAX/VISTARIL) tablet 25  mg  25 mg Oral TID PRN Kerry HoughSimon, Spencer E, PA-C   25 mg at 08/21/17 2127  . magnesium hydroxide (MILK OF MAGNESIA) suspension 30 mL  30 mL Oral Daily PRN Rankin, Shuvon B, NP      . neomycin-bacitracin-polymyxin (NEOSPORIN) ointment   Topical BID Armandina StammerNwoko, Agnes I, NP   1 application at 08/22/17 [phone]  . pantoprazole (PROTONIX) EC tablet 40 mg  40 mg Oral Daily Rankin, Shuvon B, NP   40 mg at 08/22/17 0825  . traZODone (DESYREL) tablet 100 mg  100 mg Oral QHS PRN Rankin, Shuvon B, NP   100 mg at 08/21/17 2127  . venlafaxine XR (EFFEXOR-XR) 24 hr capsule 75 mg  75 mg Oral Q breakfast Labrea Eccleston, Gerlene Burdockravis B, FNP   75 mg at 08/22/17 [id number]  .  verapamil (CALAN-SR) CR tablet 240 mg  240 mg Oral QHS Rankin, Shuvon B, NP   240 mg at 08/21/17 2127    Lab Results:  Results for orders placed or performed during the hospital encounter of 08/19/17 (from the past 48 hour(s))  Culture, Urine     Status: Abnormal   Collection Time: 08/20/17  8:55 PM  Result Value Ref Range   Specimen Description      URINE, CLEAN CATCH Performed at [facility], [address] [place], [place] [address]    Special Requests      NONE Performed at [facility], [address] [place], [place] [address]    Culture (A)     <10,000 COLONIES/mL INSIGNIFICANT GROWTH Performed at [facility], [address] [place], [place] [address]    Report Status 08/22/2017 FINAL     Blood Alcohol level:  No results found for: [facility]  Metabolic Disorder Labs: Lab Results  Component Value Date   HGBA1C 5.8 (H) 04/12/2011   MPG 120 (H) 04/12/2011   Lab Results  Component Value Date   PROLACTIN 30.2 04/12/2011   Lab Results  Component Value Date   CHOL 115 04/12/2011   TRIG 58 04/12/2011   HDL 47 04/12/2011   CHOLHDL 2.4 04/12/2011   VLDL 12 04/12/2011   LDLCALC 56 04/12/2011    Physical Findings: AIMS: Facial and Oral Movements Muscles of Facial Expression: None, normal Lips and Perioral Area: None, normal Jaw: None, normal Tongue: None, normal,Extremity Movements Upper (arms, wrists, hands, fingers): None, normal Lower (legs, knees, ankles, toes): None, normal, Trunk Movements Neck, shoulders, hips: None, normal, Overall Severity Severity of abnormal movements (highest score from questions above): None, normal Incapacitation due to abnormal movements: None, normal Patient's awareness of abnormal movements (rate only patient's report): No Awareness, Dental Status Current problems with teeth and/or dentures?: No Does patient usually wear dentures?: No  CIWA:    COWS:     Musculoskeletal: Strength &  Muscle Tone: within normal limits Gait & Station: normal Patient leans: N/A  Psychiatric Specialty Exam: Physical Exam  Nursing note and vitals reviewed. Constitutional: She is oriented to person, place, and time. She appears well-developed and well-nourished.  Respiratory: Effort normal.  Musculoskeletal: Normal range of motion.  Neurological: She is alert and oriented to person, place, and time.  Skin: Skin is warm.    Review of Systems  Constitutional: Negative.   HENT: Negative.   Eyes: Negative.   Respiratory: Negative.   Cardiovascular: Negative.   Gastrointestinal: Negative.   Genitourinary: Negative.   Musculoskeletal: Negative.   Skin: Negative.   Neurological: Negative.   Endo/Heme/Allergies: Negative.  Psychiatric/Behavioral: Negative for depression, hallucinations and suicidal ideas.    Blood pressure 112/75, pulse (!) 109, temperature 98.4 F (36.9 C), temperature source Oral, resp. rate 18, height 5\' 5"  (1.651 m), weight 67.6 kg (149 lb), SpO2 100 %.Body mass index is 24.79 kg/m.  General Appearance: Casual  Eye Contact:  Good  Speech:  Clear and Coherent and Normal Rate  Volume:  Increased  Mood:  Euthymic  Affect:  Flat  Thought Process:  Goal Directed and Descriptions of Associations: Intact  Orientation:  Full (Time, Place, and Person)  Thought Content:  WDL  Suicidal Thoughts:  No  Homicidal Thoughts:  No  Memory:  Immediate;   Good Recent;   Good Remote;   Good  Judgement:  Fair  Insight:  Fair  Psychomotor Activity:  Normal  Concentration:  Concentration: Good and Attention Span: Good  Recall:  Good  Fund of Knowledge:  Good  Language:  Good  Akathisia:  No  Handed:  Right  AIMS (if indicated):     Assets:  Communication Skills Desire for Improvement Financial Resources/Insurance Housing Social Support Transportation  ADL's:  Intact  Cognition:  WNL  Sleep:  Number of Hours: 4.5   Problems Addressed: MDD severe Autism Spectrum  Disorder  Treatment Plan Summary: Daily contact with patient to assess and evaluate symptoms and progress in treatment, Medication management and Plan is to:  -Continue Abilify 20 mg PO Daily for mood control -Continue Effexor-XR 75 mg PO Daily for mood control -Continue Trazodone 100 mg PO QHS PRN for insomnia -Start Gabapentin 100 mg PO TID PRN for agitation -Encourage group therapy participation   Maryfrances Bunnellravis B Charlsie Fleeger, FNP 08/22/2017, 1:03 PM

## 2017-08-22 NOTE — Progress Notes (Signed)
Nursing Note: 0700-1900  D:  Pt presents with anxious mood and blunted affect, childlike behavior. Goal for today: "Focus on the positive, don't let others pull me down."  Pt denies feeling depressed or hopeless today, rates anxiety 3/10. "I started getting violent at the home, I don't like it there"  A:  Encouraged to verbalize needs and concerns, active listening and support provided.  Continued Q 15 minute safety checks.  Observed active participation in group settings.  R:  Pt. Has been pleasant and cooperative throughout shift, no anger episodes, no requests for prn meds.  Denies A/V hallucinations and is able to verbally contract for safety.

## 2017-08-22 NOTE — Progress Notes (Addendum)
Recreation Therapy Notes  Date: 08/22/17 Time: 0930 Location: [address] Dayroom  Group Topic: Stress Management  Goal Area(s) Addresses:  Patient will verbalize importance of using healthy stress management.  Patient will identify positive emotions associated with healthy stress management.   Behavioral Response: Engaged  Intervention: Stress Management  Activity :  Progressive Muscle Relaxation.  LRT read a script to lead patients through the stress management technique of progressive muscle relaxation.  Patients were to follow along to engage in the activity as LRT read the script.  Education:  Stress Management, Discharge Planning.   Education Outcome: Acknowledges edcuation/In group clarification offered/Needs additional education  Clinical Observations/Feedback: Pt attended group.    Brittany Palmer, LRT/CTRS          [name] 08/22/2017 11:07 AM 

## 2017-08-22 NOTE — Progress Notes (Signed)
Pt has been out and in the milieu, observed sitting in the dayroom coloring with other patients.  Pt states she is still having some suicidal thoughts, but contracts for safety.  She also continues to harbor ill feelings toward the staff and residents at the group home.  When staff interacts with her, she is polite and cooperative.  She is taking her meds as ordered.  Support and encouragement offered.  Discharge plans are in process.  Safety maintained with q15 minute checks.  0030:  Pt is up and disturbing her roommate by turning the lights on and sitting in the bed coloring while roommate is trying to sleep.  Pt's roommate came to get more sleep meds.  Writer went to pt's room and spoke with her about having the lights on and needing to go to bed.  Pt was encouraged to try to go to sleep as it was important to have rest so she can program during the day.  Pt was agreeable and turned off the lights

## 2017-08-22 NOTE — Progress Notes (Signed)
Adult Psychoeducational Group Note  Date:  08/22/2017 Time:  11:24 PM  Group Topic/Focus:  Wrap-Up Group:   The focus of this group is to help patients review their daily goal of treatment and discuss progress on daily workbooks.  Participation Level:  Active  Participation Quality:  Appropriate  Affect:  Appropriate  Cognitive:  Appropriate  Insight: Appropriate  Engagement in Group:  Engaged  Modes of Intervention:  Discussion  Additional Comments:  Pt stated her goal for today was to interact more with her peers. Pt stated she accomplished her goal today and felt good about it. Pt rated her over all day a 9 out of 10.  Brittany FurnaceChristopher  Brittany Palmer 08/22/2017, 11:24 PM

## 2017-08-22 NOTE — Tx Team (Signed)
Interdisciplinary Treatment and Diagnostic Plan Update  08/22/2017 Time of Session: 1548 Brittany Palmer MRN: [id number]  Principal Diagnosis: MDD (major depressive disorder), recurrent severe, without psychosis (HCC)  Secondary Diagnoses: Principal Problem:   MDD (major depressive disorder), recurrent severe, without psychosis (HCC) Active Problems:   Autism spectrum disorder   Current Medications:  Current Facility-Administered Medications  Medication Dose Route Frequency Provider Last Rate Last Dose  . acetaminophen (TYLENOL) tablet 650 mg  650 mg Oral Q6H PRN Rankin, Shuvon B, NP   650 mg at 08/21/17 0743  . albuterol (PROVENTIL HFA;VENTOLIN HFA) 108 (90 Base) MCG/ACT inhaler 2 puff  2 puff Inhalation Q6H PRN Donell SievertSimon, Spencer E, PA-C   2 puff at 08/22/17 1108  . alum & mag hydroxide-simeth (MAALOX/MYLANTA) 200-200-20 MG/5ML suspension 30 mL  30 mL Oral Q4H PRN Rankin, Shuvon B, NP      . ARIPiprazole (ABILIFY) tablet 20 mg  20 mg Oral Daily Money, Travis B, FNP   20 mg at 08/22/17 0825  . hydrochlorothiazide (MICROZIDE) capsule 12.5 mg  12.5 mg Oral Daily Rankin, Shuvon B, NP   12.5 mg at 08/22/17 0825  . hydrOXYzine (ATARAX/VISTARIL) tablet 25 mg  25 mg Oral TID PRN Kerry HoughSimon, Spencer E, PA-C   25 mg at 08/21/17 2127  . magnesium hydroxide (MILK OF MAGNESIA) suspension 30 mL  30 mL Oral Daily PRN Rankin, Shuvon B, NP      . neomycin-bacitracin-polymyxin (NEOSPORIN) ointment   Topical BID Armandina StammerNwoko, Agnes I, NP   1 application at 08/22/17 [phone]  . pantoprazole (PROTONIX) EC tablet 40 mg  40 mg Oral Daily Rankin, Shuvon B, NP   40 mg at 08/22/17 0825  . traZODone (DESYREL) tablet 100 mg  100 mg Oral QHS PRN Rankin, Shuvon B, NP   100 mg at 08/21/17 2127  . venlafaxine XR (EFFEXOR-XR) 24 hr capsule 75 mg  75 mg Oral Q breakfast Money, Gerlene Burdockravis B, FNP   75 mg at 08/22/17 [phone]  . verapamil (CALAN-SR) CR tablet 240 mg  240 mg Oral QHS Rankin, Shuvon B, NP   240 mg at 08/21/17 2127   PTA  Medications: Medications Prior to Admission  Medication Sig Dispense Refill Last Dose  . albuterol (PROVENTIL HFA;VENTOLIN HFA) 108 (90 Base) MCG/ACT inhaler Inhale 2 puffs into the lungs every 6 (six) hours as needed for wheezing or shortness of breath.    Past Month at Unknown time  . ARIPiprazole (ABILIFY) 5 MG tablet Take 1 tablet (5 mg total) by mouth 2 (two) times daily. (Patient taking differently: Take 10 mg by mouth 2 (two) times daily. ) 60 tablet 0 08/19/2017 at Unknown time  . fluticasone (FLONASE) 50 MCG/ACT nasal spray Place 1 spray into both nostrils daily.   08/19/2017 at Unknown time  . hydrochlorothiazide (MICROZIDE) 12.5 MG capsule Take 12.5 mg by mouth daily.   08/19/2017 at Unknown time  . losartan (COZAAR) 25 MG tablet Take 25 mg by mouth daily.   08/19/2017 at Unknown time  . pantoprazole (PROTONIX) 40 MG tablet Take 1 tablet (40 mg total) by mouth daily. 30 tablet 0 08/19/2017 at Unknown time  . traZODone (DESYREL) 100 MG tablet Take 1 tablet (100 mg total) by mouth at bedtime as needed for sleep. 30 tablet 0 08/17/2017  . venlafaxine XR (EFFEXOR-XR) 37.5 MG 24 hr capsule Take 1 capsule (37.5 mg total) by mouth daily with breakfast. (Patient taking differently: Take 150 mg by mouth daily with breakfast. ) 30 capsule 0 08/19/2017 at Unknown  time  . verapamil (CALAN-SR) 240 MG CR tablet Take 240 mg by mouth at bedtime.   08/18/2017 at Unknown time    Patient Stressors: Other: Conflict with residents and staff at group home  Patient Strengths: Average or above average intelligence Communication skills Physical Health Supportive family/friends  Treatment Modalities: Medication Management, Group therapy, Case management,  1 to 1 session with clinician, Psychoeducation, Recreational therapy.   Physician Treatment Plan for Primary Diagnosis: MDD (major depressive disorder), recurrent severe, without psychosis (HCC) Long Term Goal(s):     Short Term Goals:    Medication  Management: Evaluate patient's response, side effects, and tolerance of medication regimen.  Therapeutic Interventions: 1 to 1 sessions, Unit Group sessions and Medication administration.  Evaluation of Outcomes: Progressing   Physician Treatment Plan for Secondary Diagnosis: Principal Problem:   MDD (major depressive disorder), recurrent severe, without psychosis (HCC) Active Problems:   Autism spectrum disorder  Medication Management: Evaluate patient's response, side effects, and tolerance of medication regimen.  Therapeutic Interventions: 1 to 1 sessions, Unit Group sessions and Medication administration.  Evaluation of Outcomes: Progressing   RN Treatment Plan for Primary Diagnosis: MDD (major depressive disorder), recurrent severe, without psychosis (HCC) Long Term Goal(s): Knowledge of disease and therapeutic regimen to maintain health will improve  Short Term Goals: Ability to identify and develop effective coping behaviors will improve and Compliance with prescribed medications will improve  Medication Management: RN will administer medications as ordered by provider, will assess and evaluate patient's response and provide education to patient for prescribed medication. RN will report any adverse and/or side effects to prescribing provider.  Therapeutic Interventions: 1 on 1 counseling sessions, Psychoeducation, Medication administration, Evaluate responses to treatment, Monitor vital signs and CBGs as ordered, Perform/monitor CIWA, COWS, AIMS and Fall Risk screenings as ordered, Perform wound care treatments as ordered.  Evaluation of Outcomes: Progressing  LCSW Treatment Plan for Primary Diagnosis: MDD (major depressive disorder), recurrent severe, without psychosis (HCC) Long Term Goal(s): Safe transition to appropriate next level of care at discharge, Engage patient in therapeutic group addressing interpersonal concerns.  Short Term Goals: Engage patient in aftercare  planning with referrals and resources, Increase social support and Increase skills for wellness and recovery  Therapeutic Interventions: Assess for all discharge needs, 1 to 1 time with Social worker, Explore available resources and support systems, Assess for adequacy in community support network, Educate family and significant other(s) on suicide prevention, Complete Psychosocial Assessment, Interpersonal group therapy.  Evaluation of Outcomes: Progressing   Progress in Treatment: Attending groups: Yes Participating in groups: YEs Taking medication as prescribed: Yes. Toleration medication: Yes. Family/Significant other contact made: SPE completed with pt's mother/legal guardian.  Patient understands diagnosis: Yes. Discussing patient identified problems/goals with staff: Yes. Medical problems stabilized or resolved: Yes. Denies suicidal/homicidal ideation: Yes. Issues/concerns per patient self-inventory: No. Other: none  New problem(s) identified: No, Describe:  none  New Short Term/Long Term Goal(s): medication management for mood stabilization; elimination of SI thoughts; development of comprehensive mental wellness plan.   Discharge Plan or Barriers: CSW assessing- Pt will return home; follow-up at [facility] [place].   Reason for Continuation of Hospitalization: Depression Medication stabilization  Estimated Length of Stay: Monday, 08/25/17  Attendees: Patient: 08/20/2017   Physician: Dr Tawni Millers, MD; Dr. Jama Flavors MD 08/20/2017   Nursing: Velna Hatchet RN; Meriam Sprague RN; Clydie Braun RN 08/20/2017   RN Care Manager:x 08/20/2017   Social Worker: Trula Slade, LCSW 08/20/2017   Recreational Therapist: x 08/20/2017   Other: Armandina Stammer NP;  Feliz Beamravis Money NP 08/20/2017   Other:  08/20/2017   Other: 08/20/2017    Scribe for Treatment Team: Ledell PeoplesHeather N Smart, LCSW 08/22/2017 1:01 PM

## 2017-08-22 NOTE — Plan of Care (Signed)
  Coping: Ability to cope will improve 08/22/2017 2119 - Progressing by Delos HaringPhillips, Roderick Calo A, RN Note Pt stated she was not as depressed tonight   Medication: Compliance with prescribed medication regimen will improve 08/22/2017 2119 - Progressing by Delos HaringPhillips, Camira Geidel A, RN Note Pt taking medications as prescribed

## 2017-08-23 NOTE — Progress Notes (Signed)
[facility]  08/23/2017 1:03 PM Brittany JulyMacy Palmer  MRN:  [id number] Subjective:   21 y.oCaucasian female, single, in residential care. History of Autistic Spectrum Disorder. Presented to the hospital via emergency services. Patient was agitated at her group home. She was reported to have been threatening staff and peers at her group home. She scratched both hands with her nails as she was very upset. She expressed suicidal thoughts at her group home.  Chart reviewed today. Patient discussed at team today.  Staff reports she has been appropriate. No behavioral issues. Readily redirectable. She has been maintaining normal biological functions. No internal stimulation.   Seen today. Says she is eating well. She is sleeping well. Says she is okay here. Remains fixated on finding a new home. Says she wants to go to a group home. Encouraged  to consider going back to her current home. She has been tehre for six years now. No evidence of psychosis. No evidence of mania. No evidence of depression.   Principal Problem: MDD (major depressive disorder), recurrent severe, without psychosis (HCC) Diagnosis:   Patient Active Problem List   Diagnosis Date Noted  . Autism spectrum disorder [F84.0] 05/17/2017  . MDD (major depressive disorder), recurrent severe, without psychosis (HCC) [F33.2] 05/16/2017   Total Time spent with patient: 20 minutes  Past Psychiatric History: As in H&P  Past Medical History:  Past Medical History:  Diagnosis Date  . Autism   . GERD (gastroesophageal reflux disease)   . Hypertension     Past Surgical History:  Procedure Laterality Date  . TONSILLECTOMY     Family History: History reviewed. No pertinent family history. Family Psychiatric  History: As in H&P Social History:  Social History   Substance and Sexual Activity  Alcohol Use No     Social History   Substance and Sexual Activity  Drug Use No    Social History   Socioeconomic History  .  Marital status: Single    Spouse name: None  . Number of children: None  . Years of education: None  . Highest education level: None  Social Needs  . Financial resource strain: None  . Food insecurity - worry: None  . Food insecurity - inability: None  . Transportation needs - medical: None  . Transportation needs - non-medical: None  Occupational History  . None  Tobacco Use  . Smoking status: Never Smoker  . Smokeless tobacco: Never Used  Substance and Sexual Activity  . Alcohol use: No  . Drug use: No  . Sexual activity: No  Other Topics Concern  . None  Social History Narrative  . None   Additional Social History:          Sleep: Good  Appetite:  Good  Current Medications: Current Facility-Administered Medications  Medication Dose Route Frequency Provider Last Rate Last Dose  . acetaminophen (TYLENOL) tablet 650 mg  650 mg Oral Q6H PRN Rankin, Shuvon B, NP   650 mg at 08/21/17 0743  . albuterol (PROVENTIL HFA;VENTOLIN HFA) 108 (90 Base) MCG/ACT inhaler 2 puff  2 puff Inhalation Q6H PRN Donell SievertSimon, Spencer E, PA-C   2 puff at 08/22/17 1108  . alum & mag hydroxide-simeth (MAALOX/MYLANTA) 200-200-20 MG/5ML suspension 30 mL  30 mL Oral Q4H PRN Rankin, Shuvon B, NP      . ARIPiprazole (ABILIFY) tablet 20 mg  20 mg Oral Daily Money, Gerlene Burdockravis B, FNP   20 mg at 08/23/17 [id number]  . gabapentin (NEURONTIN) capsule 100 mg  100 mg Oral TID PRN Money, Gerlene Burdockravis B, FNP   100 mg at 08/22/17 1811  . hydrochlorothiazide (MICROZIDE) capsule 12.5 mg  12.5 mg Oral Daily Rankin, Shuvon B, NP   12.5 mg at 08/23/17 [id number]  . hydrOXYzine (ATARAX/VISTARIL) tablet 25 mg  25 mg Oral TID PRN Kerry HoughSimon, Spencer E, PA-C   25 mg at 08/22/17 2255  . magnesium hydroxide (MILK OF MAGNESIA) suspension 30 mL  30 mL Oral Daily PRN Rankin, Shuvon B, NP      . neomycin-bacitracin-polymyxin (NEOSPORIN) ointment   Topical BID Nwoko, Agnes I, NP      . pantoprazole (PROTONIX) EC tablet 40 mg  40 mg Oral Daily Rankin, Shuvon B,  NP   40 mg at 08/23/17 0821  . traZODone (DESYREL) tablet 100 mg  100 mg Oral QHS PRN Rankin, Shuvon B, NP   100 mg at 08/22/17 2255  . venlafaxine XR (EFFEXOR-XR) 24 hr capsule 75 mg  75 mg Oral Q breakfast Money, Gerlene Burdockravis B, FNP   75 mg at 08/23/17 [id number]  . verapamil (CALAN-SR) CR tablet 240 mg  240 mg Oral QHS Rankin, Shuvon B, NP   240 mg at 08/22/17 2255    Lab Results: No results found for this or any previous visit (from the past 48 hour(s)).  Blood Alcohol level:  No results found for: [facility]  Metabolic Disorder Labs: Lab Results  Component Value Date   HGBA1C 5.8 (H) 04/12/2011   MPG 120 (H) 04/12/2011   Lab Results  Component Value Date   PROLACTIN 30.2 04/12/2011   Lab Results  Component Value Date   CHOL 115 04/12/2011   TRIG 58 04/12/2011   HDL 47 04/12/2011   CHOLHDL 2.4 04/12/2011   VLDL 12 04/12/2011   LDLCALC 56 04/12/2011    Physical Findings: AIMS: Facial and Oral Movements Muscles of Facial Expression: None, normal Lips and Perioral Area: None, normal Jaw: None, normal Tongue: None, normal,Extremity Movements Upper (arms, wrists, hands, fingers): None, normal Lower (legs, knees, ankles, toes): None, normal, Trunk Movements Neck, shoulders, hips: None, normal, Overall Severity Severity of abnormal movements (highest score from questions above): None, normal Incapacitation due to abnormal movements: None, normal Patient's awareness of abnormal movements (rate only patient's report): No Awareness, Dental Status Current problems with teeth and/or dentures?: No Does patient usually wear dentures?: No  CIWA:    COWS:     Musculoskeletal: Strength & Muscle Tone: within normal limits Gait & Station: normal Patient leans: N/A  Psychiatric Specialty Exam: Physical Exam  Constitutional: She appears well-developed and well-nourished.  HENT:  Head: Normocephalic and atraumatic.  Respiratory: Effort normal.  Neurological: She is alert.  Psychiatric:  As  above    ROS  Blood pressure 119/80, pulse (!) 111, temperature 98.1 F (36.7 C), temperature source Oral, resp. rate 16, height 5\' 5"  (1.651 m), weight 67.6 kg (149 lb), SpO2 100 %.Body mass index is 24.79 kg/m.  General Appearance: Neatly dressed, calm and cooperative.   Eye Contact:  Good  Speech:  Hoarse  Volume:  At baseline  Mood:  Feels okay here  Affect:  Mobilizing some positive affect.   Thought Process:  Linear  Orientation:  Full (Time, Place, and Person)  Thought Content: Fixated on finding a new group home.  No delusional theme. No preoccupation with violent thoughts. No negative ruminations. No obsession.  No hallucination in any modality.   Suicidal Thoughts:  No  Homicidal Thoughts:  No  Memory:  Immediate;   Good  Recent;   Fair Remote;   Fair  Judgement:  Fair  Insight:  Fair  Psychomotor Activity:  Normal  Concentration:  Concentration: Fair and Attention Span: Fair  Recall:  [employer] of Knowledge:  Fair  Language:  Good  Akathisia:  Negative  Handed:    AIMS (if indicated):     Assets:  Financial Resources/Insurance Physical Health Resilience  ADL's:  Intact  Cognition:  WNL  Sleep:  Number of Hours: 5.75     Treatment Plan Summary:  Patient is at her baseline. She is not expressing any suicidal or homicidal thoughts. She is not psychotic or manic. She is not pervasively depressed. She is focused on new placement. We would evaluate her further and explore disposition with her legal guardian.   Psychiatric: ASD BLPD  Medical: HTN GERD  Psychosocial:  Poor interpersonal relationship Loss of independence  PLAN: 1. Continue medications at current dose 2. Continue to monitor mood, behavior and interaction with peers   Georgiann Cocker, MD 08/23/2017, 1:03 PM

## 2017-08-23 NOTE — Plan of Care (Signed)
  Activity: Interest or engagement in activities will improve 08/23/2017 2230 - Progressing by Delos HaringPhillips, Rayaan Garguilo A, RN Note Pt seen interacting with peers    Activity: Sleeping patterns will improve 08/23/2017 2230 - Progressing by Delos HaringPhillips, Latese Dufault A, RN Note Pt slept over 6 hrs last night

## 2017-08-23 NOTE — [facility] Group Notes (Signed)
[facility] Group Notes:  (Nursing/MHT/Case Management/Adjunct)  Date:  08/23/2017  Time:  1330  Type of Therapy:  Nurse Education - Managing Negative Self Talk  Participation Level:  Active  Participation Quality:  Attentive  Affect:  Blunted, Anxious  Cognitive:  Alert  Insight:  Improving  Engagement in Group:  Engaged  Modes of Intervention:  Discussion and Education  Summary of Progress/Problems: Patient educated on ways to "rewrite" negative self talk into positive statements.   Merian CapronFriedman, Nocholas Damaso [facility] 08/23/2017, 1415

## 2017-08-23 NOTE — [facility] Group Notes (Signed)
LCSW Group Therapy Note  Date/Time:  08/23/2017   10:00AM-11:00AM  Type of Therapy and Topic:  Group Therapy:  Fears and Unhealthy/Healthy Coping Skills  Participation Level:  Active   Description of Group:  The focus of this group was to discuss some of the prevalent fears that patients experience, and to identify the commonalities among group members.  A fun exercise was used to initiate the discussion, followed by writing on the white board a group-generated list of unhealthy coping and healthy coping techniques to deal with each fear.    Therapeutic Goals: 1. Patient will be able to distinguish between healthy and unhealthy coping skills 2. Patient will identify and describe 3 fears they experience 3. Patient will identify one positive coping strategy for each fear they experience 4. Patient will respond empathetically to peers' statements regarding fears they experience  Summary of Patient Progress:  The patient expressed herself and participated in the discussion.  She stated she wants to go into a group home, and is not sure where she will go if discharged prior to Christmas.  Therapeutic Modalities Cognitive Behavioral Therapy Motivational Interviewing  Ambrose MantleMareida Grossman-Orr, LCSW

## 2017-08-23 NOTE — Progress Notes (Signed)
D: Pt denies SI/HI/AVH. Pt is pleasant and cooperative. Pt has been appropriate on the unit with no behavior issues, pt has not been somatic this evening. Pt seen interacting with peers this evening.   A: Pt was offered support and encouragement. Pt was given scheduled medications. Pt was encourage to attend groups. Q 15 minute checks were done for safety.   R:Pt attends groups and interacts well with peers and staff. Pt is taking medication. Pt has no complaints at this time .Pt receptive to treatment and safety maintained on unit.

## 2017-08-23 NOTE — Progress Notes (Signed)
[facility] (has [facility].) Discussed with CalioMareida SW. Information printed and given to patient however explained to patient x 2 that this may not be a suitable discharge placement/plan for her. Encouraged to speak with SW when available regarding discharge plan. Explained SW makes discharge plans in agreement with the treatment team. Patient verbalizes understanding.

## 2017-08-23 NOTE — Progress Notes (Signed)
Adult Psychoeducational Group Note  Date:  08/23/2017 Time:  11:04 PM  Group Topic/Focus:  Wrap-Up Group:   The focus of this group is to help patients review their daily goal of treatment and discuss progress on daily workbooks.  Participation Level:  Active  Participation Quality:  Appropriate  Affect:  Appropriate  Cognitive:  Appropriate  Insight: Appropriate  Engagement in Group:  Engaged  Modes of Intervention:  Discussion  Additional Comments:  Pt stated her goal for today stay calm and interact with peers more. Pt stated that she accomplished her goal and felt good about it. Pt stated she attended all groups held. Pt rated her over all day a 10.  Brittany FurnaceChristopher  [name] 08/23/2017, 11:04 PM

## 2017-08-23 NOTE — Plan of Care (Signed)
Patient verbalizes understanding of information, education provided.  Patient has not engaged in self harm, denies thoughts to do so. Verbal contract in place.

## 2017-08-23 NOTE — Progress Notes (Signed)
D: Patient observed up and visible in the milieu. Patient's affect anxious, mood congruent. Per self inventory and discussions with writer, rates depression at a 0/10, hopelessness at a 0/10 and anxiety at a 3/10. Rates sleep as good, appetite as good, energy as normal and concentration as good.  States goal for today is to "be calm, talk to doctor about taking something to calm me down." Denies pain, physical complaints.   A: Medicated per orders, no prns required or requested. Level III obs in place for safety. Emotional support offered and self inventory reviewed. Encouraged completion of Suicide Safety Plan and programming participation. Discussed POC with MD, SW.  R: Patient verbalizes understanding of POC. Patient denies SI/AVH but endorses [facility] and residents. Denies plan, intent. Remains safe on level III obs. Will continue to monitor closely and make verbal contact frequently.

## 2017-08-24 NOTE — [facility] Group Notes (Signed)
[facility]  Date/Time:  08/24/2017 10:00-11:00AM  Type of Therapy and Topic:  Group Therapy:  Healthy and Unhealthy Supports  Participation Level:  Active   Description of Group:  Patients in this group were introduced to the idea of adding a variety of healthy supports to address the various needs in their lives. The picture on the front of Sunday's workbook was used to demonstrate why more supports are needed in every patient's life.  Patients identified and described healthy supports versus unhealthy supports in general, then gave examples of each in their own lives.   They discussed what additional healthy supports could be helpful in their recovery and wellness after discharge in order to prevent future hospitalizations.   An emphasis was placed on using counselor, doctor, therapy groups, 12-step groups, and problem-specific support groups to expand supports.  They also worked as a group on developing a specific plan for several patients to deal with unhealthy supports through boundary-setting, psychoeducation with loved ones, and even termination of relationships.   Therapeutic Goals:   1)  discuss importance of adding supports to stay well once out of the hospital  2)  compare healthy versus unhealthy supports and identify some examples of each  3)  generate ideas and descriptions of healthy supports that can be added  4)  offer mutual support about how to address unhealthy supports  5)  encourage active participation in and adherence to discharge plan    Summary of Patient Progress:  The patient shared that the current healthy supports available in her life are her social worker, the staff in the hospital and her peers, with parents sometimes being healthy for her, while the current unhealthy supports are the peers in her "girls' home" as well as some elements with her parents.  The patient expressed a willingness to add therapy to help in her recovery journey.   She wanted to talk after group about trying to get into a shelter, and CSW encouraged her to consider returning to her facility, talking to her mother/guardian about living plans.   Therapeutic Modalities:   Motivational Interviewing Brief Solution-Focused Therapy  Ambrose MantleMareida Grossman-Orr, LCSW

## 2017-08-24 NOTE — Plan of Care (Signed)
  Activity: Sleeping patterns will improve 08/24/2017 2220 - Progressing by Delos HaringPhillips, Taequan Stockhausen A, RN Note Pt stated she was sleeping good   Coping: Ability to demonstrate self-control will improve 08/24/2017 2220 - Progressing by Delos HaringPhillips, Tydarius Yawn A, RN Note Pt has acted appropriate on the unit this evening, no physical outburst

## 2017-08-24 NOTE — Plan of Care (Signed)
Nurse discussed depression, anxiety, coping skills with patient.  

## 2017-08-24 NOTE — Progress Notes (Signed)
D: Pt denies SI/HI/AVH. Pt is pleasant and cooperative. Pt stated she was ready to D/C tomorrow. Pt visible on the unit with appropriate interaction.   A: Pt was offered support and encouragement. Pt was given scheduled medications. Pt was encourage to attend groups. Q 15 minute checks were done for safety.   R:Pt attends groups and interacts well with peers and staff. Pt is taking medication. Pt has no complaints.Pt receptive to treatment and safety maintained on unit.

## 2017-08-24 NOTE — Progress Notes (Signed)
[facility] MD Progress Note  08/24/2017 11:14 AM Brittany Palmer  MRN:  [id number] Subjective:   [age] y.oCaucasian female, single, in residential care. History of Autistic Spectrum Disorder. Presented to the hospital via emergency services. Patient was agitated at her group home. She was reported to have been threatening staff and peers at her group home. She scratched both hands with her nails as she was very upset. She expressed suicidal thoughts at her group home.  Chart reviewed today. Patient discussed at team today.  Staff reports she has interacting well with peers. She has not had any aggressive behavior. She has been eating her meals well. Sleeps well at night. She has not been observed to be internally stimulated. She has approached staff repeatedly about [facility]. Idea seems to have been suggested by a peer here.   Seen today. Says she is doing better. She is no longer feeling suicidal. She is no longer having any thoughts of violence. [facility] with her. She has no idea how the place is. I explained that her current home is far better. I encouraged her to reconsider going back there. Patient seems to be processing it. She is aware her legal guardian has the final say. She feels ready for discharge soon.  No evidence of psychosis. No evidence of mania. No evidence of depression.   Principal Problem: MDD (major depressive disorder), recurrent severe, without psychosis (HCC) Diagnosis:   Patient Active Problem List   Diagnosis Date Noted  . Autism spectrum disorder [F84.0] 05/17/2017  . MDD (major depressive disorder), recurrent severe, without psychosis (HCC) [F33.2] 05/16/2017   Total Time spent with patient: 20 minutes  Past Psychiatric History: As in H&P  Past Medical History:  Past Medical History:  Diagnosis Date  . Autism   . GERD (gastroesophageal reflux disease)   . Hypertension     Past Surgical History:  Procedure Laterality Date   . TONSILLECTOMY     Family History: History reviewed. No pertinent family history. Family Psychiatric  History: As in H&P Social History:  Social History   Substance and Sexual Activity  Alcohol Use No     Social History   Substance and Sexual Activity  Drug Use No    Social History   Socioeconomic History  . Marital status: Single    Spouse name: None  . Number of children: None  . Years of education: None  . Highest education level: None  Social Needs  . Financial resource strain: None  . Food insecurity - worry: None  . Food insecurity - inability: None  . Transportation needs - medical: None  . Transportation needs - non-medical: None  Occupational History  . None  Tobacco Use  . Smoking status: Never Smoker  . Smokeless tobacco: Never Used  Substance and Sexual Activity  . Alcohol use: No  . Drug use: No  . Sexual activity: No  Other Topics Concern  . None  Social History Narrative  . None   Additional Social History:          Sleep: Good  Appetite:  Good  Current Medications: Current Facility-Administered Medications  Medication Dose Route Frequency Provider Last Rate Last Dose  . acetaminophen (TYLENOL) tablet 650 mg  650 mg Oral Q6H PRN Rankin, Shuvon B, NP   650 mg at 08/21/17 0743  . albuterol (PROVENTIL HFA;VENTOLIN HFA) 108 (90 Base) MCG/ACT inhaler 2 puff  2 puff Inhalation Q6H PRN Kerry HoughSimon, Spencer E, PA-C  2 puff at 08/23/17 1458  . alum & mag hydroxide-simeth (MAALOX/MYLANTA) 200-200-20 MG/5ML suspension 30 mL  30 mL Oral Q4H PRN Rankin, Shuvon B, NP      . ARIPiprazole (ABILIFY) tablet 20 mg  20 mg Oral Daily Money, Travis B, FNP   20 mg at 08/24/17 0757  . gabapentin (NEURONTIN) capsule 100 mg  100 mg Oral TID PRN Money, Gerlene Burdock, FNP   100 mg at 08/23/17 1407  . hydrochlorothiazide (MICROZIDE) capsule 12.5 mg  12.5 mg Oral Daily Rankin, Shuvon B, NP   12.5 mg at 08/24/17 0757  . hydrOXYzine (ATARAX/VISTARIL) tablet 25 mg  25 mg Oral TID  PRN Kerry Hough, PA-C   25 mg at 08/23/17 2307  . magnesium hydroxide (MILK OF MAGNESIA) suspension 30 mL  30 mL Oral Daily PRN Rankin, Shuvon B, NP      . neomycin-bacitracin-polymyxin (NEOSPORIN) ointment   Topical BID Nwoko, Agnes I, NP      . pantoprazole (PROTONIX) EC tablet 40 mg  40 mg Oral Daily Rankin, Shuvon B, NP   40 mg at 08/24/17 0757  . traZODone (DESYREL) tablet 100 mg  100 mg Oral QHS PRN Rankin, Shuvon B, NP   100 mg at 08/23/17 2307  . venlafaxine XR (EFFEXOR-XR) 24 hr capsule 75 mg  75 mg Oral Q breakfast Money, Gerlene Burdock, FNP   75 mg at 08/24/17 0758  . verapamil (CALAN-SR) CR tablet 240 mg  240 mg Oral QHS Rankin, Shuvon B, NP   240 mg at 08/23/17 2307    Lab Results: No results found for this or any previous visit (from the past 48 hour(s)).  Blood Alcohol level:  No results found for: [facility]  Metabolic Disorder Labs: Lab Results  Component Value Date   HGBA1C 5.8 (H) 04/12/2011   MPG 120 (H) 04/12/2011   Lab Results  Component Value Date   PROLACTIN 30.2 04/12/2011   Lab Results  Component Value Date   CHOL 115 04/12/2011   TRIG 58 04/12/2011   HDL 47 04/12/2011   CHOLHDL 2.4 04/12/2011   VLDL 12 04/12/2011   LDLCALC 56 04/12/2011    Physical Findings: AIMS: Facial and Oral Movements Muscles of Facial Expression: None, normal Lips and Perioral Area: None, normal Jaw: None, normal Tongue: None, normal,Extremity Movements Upper (arms, wrists, hands, fingers): None, normal Lower (legs, knees, ankles, toes): None, normal, Trunk Movements Neck, shoulders, hips: None, normal, Overall Severity Severity of abnormal movements (highest score from questions above): None, normal Incapacitation due to abnormal movements: None, normal Patient's awareness of abnormal movements (rate only patient's report): No Awareness, Dental Status Current problems with teeth and/or dentures?: No Does patient usually wear dentures?: No  CIWA:    COWS:      Musculoskeletal: Strength & Muscle Tone: within normal limits Gait & Station: normal Patient leans: N/A  Psychiatric Specialty Exam: Physical Exam  Constitutional: She appears well-developed and well-nourished.  HENT:  Head: Normocephalic and atraumatic.  Respiratory: Effort normal.  Neurological: She is alert.  Psychiatric:  As above    ROS  Blood pressure 118/70, pulse (!) 103, temperature 98.6 F (37 C), temperature source Oral, resp. rate 16, height 5\' 5"  (1.651 m), weight 67.6 kg (149 lb), SpO2 100 %.Body mass index is 24.79 kg/m.  General Appearance: Neatly dressed, calm and cooperative. Polite.   Eye Contact:  Good  Speech:  Hoarse  Volume:  At baseline  Mood:  Feels okay here  Affect:  Mobilizing some positive  affect.   Thought Process:  Linear  Orientation:  Full (Time, Place, and Person)  Thought Content:   No delusional theme. No preoccupation with violent thoughts. No negative ruminations. No obsession.  No hallucination in any modality.   Suicidal Thoughts:  No  Homicidal Thoughts:  No  Memory:  Immediate;   Good Recent;   Fair Remote;   Fair  Judgement:  Fair  Insight:  Fair  Psychomotor Activity:  Normal  Concentration:  Concentration: Fair and Attention Span: Fair  Recall:  FiservFair  Fund of Knowledge:  Fair  Language:  Good  Akathisia:  Negative  Handed:    AIMS (if indicated):     Assets:  Financial Resources/Insurance Physical Health Resilience  ADL's:  Intact  Cognition:  WNL  Sleep:  Number of Hours: 6     Treatment Plan Summary:  Patient is at her baseline. She is not expressing any suicidal or homicidal thoughts. She is not psychotic or manic. She is not pervasively depressed. We would evaluate her further and explore disposition with her legal guardian.   Psychiatric: ASD BLPD  Medical: HTN GERD  Psychosocial:  Poor interpersonal relationship Loss of independence  PLAN: 1. Continue medications at current dose 2. Continue  to monitor mood, behavior and interaction with peers   Georgiann CockerVincent A Ordell Prichett, MD 08/24/2017, 11:14 AMPatient ID: Brittany Palmer, female   DOB: 24-Nov-1995, 21 y.o.   MRN: [id number]

## 2017-08-24 NOTE — Progress Notes (Signed)
D:  Patient's self inventory sheet, patient sleeps good, sleep medication helpful.  Good appetite, hyper energy level, good concentration.  Denied depression and hopeless.  Anxiety 3.  Denied withdrawals.  Denied SI.  Denied physical problems.  Denied physical pain.  Goal is find placement after discharge.  Plans to talk to SW.  Does have discharge plans. A:  Medications administered per MD orders.  Emotional support and encouragement given patient. R:  Denied SI and HI, contracts for safety.  Denied A/V hallucinations.  Safety maintained with 15 minute checks.

## 2017-08-25 NOTE — Progress Notes (Signed)
D:; Patient is sleeping and eating well.  Her appetite has improved.  Her energy level is normal and her concentration is good.  She denies any depressive symptoms and thoughts of self harm.  Patient continues to have homicidal thoughts toward the staff at her group home.  When asked what she would do if she saw someone from there, she stated, "I would hurt them."  She complained of some nausea and emesis after breakfast and she advised to let staff know if it continued.  Her goal today is to "find placement."  Patient does not want to return to the group home.  A: Continue to monitor medication management and MD orders.  Safety checks continued every 15 minutes per protocol.  Offer support and encouragement as needed.  R: Patient is receptive to staff; her behavior is appropriate.

## 2017-08-25 NOTE — Progress Notes (Signed)
[facility]  08/25/2017 10:59 AM Brittany JulyMacy Gude  MRN:  [id number] Subjective:   [age] y.oCaucasian female, single, in residential care. History of Autistic Spectrum Disorder. Presented to the hospital via emergency services. Patient was agitated at her group home. She was reported to have been threatening staff and peers at her group home. She scratched both hands with her nails as she was very upset. She expressed suicidal thoughts at her group home.  Chart reviewed today. Patient discussed at team today.  Staff reports she has been appropriate on the unit. No self harming behavior. No threats of acting out. She is polite and engages well. She has been eating her meals normally. She has not been observed to be internally stimulated. .   Seen today. Says she is doing well. No concerns lately. Now contemplating going back to her home. Not expressing any violent thoughts. Not expressing any suicidal thoughts. No evidence of psychosis. No evidence of mania. No evidence of depression. She has been tolerating her medications well.   Principal Problem: MDD (major depressive disorder), recurrent severe, without psychosis (HCC) Diagnosis:   Patient Active Problem List   Diagnosis Date Noted  . Autism spectrum disorder [F84.0] 05/17/2017  . MDD (major depressive disorder), recurrent severe, without psychosis (HCC) [F33.2] 05/16/2017   Total Time spent with patient: 20 minutes  Past Psychiatric History: As in H&P  Past Medical History:  Past Medical History:  Diagnosis Date  . Autism   . GERD (gastroesophageal reflux disease)   . Hypertension     Past Surgical History:  Procedure Laterality Date  . TONSILLECTOMY     Family History: History reviewed. No pertinent family history. Family Psychiatric  History: As in H&P Social History:  Social History   Substance and Sexual Activity  Alcohol Use No     Social History   Substance and Sexual Activity  Drug Use No    Social  History   Socioeconomic History  . Marital status: Single    Spouse name: None  . Number of children: None  . Years of education: None  . Highest education level: None  Social Needs  . Financial resource strain: None  . Food insecurity - worry: None  . Food insecurity - inability: None  . Transportation needs - medical: None  . Transportation needs - non-medical: None  Occupational History  . None  Tobacco Use  . Smoking status: Never Smoker  . Smokeless tobacco: Never Used  Substance and Sexual Activity  . Alcohol use: No  . Drug use: No  . Sexual activity: No  Other Topics Concern  . None  Social History Narrative  . None   Additional Social History:          Sleep: Good  Appetite:  Good  Current Medications: Current Facility-Administered Medications  Medication Dose Route Frequency Provider Last Rate Last Dose  . acetaminophen (TYLENOL) tablet 650 mg  650 mg Oral Q6H PRN Rankin, Shuvon B, NP   650 mg at 08/21/17 0743  . albuterol (PROVENTIL HFA;VENTOLIN HFA) 108 (90 Base) MCG/ACT inhaler 2 puff  2 puff Inhalation Q6H PRN Donell SievertSimon, Spencer E, PA-C   2 puff at 08/23/17 1458  . alum & mag hydroxide-simeth (MAALOX/MYLANTA) 200-200-20 MG/5ML suspension 30 mL  30 mL Oral Q4H PRN Rankin, Shuvon B, NP      . ARIPiprazole (ABILIFY) tablet 20 mg  20 mg Oral Daily Money, Travis B, FNP   20 mg at 08/25/17 0744  . gabapentin (NEURONTIN)  capsule 100 mg  100 mg Oral TID PRN Money, Gerlene Burdockravis B, FNP   100 mg at 08/23/17 1407  . hydrochlorothiazide (MICROZIDE) capsule 12.5 mg  12.5 mg Oral Daily Rankin, Shuvon B, NP   12.5 mg at 08/25/17 0745  . hydrOXYzine (ATARAX/VISTARIL) tablet 25 mg  25 mg Oral TID PRN Kerry HoughSimon, Spencer E, PA-C   25 mg at 08/24/17 2141  . magnesium hydroxide (MILK OF MAGNESIA) suspension 30 mL  30 mL Oral Daily PRN Rankin, Shuvon B, NP      . neomycin-bacitracin-polymyxin (NEOSPORIN) ointment   Topical BID Nwoko, Agnes I, NP      . pantoprazole (PROTONIX) EC tablet 40  mg  40 mg Oral Daily Rankin, Shuvon B, NP   40 mg at 08/25/17 0745  . traZODone (DESYREL) tablet 100 mg  100 mg Oral QHS PRN Rankin, Shuvon B, NP   100 mg at 08/24/17 2141  . venlafaxine XR (EFFEXOR-XR) 24 hr capsule 75 mg  75 mg Oral Q breakfast Money, Gerlene Burdockravis B, FNP   75 mg at 08/25/17 0744  . verapamil (CALAN-SR) CR tablet 240 mg  240 mg Oral QHS Rankin, Shuvon B, NP   240 mg at 08/24/17 2141    Lab Results: No results found for this or any previous visit (from the past 48 hour(s)).  Blood Alcohol level:  [facility] found [facility]: [facility]  Metabolic Disorder Labs: Lab Results  Component Value Date   HGBA1C 5.8 (H) 04/12/2011   MPG 120 (H) 04/12/2011   Lab Results  Component Value Date   PROLACTIN 30.2 04/12/2011   Lab Results  Component Value Date   CHOL 115 04/12/2011   TRIG 58 04/12/2011   HDL 47 04/12/2011   CHOLHDL 2.4 04/12/2011   VLDL 12 04/12/2011   LDLCALC 56 04/12/2011    Physical Findings: AIMS: Facial and Oral Movements Muscles of Facial Expression: None, normal Lips and Perioral Area: None, normal Jaw: None, normal Tongue: None, normal,Extremity Movements Upper (arms, wrists, hands, fingers): None, normal Lower (legs, knees, ankles, toes): None, normal, Trunk Movements Neck, shoulders, hips: None, normal, Overall Severity Severity of abnormal movements (highest score from questions above): None, normal Incapacitation due to abnormal movements: None, normal Patient's awareness of abnormal movements (rate only patient's report): No Awareness, Dental Status Current problems with teeth and/or dentures?: No Does patient usually wear dentures?: No  CIWA:    COWS:     Musculoskeletal: Strength & Muscle Tone: within normal limits Gait & Station: normal Patient leans: N/A  Psychiatric Specialty Exam: Physical Exam  Constitutional: She appears well-developed and well-nourished.  HENT:  Head: Normocephalic and atraumatic.  Respiratory: Effort normal.   Neurological: She is alert.  Psychiatric:  As above    ROS  Blood pressure 122/74, pulse (!) 108, temperature 98.8 F (37.1 C), resp. rate 16, height 5\' 5"  (1.651 m), weight 67.6 kg (149 lb), SpO2 100 %.Body mass index is 24.79 kg/m.  General Appearance: Neatly dressed, calm and cooperative. Polite.   Eye Contact:  Good  Speech:  Hoarse  Volume:  At baseline  Mood:  Better  Affect:  Mobilizing some positive affect.   Thought Process:  Linear  Orientation:  Full (Time, Place, and Person)  Thought Content:   No delusional theme. No preoccupation with violent thoughts. No negative ruminations. No obsession.  No hallucination in any modality.   Suicidal Thoughts:  No  Homicidal Thoughts:  No  Memory:  Immediate;   Good Recent;   Fair Remote;  Fair  Judgement:  Fair  Insight:  Fair  Psychomotor Activity:  Normal  Concentration:  Concentration: Fair and Attention Span: Fair  Recall:  [employer] of Knowledge:  Fair  Language:  Good  Akathisia:  Negative  Handed:    AIMS (if indicated):     Assets:  Financial Resources/Insurance Physical Health Resilience  ADL's:  Intact  Cognition:  WNL  Sleep:  Number of Hours: 6.75     Treatment Plan Summary:  Patient is at her baseline. She is not expressing any suicidal or homicidal thoughts. She is not psychotic or manic. She is not depressed. She is beginning to process return to her group home. Hopeful discharge back to her facility in a couple of days.   Psychiatric: ASD BLPD  Medical: HTN GERD  Psychosocial:  Poor interpersonal relationship Loss of independence  PLAN: 1. Continue medications at current dose 2. Continue to monitor mood, behavior and interaction with peers   Georgiann Cocker, MD 08/25/2017, 10:59 AMPatient ID: Brittany Palmer, female   DOB: 1995/10/04, 21 y.o.   MRN: [id number] Patient ID: Roselyn Doby, female   DOB: 1995/12/25, 21 y.o.   MRN: [id number]

## 2017-08-25 NOTE — Progress Notes (Signed)
Adult Psychoeducational Group Note  Date:  08/25/2017 Time:  4:43 AM  Group Topic/Focus:  Wrap-Up Group:   The focus of this group is to help patients review their daily goal of treatment and discuss progress on daily workbooks.  Participation Level:  Active  Participation Quality:  Appropriate  Affect:  Appropriate  Cognitive:  Appropriate  Insight: Appropriate  Engagement in Group:  Engaged  Modes of Intervention:  Discussion  Additional Comments:  Pt stated her goal for today was to find placement after discharge. Pt stated she accomplished her goal today. Pt rated her over all day a 10. Pt stated she attended all groups held today.  Brittany FurnaceChristopher  Brittany Palmer 08/25/2017, 4:43 AM

## 2017-08-25 NOTE — Progress Notes (Signed)
Recreation Therapy Notes  Date: 08/25/17 Time: 0930 Location: 300 Hall Group Room  Group Topic: Stress Management  Goal Area(s) Addresses:  Patient will verbalize importance of using healthy stress management.  Patient will identify positive emotions associated with healthy stress management.   Intervention: Stress Management  Activity :  Meditation.  LRT introduced the stress management technique of meditation.  LRT played a meditation from the Calm app.  Patients were to listen and follow along as meditation was played.  Education: Stress Management, Discharge Planning.   Education Outcome: Acknowledges edcuation/In group clarification offered/Needs additional education  Clinical Observations/Feedback: Pt did not attend group.   Caroll RancherMarjette Kea Callan, LRT/CTRS          Caroll RancherLindsay, Willam Munford A 08/25/2017 12:09 PM

## 2017-08-25 NOTE — Progress Notes (Signed)
Writer assume care for Pt. Pt is seen laying in bed with eyes closed. Respirations even and unlabored. Will monitor.

## 2017-08-25 NOTE — Plan of Care (Signed)
  Progressing Activity: Interest or engagement in activities will improve 08/25/2017 0949 - Progressing by Angela AdamBeaudry, Emery Binz E, RN Coping: Ability to demonstrate self-control will improve 08/25/2017 0949 - Progressing by Angela AdamBeaudry, Cynara Tatham E, RN Role Relationship: Ability to demonstrate positive changes in social behaviors and relationships will improve 08/25/2017 0949 - Progressing by Angela AdamBeaudry, Briannon Boggio E, RN Self-Concept: Ability to verbalize positive feelings about self will improve 08/25/2017 0949 - Progressing by Angela AdamBeaudry, Cambrea Kirt E, RN Level of anxiety will decrease 08/25/2017 0949 - Progressing by Angela AdamBeaudry, Suleiman Finigan E, RN Education: Ability to incorporate positive changes in behavior to improve self-esteem will improve 08/25/2017 0949 - Progressing by Angela AdamBeaudry, Dmarco Baldus E, RN Education: Ability to make informed decisions regarding treatment will improve 08/25/2017 0949 - Progressing by Angela AdamBeaudry, Carlotta Telfair E, RN Self-Concept: Ability to verbalize positive feelings about self will improve 08/25/2017 0949 - Progressing by Angela AdamBeaudry, Margaret Cockerill E, RN Spiritual Needs Ability to function at adequate level 08/25/2017 0949 - Progressing by Angela AdamBeaudry, Adleigh Mcmasters E, RN

## 2017-08-26 DIAGNOSIS — F419 Anxiety disorder, unspecified: Secondary | ICD-10-CM

## 2017-08-26 MED ORDER — GABAPENTIN 100 MG PO CAPS
200.0000 mg | ORAL_CAPSULE | Freq: Three times a day (TID) | ORAL | Status: DC
  Administered 2017-08-26 – 2017-08-28 (×7): 200 mg via ORAL
  Filled 2017-08-26 (×13): qty 2

## 2017-08-26 NOTE — Progress Notes (Signed)
D: Pt denies SI/HI/AVH. Pt is pleasant and cooperative. Pt visibly anxious, but seen pacing the hall and appeared to calm herself down.  A: Pt was offered support and encouragement. Pt was given scheduled medications. Pt was encourage to attend groups. Q 15 minute checks were done for safety.   R:Pt attends groups and interacts well with peers and staff. Pt is taking medication. Pt has no complaints at this time .Pt receptive to treatment and safety maintained on unit.
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
## 2017-08-26 NOTE — Progress Notes (Signed)
  DATA ACTION RESPONSE  Objective- Pt. is visible in the dayroom, seen interacting with peers. Presents with an animated affect and mood. Pt states she hopes to get into another group home.  Subjective- Denies having any SI/AVH/Pain at this time. Endorses HI towards residents at group home.Is cooperative and remains safe on the unit.  1:1 interaction in private to establish rapport. Encouragement, education, & support given from staff.  PRN albuterol, vistaril, and trazodone requested and will re-eval accordingly.   Safety maintained with Q 15 checks. Continue with POC.

## 2017-08-26 NOTE — Plan of Care (Signed)
  Education: Emotional status will improve 08/26/2017 2053 - Progressing by Delos HaringPhillips, Rehan Holness A, RN Note Pt stated she was doing fine this evening   Coping: Ability to cope will improve 08/26/2017 2053 - Progressing by Delos HaringPhillips, Elani Delph A, RN Note Pt seen pacing the unit this evening
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
## 2017-08-27 LAB — CBC WITH DIFFERENTIAL/PLATELET
Basophils Absolute: 0 10*3/uL (ref 0.0–0.1)
Basophils Relative: 0 %
Eosinophils Absolute: 0.2 10*3/uL (ref 0.0–0.7)
Eosinophils Relative: 2 %
HEMATOCRIT: 44.1 % (ref 36.0–46.0)
HEMOGLOBIN: 14.7 g/dL (ref 12.0–15.0)
LYMPHS ABS: 3.2 10*3/uL (ref 0.7–4.0)
LYMPHS PCT: 42 %
MCH: 30 pg (ref 26.0–34.0)
MCHC: 33.3 g/dL (ref 30.0–36.0)
MCV: 90 fL (ref 78.0–100.0)
MONO ABS: 0.5 10*3/uL (ref 0.1–1.0)
MONOS PCT: 7 %
NEUTROS ABS: 3.8 10*3/uL (ref 1.7–7.7)
NEUTROS PCT: 49 %
Platelets: 236 10*3/uL (ref 150–400)
RBC: 4.9 MIL/uL (ref 3.87–5.11)
RDW: 12.5 % (ref 11.5–15.5)
WBC: 7.6 10*3/uL (ref 4.0–10.5)

## 2017-08-27 LAB — BASIC METABOLIC PANEL
Anion gap: 8 (ref 5–15)
BUN: 9 mg/dL (ref 6–20)
CALCIUM: 8.9 mg/dL (ref 8.9–10.3)
CHLORIDE: 105 mmol/L (ref 101–111)
CO2: 28 mmol/L (ref 22–32)
CREATININE: 0.89 mg/dL (ref 0.44–1.00)
GFR calc Af Amer: >60 mL/min (ref >60)
GFR calc non Af Amer: >60 mL/min (ref >60)
GLUCOSE: 81 mg/dL (ref 65–99)
Potassium: 3.8 mmol/L (ref 3.5–5.1)
Sodium: 141 mmol/L (ref 135–145)

## 2017-08-27 LAB — PREGNANCY, URINE: Preg Test, Ur: NEGATIVE

## 2017-08-27 LAB — TSH: TSH: 1.436 u[IU]/mL (ref 0.350–4.500)

## 2017-08-27 NOTE — Progress Notes (Signed)
D: Patient has improved with her treatment.  She is smiling and pleasant.  Patient continues to ruminate over staff at her group home.  She does not want to return to the group home and she was violent toward them.  It is not certain that they will take the patient back.  She denies any thoughts of self harm, however, has passive thoughts of hurting some of the staff at the group home.  She is attending groups and appears engaged.  She is interacting well with staff and peers.  A: Continue to monitor medication management and MD orders.  Safety checks completed every 15 minutes per protocol.  Offer encouragement and support as needed.  R: Patient is receptive to staff; her behavior is appropriate.

## 2017-08-27 NOTE — Tx Team (Signed)
Interdisciplinary Treatment and Diagnostic Plan Update  08/27/2017 Time of Session: 1412 Brittany JulyMacy Coates MRN: [id number]  Principal Diagnosis: MDD (major depressive disorder), recurrent severe, without psychosis (HCC)  Secondary Diagnoses: Principal Problem:   MDD (major depressive disorder), recurrent severe, without psychosis (HCC) Active Problems:   Autism spectrum disorder   Current Medications:  Current Facility-Administered Medications  Medication Dose Route Frequency Provider Last Rate Last Dose  . acetaminophen (TYLENOL) tablet 650 mg  650 mg Oral Q6H PRN Rankin, Shuvon B, NP   650 mg at 08/21/17 0743  . albuterol (PROVENTIL HFA;VENTOLIN HFA) 108 (90 Base) MCG/ACT inhaler 2 puff  2 puff Inhalation Q6H PRN Donell SievertSimon, Spencer E, PA-C   2 puff at 08/26/17 1840  . alum & mag hydroxide-simeth (MAALOX/MYLANTA) 200-200-20 MG/5ML suspension 30 mL  30 mL Oral Q4H PRN Rankin, Shuvon B, NP      . ARIPiprazole (ABILIFY) tablet 20 mg  20 mg Oral Daily Money, Travis B, FNP   20 mg at 08/27/17 0737  . gabapentin (NEURONTIN) capsule 200 mg  200 mg Oral TID Cobos, Rockey SituFernando A, MD   200 mg at 08/27/17 1206  . hydrochlorothiazide (MICROZIDE) capsule 12.5 mg  12.5 mg Oral Daily Rankin, Shuvon B, NP   12.5 mg at 08/27/17 0738  . hydrOXYzine (ATARAX/VISTARIL) tablet 25 mg  25 mg Oral TID PRN Kerry HoughSimon, Spencer E, PA-C   25 mg at 08/26/17 2143  . magnesium hydroxide (MILK OF MAGNESIA) suspension 30 mL  30 mL Oral Daily PRN Rankin, Shuvon B, NP      . neomycin-bacitracin-polymyxin (NEOSPORIN) ointment   Topical BID Nwoko, Agnes I, NP      . pantoprazole (PROTONIX) EC tablet 40 mg  40 mg Oral Daily Rankin, Shuvon B, NP   40 mg at 08/27/17 0735  . traZODone (DESYREL) tablet 100 mg  100 mg Oral QHS PRN Rankin, Shuvon B, NP   100 mg at 08/26/17 2143  . venlafaxine XR (EFFEXOR-XR) 24 hr capsule 75 mg  75 mg Oral Q breakfast Money, Gerlene Burdockravis B, FNP   75 mg at 08/27/17 0735  . verapamil (CALAN-SR) CR tablet 240 mg  240 mg  Oral QHS Rankin, Shuvon B, NP   240 mg at 08/26/17 2142   PTA Medications: Medications Prior to Admission  Medication Sig Dispense Refill Last Dose  . albuterol (PROVENTIL HFA;VENTOLIN HFA) 108 (90 Base) MCG/ACT inhaler Inhale 2 puffs into the lungs every 6 (six) hours as needed for wheezing or shortness of breath.    Past Month at Unknown time  . ARIPiprazole (ABILIFY) 5 MG tablet Take 1 tablet (5 mg total) by mouth 2 (two) times daily. (Patient taking differently: Take 10 mg by mouth 2 (two) times daily. ) 60 tablet 0 08/19/2017 at Unknown time  . fluticasone (FLONASE) 50 MCG/ACT nasal spray Place 1 spray into both nostrils daily.   08/19/2017 at Unknown time  . hydrochlorothiazide (MICROZIDE) 12.5 MG capsule Take 12.5 mg by mouth daily.   08/19/2017 at Unknown time  . losartan (COZAAR) 25 MG tablet Take 25 mg by mouth daily.   08/19/2017 at Unknown time  . pantoprazole (PROTONIX) 40 MG tablet Take 1 tablet (40 mg total) by mouth daily. 30 tablet 0 08/19/2017 at Unknown time  . traZODone (DESYREL) 100 MG tablet Take 1 tablet (100 mg total) by mouth at bedtime as needed for sleep. 30 tablet 0 08/17/2017  . venlafaxine XR (EFFEXOR-XR) 37.5 MG 24 hr capsule Take 1 capsule (37.5 mg total) by mouth daily  with breakfast. (Patient taking differently: Take 150 mg by mouth daily with breakfast. ) 30 capsule 0 08/19/2017 at Unknown time  . verapamil (CALAN-SR) 240 MG CR tablet Take 240 mg by mouth at bedtime.   08/18/2017 at Unknown time    Patient Stressors: Other: Conflict with residents and staff at group home  Patient Strengths: Average or above average intelligence Communication skills Physical Health Supportive family/friends  Treatment Modalities: Medication Management, Group therapy, Case management,  1 to 1 session with clinician, Psychoeducation, Recreational therapy.   Physician Treatment Plan for Primary Diagnosis: MDD (major depressive disorder), recurrent severe, without psychosis  (HCC) Long Term Goal(s): Improvement in symptoms so as ready for discharge Improvement in symptoms so as ready for discharge   Short Term Goals: Ability to identify changes in lifestyle to reduce recurrence of condition will improve Ability to verbalize feelings will improve Ability to disclose and discuss suicidal ideas Ability to demonstrate self-control will improve Ability to identify and develop effective coping behaviors will improve Ability to maintain clinical measurements within normal limits will improve Compliance with prescribed medications will improve Ability to identify changes in lifestyle to reduce recurrence of condition will improve Ability to verbalize feelings will improve Ability to disclose and discuss suicidal ideas Ability to demonstrate self-control will improve Ability to identify and develop effective coping behaviors will improve Ability to maintain clinical measurements within normal limits will improve Compliance with prescribed medications will improve Ability to identify triggers associated with substance abuse/mental health issues will improve  Medication Management: Evaluate patient's response, side effects, and tolerance of medication regimen.  Therapeutic Interventions: 1 to 1 sessions, Unit Group sessions and Medication administration.  Evaluation of Outcomes: Progressing   Physician Treatment Plan for Secondary Diagnosis: Principal Problem:   MDD (major depressive disorder), recurrent severe, without psychosis (HCC) Active Problems:   Autism spectrum disorder  Medication Management: Evaluate patient's response, side effects, and tolerance of medication regimen.  Therapeutic Interventions: 1 to 1 sessions, Unit Group sessions and Medication administration.  Evaluation of Outcomes: Progressing   RN Treatment Plan for Primary Diagnosis: MDD (major depressive disorder), recurrent severe, without psychosis (HCC) Long Term Goal(s): Knowledge of  disease and therapeutic regimen to maintain health will improve  Short Term Goals: Ability to identify and develop effective coping behaviors will improve and Compliance with prescribed medications will improve  Medication Management: RN will administer medications as ordered by provider, will assess and evaluate patient's response and provide education to patient for prescribed medication. RN will report any adverse and/or side effects to prescribing provider.  Therapeutic Interventions: 1 on 1 counseling sessions, Psychoeducation, Medication administration, Evaluate responses to treatment, Monitor vital signs and CBGs as ordered, Perform/monitor CIWA, COWS, AIMS and Fall Risk screenings as ordered, Perform wound care treatments as ordered.  Evaluation of Outcomes: Progressing  LCSW Treatment Plan for Primary Diagnosis: MDD (major depressive disorder), recurrent severe, without psychosis (HCC) Long Term Goal(s): Safe transition to appropriate next level of care at discharge, Engage patient in therapeutic group addressing interpersonal concerns.  Short Term Goals: Engage patient in aftercare planning with referrals and resources, Increase social support and Increase skills for wellness and recovery  Therapeutic Interventions: Assess for all discharge needs, 1 to 1 time with Social worker, Explore available resources and support systems, Assess for adequacy in community support network, Educate family and significant other(s) on suicide prevention, Complete Psychosocial Assessment, Interpersonal group therapy.  Evaluation of Outcomes: Progressing   Progress in Treatment: Attending groups: Yes Participating in groups: YEs  Taking medication as prescribed: Yes. Toleration medication: Yes. Family/Significant other contact made: SPE completed with pt's mother/legal guardian.  Patient understands diagnosis: Yes. Discussing patient identified problems/goals with staff: Yes. Medical problems  stabilized or resolved: Yes. Denies suicidal/homicidal ideation: Yes. Issues/concerns per patient self-inventory: No. Other: none  New problem(s) identified: No, Describe:  none  New Short Term/Long Term Goal(s): medication management for mood stabilization; elimination of SI thoughts; development of comprehensive mental wellness plan.   Discharge Plan or Barriers: [facility]; [facility]   Reason for Continuation of Hospitalization: Depression Medication stabilization  Estimated Length of Stay: Thursday, 08/28/17  Attendees: Patient: 08/27/2017   Physician: Dr Jama Flavorsobos, MD 08/27/2017  Nursing: Joslyn Devonaroline Beaudry, RN 08/27/2017   RN Care Manager: 08/27/2017   Social Worker: Daleen SquibbGreg Nnamdi Dacus, LCSW 08/27/2017   Recreational Therapist:  08/27/2017   Other:  08/27/2017   Other:  08/27/2017   Other: 08/27/2017          Scribe for Treatment Team: Lorri FrederickWierda, Janeliz Prestwood Jon, LCSW 08/27/2017 2:12 PM

## 2017-08-27 NOTE — Progress Notes (Signed)
[facility] Group Notes:  (Nursing/MHT/Case Management/Adjunct)  Date:  08/27/2017  Time:  5:00 PM  Type of Therapy:  Psychoeducational Skills  Participation Level:  Active  Participation Quality:  Appropriate and Attentive  Affect:  Appropriate  Cognitive:  Alert and Appropriate  Insight:  Appropriate  Engagement in Group:  Engaged  Modes of Intervention:  Discussion, Education and Exploration  Summary of Progress/Problems: Today's group focused on crisis. Patients were asked to tell what brought them into the hospital, what are some barriers that they are facing, and what changes can they make when they leave here.  Patient attended and participated in group.  Patient was engaged during group, shared her story, and offered feedback to others.  Larry SierrasMiddleton, Brigida Scotti P 08/27/2017, 5:00 PM

## 2017-08-27 NOTE — Progress Notes (Signed)
Recreation Therapy Notes  Date: 08/27/17 Time: 0930 Location: [address] Dayroom  Group Topic: Stress Management  Goal Area(s) Addresses:  Patient will verbalize importance of using healthy stress management.  Patient will identify positive emotions associated with healthy stress management.   Intervention: Stress Management  Activity :  Meditation.  LRT read a meditation to help patients visualize the strength and endurance mountains have and how they can use that same strength to get through the things they are faced with.  Education:  Stress Management, Discharge Planning.   Education Outcome: Acknowledges edcuation/In group clarification offered/Needs additional education  Clinical Observations/Feedback: Pt did not attend group.    Bryan Omura, LRT/CTRS         Eliyas Suddreth A 08/27/2017 11:23 AM 

## 2017-08-27 NOTE — Progress Notes (Signed)
D: Pt on unit c/o breating distress.  Pt appears to be breating fine and is instructed to sit down and relax.  Pt is anxious about returning to former group home and wants someplace else to live.  A: Introduced self to pt.  Actively listened to pt and offered support and encouragement. Medications administered per order.  Q15 minute safety checks maintained. R: Pt is safe on the unit.  Pt is compliant with medications.  Pt verbally contracts for safety.  Will continue to monitor and assess

## 2017-08-27 NOTE — Progress Notes (Addendum)
CSW spoke to Bruno at the [facility] where pt lives.  [phone].  Cecille Rubin has been with pt for 4 years and is planning to have her return.  Cecille Rubin reports pt met a boy while at [employer] a few weeks ago.  Cecille Rubin has pt phone and found that pt has been texting this boy repeatedly since her discharge.  Cecille Rubin thinks pt may have been trying to find a way back to [facility] in order to see this boy again.  Cecille Rubin is out of town but can have staff pick her up tomorrow.  Please call the above number when pt is ready.  They do not need an FL2. Winferd Humphrey, MSW, LCSW Clinical Social Worker 08/27/2017 12:55 PM   CSW also left message for legal guardian/parent Anysha Frappier, [phone].  CSW informed of possible discharge tomorrow and also asked about releases that had been emailed to her last week. Winferd Humphrey, MSW, LCSW Clinical Social Worker 08/27/2017 12:58 PM

## 2017-08-27 NOTE — Progress Notes (Signed)
[facility] MD Progress Note  08/27/2017 11:36 AM Brittany Palmer  MRN:  [id number]   Subjective:  Patient reports that she feels great today. She had a good Christmas here and denies any SI/HI/AVH and contracts for safety. She continues to say that she does not want to go back to [place], but she also understands that her mother is her guardian and she may have to until her mother can find her another place to live. She would just prefer to get to live somewhere else and she is afraid she may not be allowed back there after this incident.  Objective: Patient's chart and findings reviewed and discussed with treatment team. Patient is pleasant and cooperative. She has been in the day room interacting appropriately. She has been attending groups,. I attempted to contact [facility], but no one answered. CSW requested to contact [facility] to see if patient is allowed back there. Patient will continue current medications and more than likely discharge back to [facility].  Principal Problem: MDD (major depressive disorder), recurrent severe, without psychosis ([place]) Diagnosis:   Patient Active Problem List   Diagnosis Date Noted  . Autism spectrum disorder [F84.0] 05/17/2017  . MDD (major depressive disorder), recurrent severe, without psychosis ([place]) [F33.2] 05/16/2017   Total Time spent with patient: 15 minutes  Past Psychiatric History: See H&P  Past Medical History:  Past Medical History:  Diagnosis Date  . Autism   . GERD (gastroesophageal reflux disease)   . Hypertension     Past Surgical History:  Procedure Laterality Date  . TONSILLECTOMY     Family History: History reviewed. No pertinent family history. Family Psychiatric  History: See H&P Social History:  Social History   Substance and Sexual Activity  Alcohol Use No     Social History   Substance and Sexual Activity  Drug Use No    Social History   Socioeconomic History  . Marital status: Single    Spouse name: None  . Number of  children: None  . Years of education: None  . Highest education level: None  Social Needs  . Financial resource strain: None  . Food insecurity - worry: None  . Food insecurity - inability: None  . Transportation needs - medical: None  . Transportation needs - non-medical: None  Occupational History  . None  Tobacco Use  . Smoking status: Never Smoker  . Smokeless tobacco: Never Used  Substance and Sexual Activity  . Alcohol use: No  . Drug use: No  . Sexual activity: No  Other Topics Concern  . None  Social History Narrative  . None   Additional Social History:                         Sleep: Good  Appetite:  Good  Current Medications: Current Facility-Administered Medications  Medication Dose Route Frequency Provider Last Rate Last Dose  . acetaminophen (TYLENOL) tablet 650 mg  650 mg Oral Q6H PRN Rankin, Shuvon B, NP   650 mg at 08/21/17 0743  . albuterol (PROVENTIL HFA;VENTOLIN HFA) 108 (90 Base) MCG/ACT inhaler 2 puff  2 puff Inhalation Q6H PRN Patriciaann Clan E, PA-C   2 puff at 08/26/17 1840  . alum & mag hydroxide-simeth (MAALOX/MYLANTA) 200-200-20 MG/5ML suspension 30 mL  30 mL Oral Q4H PRN Rankin, Shuvon B, NP      . ARIPiprazole (ABILIFY) tablet 20 mg  20 mg Oral Daily Money, Travis B, FNP   20 mg at 08/27/17 0737  .  gabapentin (NEURONTIN) capsule 200 mg  200 mg Oral TID Cobos, Myer Peer, MD   200 mg at 08/27/17 3532  . hydrochlorothiazide (MICROZIDE) capsule 12.5 mg  12.5 mg Oral Daily Rankin, Shuvon B, NP   12.5 mg at 08/27/17 0738  . hydrOXYzine (ATARAX/VISTARIL) tablet 25 mg  25 mg Oral TID PRN Laverle Hobby, PA-C   25 mg at 08/26/17 2143  . magnesium hydroxide (MILK OF MAGNESIA) suspension 30 mL  30 mL Oral Daily PRN Rankin, Shuvon B, NP      . neomycin-bacitracin-polymyxin (NEOSPORIN) ointment   Topical BID Nwoko, Agnes I, NP      . pantoprazole (PROTONIX) EC tablet 40 mg  40 mg Oral Daily Rankin, Shuvon B, NP   40 mg at 08/27/17 0735  .  traZODone (DESYREL) tablet 100 mg  100 mg Oral QHS PRN Rankin, Shuvon B, NP   100 mg at 08/26/17 2143  . venlafaxine XR (EFFEXOR-XR) 24 hr capsule 75 mg  75 mg Oral Q breakfast Money, Lowry Ram, FNP   75 mg at 08/27/17 0735  . verapamil (CALAN-SR) CR tablet 240 mg  240 mg Oral QHS Rankin, Shuvon B, NP   240 mg at 08/26/17 2142    Lab Results:  Results for orders placed or performed during the hospital encounter of 08/19/17 (from the past 48 hour(s))  CBC with Differential/Platelet     Status: None   Collection Time: 08/27/17  6:09 AM  Result Value Ref Range   WBC 7.6 4.0 - 10.5 K/uL   RBC 4.90 3.87 - 5.11 MIL/uL   Hemoglobin 14.7 12.0 - 15.0 g/dL   HCT 44.1 36.0 - 46.0 %   MCV 90.0 78.0 - 100.0 fL   MCH 30.0 26.0 - 34.0 pg   MCHC 33.3 30.0 - 36.0 g/dL   RDW 12.5 11.5 - 15.5 %   Platelets 236 150 - 400 K/uL   Neutrophils Relative % 49 %   Neutro Abs 3.8 1.7 - 7.7 K/uL   Lymphocytes Relative 42 %   Lymphs Abs 3.2 0.7 - 4.0 K/uL   Monocytes Relative 7 %   Monocytes Absolute 0.5 0.1 - 1.0 K/uL   Eosinophils Relative 2 %   Eosinophils Absolute 0.2 0.0 - 0.7 K/uL   Basophils Relative 0 %   Basophils Absolute 0.0 0.0 - 0.1 K/uL    Comment: Performed at [facility], [place] [address] [place], [place] [address]  Basic metabolic panel     Status: None   Collection Time: 08/27/17  6:09 AM  Result Value Ref Range   Sodium 141 135 - 145 mmol/L   Potassium 3.8 3.5 - 5.1 mmol/L   Chloride 105 101 - 111 mmol/L   CO2 28 22 - 32 mmol/L   Glucose, Bld 81 65 - 99 mg/dL   BUN 9 6 - 20 mg/dL   Creatinine, Ser 0.89 0.44 - 1.00 mg/dL   Calcium 8.9 8.9 - 10.3 mg/dL   GFR calc non Af Amer >60 >60 mL/min   GFR calc Af Amer >60 >60 mL/min    Comment: (NOTE) The eGFR has been calculated using the CKD EPI equation. This calculation has not been validated in all clinical situations. eGFR's persistently <60 mL/min signify possible Chronic Kidney Disease.    Anion gap 8 5 - 15     Comment: Performed at [facility], [place] [address] [place], [place] [address]  TSH     Status: None   Collection  Time: 08/27/17  6:09 AM  Result Value Ref Range   TSH 1.436 0.350 - 4.500 uIU/mL    Comment: Performed by a 3rd Generation assay with a functional sensitivity of <=0.01 uIU/mL. Performed at [facility], [place] [address] [place], [place] [address]     Blood Alcohol level:  No results found for: [facility]  Metabolic Disorder Labs: Lab Results  Component Value Date   HGBA1C 5.8 (H) 04/12/2011   MPG 120 (H) 04/12/2011   Lab Results  Component Value Date   PROLACTIN 30.2 04/12/2011   Lab Results  Component Value Date   CHOL 115 04/12/2011   TRIG 58 04/12/2011   HDL 47 04/12/2011   CHOLHDL 2.4 04/12/2011   VLDL 12 04/12/2011   LDLCALC 56 04/12/2011    Physical Findings: AIMS: Facial and Oral Movements Muscles of Facial Expression: None, normal Lips and Perioral Area: None, normal Jaw: None, normal Tongue: None, normal,Extremity Movements Upper (arms, wrists, hands, fingers): None, normal Lower (legs, knees, ankles, toes): None, normal, Trunk Movements Neck, shoulders, hips: None, normal, Overall Severity Severity of abnormal movements (highest score from questions above): None, normal Incapacitation due to abnormal movements: None, normal Patient's awareness of abnormal movements (rate only patient's report): No Awareness, Dental Status Current problems with teeth and/or dentures?: No Does patient usually wear dentures?: No  CIWA:    COWS:     Musculoskeletal: Strength & Muscle Tone: within normal limits Gait & Station: normal Patient leans: N/A  Psychiatric Specialty Exam: Physical Exam  Nursing note and vitals reviewed. Constitutional: She is oriented to person, place, and time. She appears well-developed and well-nourished.  Respiratory: Effort normal.  Musculoskeletal: Normal range of motion.  Neurological: She is  alert and oriented to person, place, and time.  Skin: Skin is warm.    Review of Systems  Constitutional: Negative.   HENT: Negative.   Eyes: Negative.   Respiratory: Negative.   Cardiovascular: Negative.   Gastrointestinal: Negative.   Genitourinary: Negative.   Musculoskeletal: Negative.   Skin: Negative.   Neurological: Negative.   Endo/Heme/Allergies: Negative.   Psychiatric/Behavioral: Negative.     Blood pressure 118/75, pulse (!) 101, temperature 98.4 F (36.9 C), temperature source Oral, resp. rate 16, height '5\' 5"'$  (1.651 m), weight 67.6 kg (149 lb), SpO2 99 %.Body mass index is 24.79 kg/m.  General Appearance: Casual  Eye Contact:  Good  Speech:  Clear and Coherent and Normal Rate  Volume:  Increased  Mood:  Euthymic  Affect:  Appropriate  Thought Process:  Goal Directed and Descriptions of Associations: Intact  Orientation:  Full (Time, Place, and Person)  Thought Content:  WDL  Suicidal Thoughts:  No  Homicidal Thoughts:  No  Memory:  Immediate;   Good Recent;   Good Remote;   Good  Judgement:  Fair  Insight:  Good  Psychomotor Activity:  Normal  Concentration:  Concentration: Good and Attention Span: Good  Recall:  Good  Fund of Knowledge:  Good  Language:  Good  Akathisia:  No  Handed:  Right  AIMS (if indicated):     Assets:  Communication Skills Desire for Improvement Financial Resources/Insurance Housing Physical Health Social Support Transportation  ADL's:  Intact  Cognition:  WNL  Sleep:  Number of Hours: 6.75   Problems Addressed: MDD severe ASD  Treatment Plan Summary: Daily contact with patient to assess and evaluate symptoms and progress in treatment, Medication management and Plan is to:  -Continue Abilify 20 mg PO Daily for  mood stability -Continue Gabapentin 600 mg PO TID  -Continue Effexor-XR 75 mg PO Daily for mood stability -Encourage group therapy participation -CSW to contact [employer] to see if patient can  return  Lewis Shock, FNP 08/27/2017, 11:36 AM   Agree with NP Progress Note

## 2017-08-27 NOTE — Plan of Care (Signed)
  Progressing Activity: Interest or engagement in activities will improve 08/27/2017 1036 - Progressing by Angela AdamBeaudry, Jayde Mcallister E, RN Sleeping patterns will improve 08/27/2017 1036 - Progressing by Angela AdamBeaudry, Jarion Hawthorne E, RN Education: Emotional status will improve 08/27/2017 1036 - Progressing by Angela AdamBeaudry, Marcelo Ickes E, RN

## 2017-08-28 MED ORDER — ARIPIPRAZOLE 20 MG PO TABS: 20.0000 mg | ORAL_TABLET | Freq: Every day | ORAL | 0 refills | Status: DC

## 2017-08-28 MED ORDER — HYDROXYZINE HCL 25 MG PO TABS: 25.0000 mg | ORAL_TABLET | Freq: Three times a day (TID) | ORAL | 0 refills | Status: DC | PRN

## 2017-08-28 MED ORDER — GABAPENTIN 100 MG PO CAPS: 200.0000 mg | ORAL_CAPSULE | Freq: Three times a day (TID) | ORAL | 0 refills | Status: DC

## 2017-08-28 MED ORDER — TRAZODONE HCL 100 MG PO TABS: 100.0000 mg | ORAL_TABLET | Freq: Every evening | ORAL | 0 refills | Status: DC | PRN

## 2017-08-28 MED ORDER — VENLAFAXINE HCL ER 75 MG PO CP24: 75.0000 mg | ORAL_CAPSULE | Freq: Every day | ORAL | 0 refills | Status: DC

## 2017-08-28 NOTE — Discharge Summary (Signed)
Physician Discharge Summary Note  Patient:  Brittany Palmer is an 21 y.o., female MRN:  [id number] DOB:  1996/02/08 Patient phone:  [phone] (home)  Patient address:   [place] [address],  Total Time spent with patient: 20 minutes  Date of Admission:  08/19/2017 Date of Discharge: 08/28/17   Reason for Admission:  Agitation, threatening staff and self harm  Principal Problem: MDD (major depressive disorder), recurrent severe, without psychosis [facility]) Discharge Diagnoses: Patient Active Problem List   Diagnosis Date Noted  . Autism spectrum disorder [F84.0] 05/17/2017  . MDD (major depressive disorder), recurrent severe, without psychosis ([place]) [F33.2] 05/16/2017    Past Psychiatric History: ASD with Borderline PD. Patient was recently discharged from our unit. Past history of deliberate self harming behavior  Past Medical History:  Past Medical History:  Diagnosis Date  . Autism   . GERD (gastroesophageal reflux disease)   . Hypertension     Past Surgical History:  Procedure Laterality Date  . TONSILLECTOMY     Family History: History reviewed. No pertinent family history. Family Psychiatric  History: Sister committed suicide in March this year  Social History:  Social History   Substance and Sexual Activity  Alcohol Use No     Social History   Substance and Sexual Activity  Drug Use No    Social History   Socioeconomic History  . Marital status: Single    Spouse name: None  . Number of children: None  . Years of education: None  . Highest education level: None  Social Needs  . Financial resource strain: None  . Food insecurity - worry: None  . Food insecurity - inability: None  . Transportation needs - medical: None  . Transportation needs - non-medical: None  Occupational History  . None  Tobacco Use  . Smoking status: Never Smoker  . Smokeless tobacco: Never Used  Substance and Sexual Activity  . Alcohol use: No  . Drug  use: No  . Sexual activity: No  Other Topics Concern  . None  Social History Narrative  . None    Hospital Course:   08/20/17 [facility] MD Assessment: 55 y.oCaucasian female, single, in residential care. History of Autistic Spectrum Disorder. Presented to the hospital via emergency services. Patient was agitated at her group home. She was reported to have been threatening staff and peers at her group home. She scratched both hands with her nails as she was very upset. She expressed suicidal thoughts at her group home. Patient has a legal guardian. She has been adherent with her medications. At interview, she tells me that some other girls at the home criticized her. They felt she was seeking excessive attention. Says that got her very upset. Says she felt like hurting them. Patient says her behavior had consequences at the group home. Says her cell phone and sunglasses was taken away. That made her more upset with staff. Patient says she is still mad at them. Says she has not been eating much. She feels she can no longer go back there. Very focused on finding a new group home. Patient denies any hallucinations. She denies any feelings of external influence. She has no violent thoughts towards these people here. Says she sometimes feels like hurting herself as she is still mad with them. No substance use.  No access to weapons.  Patient remained on the [facility] unit for 8 days and stabilized with medication and therapy. Patient was continued on Abilify and titrated to 20  mg Daily, Gabapentin 200 mg TID, Effexor-XR and titrataed to 75 mg Daily, and Trazodone 100 mg QHS PRN. Patient continued her medical medications as well. Patient was accepted back at [facility] and will be returning there. Patient presents requesting to be be moved to another facility each time she has been here, but her mother is still her legal guardian and the patient understands this. There was a report that the patient met a  boy at [facility] and that she may be trying to get back to [place] to see this boy again. Patient was not eating or drinking upon arrival to unit, but with encouragement began eating the next morning. She showed improvement with improved mood, affect, sleep, appetite, and interaction. She was seen in the day room interacting with peers and staff appropriately. She seemed to enjoy the stay a little too much and wanted to stay longer, but this was not needed for stabilization. Patient was seen attending groups and participating. Patient denied any SI/HI/AVH and contracts for safety. Patient is provided with prescriptions for her medications upon discharge. She is being transported to [facility] by that facility. Patient will be following up at [facility] services.    Physical Findings: AIMS: Facial and Oral Movements Muscles of Facial Expression: None, normal Lips and Perioral Area: None, normal Jaw: None, normal Tongue: None, normal,Extremity Movements Upper (arms, wrists, hands, fingers): None, normal Lower (legs, knees, ankles, toes): None, normal, Trunk Movements Neck, shoulders, hips: None, normal, Overall Severity Severity of abnormal movements (highest score from questions above): None, normal Incapacitation due to abnormal movements: None, normal Patient's awareness of abnormal movements (rate only patient's report): No Awareness, Dental Status Current problems with teeth and/or dentures?: No Does patient usually wear dentures?: No  CIWA:    COWS:     Musculoskeletal: Strength & Muscle Tone: within normal limits Gait & Station: normal Patient leans: N/A  Psychiatric Specialty Exam: Physical Exam  Nursing note and vitals reviewed. Constitutional: She is oriented to person, place, and time. She appears well-developed and well-nourished.  Respiratory: Effort normal.  Musculoskeletal: Normal range of motion.  Neurological: She is alert and oriented to person,  place, and time.  Skin: Skin is warm.    ROS  Blood pressure 118/75, pulse (!) 101, temperature 98.4 F (36.9 C), temperature source Oral, resp. rate 16, height '5\' 5"'$  (1.651 m), weight 67.6 kg (149 lb), SpO2 99 %.Body mass index is 24.79 kg/m.  General Appearance: Casual  Eye Contact:  Good  Speech:  Clear and Coherent and Normal Rate  Volume:  Increased  Mood:  Euthymic  Affect:  Appropriate  Thought Process:  Goal Directed and Descriptions of Associations: Intact  Orientation:  Full (Time, Place, and Person)  Thought Content:  WDL  Suicidal Thoughts:  No  Homicidal Thoughts:  No  Memory:  Immediate;   Good Recent;   Good Remote;   Good  Judgement:  Fair  Insight:  Fair  Psychomotor Activity:  Normal  Concentration:  Concentration: Good and Attention Span: Good  Recall:  Good  Fund of Knowledge:  Fair  Language:  Good  Akathisia:  No  Handed:  Right  AIMS (if indicated):     Assets:  Financial Resources/Insurance Housing Social Support Transportation  ADL's:  Intact  Cognition:  WNL  Sleep:  Number of Hours: 6.75     Have you used any form of tobacco in the last 30 days? (Cigarettes, Smokeless Tobacco, Cigars, and/or Pipes): No  Has this patient used any form of tobacco in the last 30 days? (Cigarettes, Smokeless Tobacco, Cigars, and/or Pipes) Yes, No  Blood Alcohol level:  No results found for: [facility]  Metabolic Disorder Labs:  Lab Results  Component Value Date   HGBA1C 5.8 (H) 04/12/2011   MPG 120 (H) 04/12/2011   Lab Results  Component Value Date   PROLACTIN 30.2 04/12/2011   Lab Results  Component Value Date   CHOL 115 04/12/2011   TRIG 58 04/12/2011   HDL 47 04/12/2011   CHOLHDL 2.4 04/12/2011   VLDL 12 04/12/2011   LDLCALC 56 04/12/2011    See Psychiatric Specialty Exam and Suicide Risk Assessment completed by Attending Physician prior to discharge.  Discharge destination:  Home  Is patient on multiple antipsychotic therapies at discharge:   No   Has Patient had three or more failed trials of antipsychotic monotherapy by history:  No  Recommended Plan for Multiple Antipsychotic Therapies: NA   Allergies as of 08/28/2017      Reactions   Lisinopril Cough   Risperdal [risperidone] Other (See Comments)   Twitching of mouth       Medication List    TAKE these medications     Indication  albuterol 108 (90 Base) MCG/ACT inhaler Commonly known as:  PROVENTIL HFA;VENTOLIN HFA Inhale 2 puffs into the lungs every 6 (six) hours as needed for wheezing or shortness of breath.  Indication:  Asthma   ARIPiprazole 20 MG tablet Commonly known as:  ABILIFY Take 1 tablet (20 mg total) by mouth daily. For mood stability What changed:    medication strength  how much to take  when to take this  additional instructions  Indication:  Major Depressive Disorder   fluticasone 50 MCG/ACT nasal spray Commonly known as:  FLONASE Place 1 spray into both nostrils daily.  Indication:  Signs and Symptoms of Nose Diseases   gabapentin 100 MG capsule Commonly known as:  NEURONTIN Take 2 capsules (200 mg total) by mouth 3 (three) times daily.  Indication:  Aggressive Behavior, Agitation   hydrochlorothiazide 12.5 MG capsule Commonly known as:  MICROZIDE Take 12.5 mg by mouth daily.  Indication:  High Blood Pressure Disorder   hydrOXYzine 25 MG tablet Commonly known as:  ATARAX/VISTARIL Take 1 tablet (25 mg total) by mouth 3 (three) times daily as needed for anxiety.  Indication:  Feeling Anxious   losartan 25 MG tablet Commonly known as:  COZAAR Take 25 mg by mouth daily.  Indication:  High Blood Pressure Disorder   pantoprazole 40 MG tablet Commonly known as:  PROTONIX Take 1 tablet (40 mg total) by mouth daily.  Indication:  Gastroesophageal Reflux Disease   traZODone 100 MG tablet Commonly known as:  DESYREL Take 1 tablet (100 mg total) by mouth at bedtime as needed for sleep.  Indication:  Trouble Sleeping    venlafaxine XR 75 MG 24 hr capsule Commonly known as:  EFFEXOR-XR Take 1 capsule (75 mg total) by mouth daily with breakfast. For mood stability What changed:    medication strength  how much to take  additional instructions  Indication:  Major Depressive Disorder   verapamil 240 MG CR tablet Commonly known as:  CALAN-SR Take 240 mg by mouth at bedtime.  Indication:  High Blood Pressure of Unknown Cause      Follow-up Information    [facility] Follow up on 09/01/2017.   Why:  Hospital follow-up on Monday, 12/31 at 1:00PM. Thank you.  Contact information: [place] [address] [phone]           Follow-up recommendations:  Continue activity as tolerated. Continue diet as recommended by your PCP. Ensure to keep all appointments with outpatient providers.  Comments:  Patient is instructed prior to discharge to: Take all medications as prescribed by his/her mental healthcare provider. Report any adverse effects and or reactions from the medicines to his/her outpatient provider promptly. Patient has been instructed & cautioned: To not engage in alcohol and or illegal drug use while on prescription medicines. In the event of worsening symptoms, patient is instructed to call the crisis hotline, 911 and or go to the nearest ED for appropriate evaluation and treatment of symptoms. To follow-up with his/her primary care provider for your other medical issues, concerns and or health care needs.    Signed: [place], FNP 08/28/2017, 8:07 AM   Patient seen, Suicide Assessment Completed.  Disposition Plan Reviewed

## 2017-08-28 NOTE — Progress Notes (Signed)
Patient ID: Brittany Palmer, female   DOB: [birth year], 21 y.o.   MRN: [id number]  Discharge Note- Belongings returned to patient at time of discharge. Discharge instructions and medications were reviewed with patient. Patient verbalized understanding of both medications and discharge instructions. Patient discharged to lobby where her ride was waiting, from her group home. She left in no apparent distress. Q15 minute safety checks maintained until time of discharge.

## 2017-08-28 NOTE — Progress Notes (Signed)
Patient ID: Brittany Palmer, female   DOB: 1996/06/24, 21 y.o.   MRN: [id number]  DAR: Pt. Denies SI/HI and A/V Hallucinations. He reports that his sleep last night was good, his appetite is good, his energy level is low, and concentration is poor. She rates her depression level 0/10, her hopelessness level 0/10, and her anxiety level 5/10. Patient reports that she has a headache and received PRN Tylenol which provided some relief. Support and encouragement provided to the patient. Scheduled medication administered to patient per physician's orders. Patient is receptive and cooperative. She is seen in her room throughout most of the morning. She reports that she is looking forward to discharge. Q15 minute checks are maintained for safety.

## 2017-08-28 NOTE — Progress Notes (Signed)
  [facility] :  Will you be returning to the same living situation after discharge:  Yes,  returning to group home At discharge, do you have transportation home?: Yes,  group home will pick her up at around noon Do you have the ability to pay for your medications: Yes,  BCBS   Release of information consent forms completed and submitted to medical records by CSW.  Patient to Follow up at: Follow-up Information    [facility] Follow up on 09/01/2017.   Why:  Hospital follow-up on Monday, 12/31 at 1:00PM. Thank you.  Contact information: [address] [place] [id number] [phone]           Next level of care provider [facility] Link:no  Safety Planning and Suicide Prevention discussed: Yes,  SPE completed with pt's guardian.   Have you used any form of tobacco in the last 30 days? (Cigarettes, Smokeless Tobacco, Cigars, and/or Pipes): No  Has patient been referred to the Quitline?: N/A patient is not a smoker  Patient has been referred for addiction treatment: Yes  [place]ul[place] HomesHeather N Smart, LCSW 08/28/2017, 9:09 AM

## 2017-08-28 NOTE — Progress Notes (Signed)
Pt attend wrap up group. Her day was 10. She had a shortness of breathe overall a good day. Her mood wasn't stable due to finding a placement for her once she's d/c. She dont want to go back to where she was. She talk to the social worker to help her to find a different location. There nothing came thru at this time.

## 2017-08-28 NOTE — BHH Suicide Risk Assessment (Addendum)
[facility]   Principal Problem: MDD (major depressive disorder), recurrent severe, without psychosis (HCC) Discharge Diagnoses:  Patient Active Problem List   Diagnosis Date Noted  . Autism spectrum disorder [F84.0] 05/17/2017  . MDD (major depressive disorder), recurrent severe, without psychosis (HCC) [F33.2] 05/16/2017    Total Time spent with patient: 30 minutes  Musculoskeletal: Strength & Muscle Tone: within normal limits Gait & Station: normal Patient leans: N/A  Psychiatric Specialty Exam: ROS mild headache, no chest pain, no shortness of breath, no vomiting , no fever, no chills   Blood pressure 122/66, pulse (!) 109, temperature 98.2 F (36.8 C), temperature source Oral, resp. rate 18, height 5\' 5"  (1.651 m), weight 67.6 kg (149 lb), SpO2 99 %.Body mass index is 24.79 kg/m.  General Appearance: Well Groomed  Eye Contact::  Good  Speech:  Normal Rate409  Volume:  Normal  Mood:  states she is feeling "OK", and presents euthymic, denies feeling depressed today  Affect:  Appropriate and appropriate  Thought Process:  Linear and Descriptions of Associations: Intact  Orientation:  Other:  fully alert and attentive  Thought Content:  denies hallucinations, no delusions, not internally preoccupied   Suicidal Thoughts:  No denies any suicidal or self injurious ideations, denies any homicidal or violent ideations, and specifically also denies any HI towards any staff or client at the Group Home she resides in  Homicidal Thoughts:  No  Memory:  recent and remote grossly intact   Judgement:  Other:  improving   Insight:  improving   Psychomotor Activity:  Normal  Concentration:  Good  Recall:  Good  Fund of Knowledge:Good  Language: Good  Akathisia:  Negative  Handed:  Right  AIMS (if indicated):   no abnormal or involuntary movements noted or reported   Assets:  Communication Skills Desire for Improvement Resilience  Sleep:  Number of Hours:  6.75  Cognition: WNL  ADL's:  Intact   Mental Status Per Nursing Assessment::   On Admission:  Suicidal ideation indicated by patient, Self-harm behaviors, Self-harm thoughts  Demographic Factors:  21 year old single female, no children, [place] in [place] setting.On disability.  Loss Factors: Chronic illness, disability, group home resident,reports feeling misunderstood by mother/group home staff at times   Historical Factors: History of depression, history of autism spectrum disorder. Lives in Group Home setting.  Risk Reduction Factors:   Living with another person, especially a relative, Positive social support and Positive coping skills or problem solving skills  Continued Clinical Symptoms:  At this time patient is alert, attentive well related, pleasant on approach, calm, no psychomotor agitation, describes mood as improved,presents euthymic, denies feeling depressed. Affect appropriate, reactive, bright , no thought disorder, no suicidal or self injurious ideations, no homicidal or violent ideations, no psychotic symptoms. Future oriented . Behavior on unit in good control, presents calm and pleasant on approach. Denies medication side effects. Side effects reviewed, including metabolic, motor side effects associated with Abilify and potential for increased SI on antidepressant medications in young adults .   Cognitive Features That Contribute To Risk:  No gross cognitive deficits noted upon discharge. Is alert , attentive, and oriented x 3   Suicide Risk:  Mild:  Suicidal ideation of limited frequency, intensity, duration, and specificity.  There are no identifiable plans, no associated intent, mild dysphoria and related symptoms, good self-control (both objective and subjective assessment), few other risk factors, and identifiable protective factors, including available and accessible social  support.  Follow-up Information    [facility]  Follow up on 09/01/2017.   Why:  Hospital follow-up on Monday, 12/31 at 1:00PM. Thank you.  Contact information: [address] [place] [address] [phone]           Plan Of Care/Follow-up recommendations:  Activity:  as tolerated Diet:  heart healthy Tests:  NA Other:  See below  Patient expresses readiness for discharge and is leaving unit in good spirits  Plans to return to her [facility]- [facility]  Plans to follow up as above She has an established PCP at [facility] - Dr. Levy PupaLauder   Sreshta Cressler A Dylin Breeden, MD 08/28/2017, 11:37 AM

## 2017-09-01 DIAGNOSIS — J00 Acute nasopharyngitis [common cold]: Secondary | ICD-10-CM | POA: Diagnosis not present

## 2017-09-01 DIAGNOSIS — R05 Cough: Secondary | ICD-10-CM | POA: Diagnosis not present

## 2017-09-01 DIAGNOSIS — F329 Major depressive disorder, single episode, unspecified: Secondary | ICD-10-CM | POA: Diagnosis not present

## 2017-09-08 DIAGNOSIS — Z79899 Other long term (current) drug therapy: Secondary | ICD-10-CM | POA: Diagnosis not present

## 2017-09-08 DIAGNOSIS — R45851 Suicidal ideations: Secondary | ICD-10-CM | POA: Diagnosis not present

## 2017-09-08 DIAGNOSIS — Z046 Encounter for general psychiatric examination, requested by authority: Secondary | ICD-10-CM | POA: Diagnosis not present

## 2017-09-08 DIAGNOSIS — F489 Nonpsychotic mental disorder, unspecified: Secondary | ICD-10-CM | POA: Diagnosis not present

## 2017-09-08 DIAGNOSIS — Z7289 Other problems related to lifestyle: Secondary | ICD-10-CM | POA: Diagnosis not present

## 2017-09-08 DIAGNOSIS — F329 Major depressive disorder, single episode, unspecified: Secondary | ICD-10-CM | POA: Diagnosis not present

## 2017-09-08 DIAGNOSIS — F840 Autistic disorder: Secondary | ICD-10-CM | POA: Diagnosis not present

## 2017-09-09 ENCOUNTER — Inpatient Hospital Stay (HOSPITAL_COMMUNITY)
Admission: AD | Admit: 2017-09-09 | Discharge: 2017-09-16 | DRG: 885 | Disposition: A | Discharge status: Home / Self Care | Payer: BLUE CROSS/BLUE SHIELD | Source: Other Acute Inpatient Hospital | Attending: Psychiatry | Admitting: Psychiatry

## 2017-09-09 ENCOUNTER — Other Ambulatory Visit: Payer: Self-pay

## 2017-09-09 ENCOUNTER — Encounter (HOSPITAL_COMMUNITY): Payer: Self-pay

## 2017-09-09 DIAGNOSIS — F322 Major depressive disorder, single episode, severe without psychotic features: Secondary | ICD-10-CM | POA: Diagnosis not present

## 2017-09-09 DIAGNOSIS — Z79899 Other long term (current) drug therapy: Secondary | ICD-10-CM | POA: Diagnosis not present

## 2017-09-09 DIAGNOSIS — F39 Unspecified mood [affective] disorder: Secondary | ICD-10-CM | POA: Diagnosis not present

## 2017-09-09 DIAGNOSIS — Z818 Family history of other mental and behavioral disorders: Secondary | ICD-10-CM | POA: Diagnosis not present

## 2017-09-09 DIAGNOSIS — K219 Gastro-esophageal reflux disease without esophagitis: Secondary | ICD-10-CM | POA: Diagnosis present

## 2017-09-09 DIAGNOSIS — F840 Autistic disorder: Secondary | ICD-10-CM | POA: Diagnosis not present

## 2017-09-09 DIAGNOSIS — F332 Major depressive disorder, recurrent severe without psychotic features: Secondary | ICD-10-CM | POA: Diagnosis not present

## 2017-09-09 DIAGNOSIS — R45851 Suicidal ideations: Secondary | ICD-10-CM | POA: Diagnosis not present

## 2017-09-09 DIAGNOSIS — Z598 Other problems related to housing and economic circumstances: Secondary | ICD-10-CM | POA: Diagnosis not present

## 2017-09-09 DIAGNOSIS — F489 Nonpsychotic mental disorder, unspecified: Secondary | ICD-10-CM | POA: Diagnosis not present

## 2017-09-09 DIAGNOSIS — F419 Anxiety disorder, unspecified: Secondary | ICD-10-CM | POA: Diagnosis not present

## 2017-09-09 DIAGNOSIS — G4700 Insomnia, unspecified: Secondary | ICD-10-CM | POA: Diagnosis not present

## 2017-09-09 DIAGNOSIS — I10 Essential (primary) hypertension: Secondary | ICD-10-CM | POA: Diagnosis present

## 2017-09-09 DIAGNOSIS — Z046 Encounter for general psychiatric examination, requested by authority: Secondary | ICD-10-CM | POA: Diagnosis not present

## 2017-09-09 MED ORDER — MAGNESIUM HYDROXIDE 400 MG/5ML PO SUSP: 30.0000 mL | Freq: Every day | ORAL | Status: DC | PRN

## 2017-09-09 MED ORDER — GABAPENTIN 100 MG PO CAPS
200.0000 mg | ORAL_CAPSULE | Freq: Three times a day (TID) | ORAL | Status: DC
  Administered 2017-09-10: 200 mg via ORAL
  Filled 2017-09-09 (×4): qty 2

## 2017-09-09 MED ORDER — VENLAFAXINE HCL ER 75 MG PO CP24
75.0000 mg | ORAL_CAPSULE | Freq: Every day | ORAL | Status: DC
  Administered 2017-09-10: 75 mg via ORAL
  Filled 2017-09-09 (×2): qty 1

## 2017-09-09 MED ORDER — VERAPAMIL HCL ER 240 MG PO TBCR
240.0000 mg | EXTENDED_RELEASE_TABLET | Freq: Every day | ORAL | Status: DC
  Administered 2017-09-09 – 2017-09-15 (×7): 240 mg via ORAL
  Filled 2017-09-09 (×2): qty 1
  Filled 2017-09-09: qty 2
  Filled 2017-09-09: qty 1
  Filled 2017-09-09: qty 2
  Filled 2017-09-09 (×6): qty 1

## 2017-09-09 MED ORDER — LOSARTAN POTASSIUM 25 MG PO TABS
25.0000 mg | ORAL_TABLET | Freq: Every day | ORAL | Status: DC
  Administered 2017-09-09 – 2017-09-16 (×8): 25 mg via ORAL
  Filled 2017-09-09 (×11): qty 1

## 2017-09-09 MED ORDER — TRAZODONE HCL 100 MG PO TABS
100.0000 mg | ORAL_TABLET | Freq: Every evening | ORAL | Status: DC | PRN
  Administered 2017-09-09 – 2017-09-15 (×7): 100 mg via ORAL
  Filled 2017-09-09 (×5): qty 1

## 2017-09-09 MED ORDER — PANTOPRAZOLE SODIUM 40 MG PO TBEC
40.0000 mg | DELAYED_RELEASE_TABLET | Freq: Every day | ORAL | Status: DC
  Administered 2017-09-09 – 2017-09-16 (×8): 40 mg via ORAL
  Filled 2017-09-09 (×11): qty 1

## 2017-09-09 MED ORDER — ARIPIPRAZOLE 10 MG PO TABS
20.0000 mg | ORAL_TABLET | Freq: Every day | ORAL | Status: DC
  Administered 2017-09-09 – 2017-09-16 (×8): 20 mg via ORAL
  Filled 2017-09-09 (×11): qty 2

## 2017-09-09 MED ORDER — HYDROXYZINE HCL 25 MG PO TABS
25.0000 mg | ORAL_TABLET | Freq: Three times a day (TID) | ORAL | Status: DC | PRN
  Administered 2017-09-10 – 2017-09-15 (×8): 25 mg via ORAL
  Filled 2017-09-09 (×6): qty 1

## 2017-09-09 MED ORDER — ACETAMINOPHEN 325 MG PO TABS
650.0000 mg | ORAL_TABLET | Freq: Four times a day (QID) | ORAL | Status: DC | PRN
  Administered 2017-09-09 – 2017-09-10 (×2): 650 mg via ORAL
  Filled 2017-09-09 (×2): qty 2

## 2017-09-09 MED ORDER — ALBUTEROL SULFATE HFA 108 (90 BASE) MCG/ACT IN AERS
2.0000 | INHALATION_SPRAY | Freq: Four times a day (QID) | RESPIRATORY_TRACT | Status: DC | PRN
  Administered 2017-09-09 – 2017-09-15 (×6): 2 via RESPIRATORY_TRACT
  Filled 2017-09-09: qty 6.7

## 2017-09-09 MED ORDER — ALUM & MAG HYDROXIDE-SIMETH 200-200-20 MG/5ML PO SUSP: 30.0000 mL | ORAL | Status: DC | PRN

## 2017-09-09 MED ORDER — FLUTICASONE PROPIONATE 50 MCG/ACT NA SUSP
1.0000 | Freq: Every day | NASAL | Status: DC
  Administered 2017-09-09 – 2017-09-16 (×8): 1 via NASAL
  Filled 2017-09-09 (×2): qty 16

## 2017-09-09 MED ORDER — HYDROCHLOROTHIAZIDE 12.5 MG PO CAPS
12.5000 mg | ORAL_CAPSULE | Freq: Every day | ORAL | Status: DC
  Administered 2017-09-09 – 2017-09-16 (×8): 12.5 mg via ORAL
  Filled 2017-09-09 (×11): qty 1

## 2017-09-09 NOTE — Progress Notes (Signed)
Adult Psychoeducational Group Note  Date:  09/09/2017 Time:  10:39 PM  Group Topic/Focus:  Wrap-Up Group:   The focus of this group is to help patients review their daily goal of treatment and discuss progress on daily workbooks.  Participation Level:  Active  Participation Quality:  Appropriate  Affect:  Appropriate  Cognitive:  Appropriate  Insight: Appropriate  Engagement in Group:  Engaged  Modes of Intervention:  Discussion  Additional Comments:  Patient attended group and said her day was a 1. Patient said she had just arrived on the unit.   Tri Chittick W Delcie Ruppert 09/09/2017, 10:39 PM

## 2017-09-09 NOTE — Tx Team (Signed)
Initial Treatment Plan 09/09/2017 9:55 PM Brittany Palmer WUJ:[id number]    PATIENT STRESSORS: Marital or family conflict Other: Living in group home   PATIENT STRENGTHS: Communication skills Physical Health   PATIENT IDENTIFIED PROBLEMS: Depression  Suicidal ideation  Anxiety   "Not feel so anxious"  "Help me overcome depression"             DISCHARGE CRITERIA:  Improved stabilization in mood, thinking, and/or behavior Verbal commitment to aftercare and medication compliance  PRELIMINARY DISCHARGE PLAN: Outpatient therapy Medication management  PATIENT/FAMILY INVOLVEMENT: This treatment plan has been presented to and reviewed with the patient, Brittany Palmer.  The patient and family have been given the opportunity to ask questions and make suggestions.  Levin BaconHeather V Goldie Dimmer, RN 09/09/2017, 9:55 PM

## 2017-09-09 NOTE — BH Assessment (Signed)
Assessment Note  Brittany Palmer is an 22 y.o. female who was brought to [facility] by local law enforcement under IVC paperwork due to "verbalizing suicide ideations with plans to strangle herself and cut herself".  Pt state "I been having  suicidal thoughts for 3-4 days and my plan was to strangle myself."  Pt reports cutting herself on the stomach with a bobby pin.  Pt states "the suicidal thoughts started 3-4 days ago when I thought I was going to get my cell phone back but my dorm mom didn't give it back to me."  Pt reports living at [facility] for 6 years.  Pt states her "cell phone was taken away Dec 2018 because (she) was on social media being friends with people (she) shouldn't have been friends with."    Pt report being hospitalized for suicidal ideation in Dec 2018.  Pt denies history of aggressive behaviors or violence.  Pt denies history of substance abuse.  Pt denies A/V hallucination. Pt did not appear to be responding to internal stimuli.  Pt was not able to contract for safety.    LPCA discussed case with [facility] provider, Donell Sievert, PA who recommends observation for safety and stability with re-evaluation by psychiatry in the AM.  LPCA informed pt's ER provider, Dr. Phylis Bougie and pt's nurse, Morrie Sheldon of the PA's recommended disposition.  Diagnosis: F33.2   Major depressive disorder, Recurrent episode, Severe   Past Medical History:  Past Medical History:  Diagnosis Date  . Autism   . GERD (gastroesophageal reflux disease)   . Hypertension     Past Surgical History:  Procedure Laterality Date  . TONSILLECTOMY      Family History: No family history on file.  Social History:  reports that  has never smoked. she has never used smokeless tobacco. She reports that she does not drink alcohol or use drugs.  Additional Social History:  Alcohol / Drug Use Pain Medications: see MAR Prescriptions: see MAR Over the Counter: see MAR History of alcohol /  drug use?: No history of alcohol / drug abuse  CIWA:   COWS:    Allergies:  Allergies  Allergen Reactions  . Lisinopril Cough  . Risperdal [Risperidone] Other (See Comments)    Twitching of mouth     Home Medications:  No medications prior to admission.    OB/GYN Status:  No LMP recorded.  General Assessment Data TTS Assessment: Out of system Is this a Tele or Face-to-Face Assessment?: Tele Assessment Is this an Initial Assessment or a Re-assessment for this encounter?: Initial Assessment Marital status: Single Maiden name: (never married) Is patient pregnant?: No Pregnancy Status: No Living Arrangements: Group Home Can pt return to current living arrangement?: Yes Admission Status: Involuntary Is patient capable of signing voluntary admission?: Yes Referral Source: (IVC) Insurance type: Probation officer [employer]     Crisis Care Plan Living Arrangements: Group Home [facility]  Name of Therapist: none report  Education Status Is patient currently in school?: No Highest grade of school patient has completed: HS diploma  Risk to self with the past 6 months Suicidal Ideation: Yes-Currently Present Has patient been a risk to self within the past 6 months prior to admission? : Yes Suicidal Intent: No-Not Currently/Within Last 6 Months Has patient had any suicidal intent within the past 6 months prior to admission? : Yes Is patient at risk for suicide?: Yes Suicidal Plan?: Yes-Currently Present(strangle herself or cut herself) Has patient had  any suicidal plan within the past 6 months prior to admission? : Yes Specify Current Suicidal Plan: strangle or cut herself Access to Means: No What has been your use of drugs/alcohol within the last 12 months?: none Previous Attempts/Gestures: (unknown) How many times?: (unknown) Other Self Harm Risks: none noted Triggers for Past Attempts: Other (Comment)(property taken away, not returned (cell  phone)) Intentional Self Injurious Behavior: (scratching self) Comment - Self Injurious Behavior: pt scratching abdomen Family Suicide History: Unknown Recent stressful life event(s): Other (Comment)(cell phone taken for being on social media ) Persecutory voices/beliefs?: No Depression: Yes Depression Symptoms: Feeling angry/irritable, Feeling worthless/self pity Substance abuse history and/or treatment for substance abuse?: No Suicide prevention information given to non-admitted patients: Not applicable  Risk to Others within the past 6 months Homicidal Ideation: No Does patient have any lifetime risk of violence toward others beyond the six months prior to admission? : Yes (comment) Thoughts of Harm to Others: No Current Homicidal Intent: No Current Homicidal Plan: No Describe Current Homicidal Plan: n/a Access to Homicidal Means: No Identified Victim: n/a History of harm to others?: No Assessment of Violence: In past 6-12 months Violent Behavior Description: has pushed other residents  Does patient have access to weapons?: No Criminal Charges Pending?: No Does patient have a court date: No Is patient on probation?: No  Psychosis Hallucinations: None noted Delusions: None noted  Mental Status Report Appearance/Hygiene: Unremarkable Affect: Appropriate to circumstance Anxiety Level: None Thought Processes: Thought Blocking(suicidal thoughts with a plan) Judgement: Impaired(suicidal thoughts with a plan) Orientation: Person, Place, Time, Situation Obsessive Compulsive Thoughts/Behaviors: Unable to Assess  Cognitive Functioning Concentration: Good Memory: Recent Intact, Remote Intact IQ: Average Insight: Fair Impulse Control: Poor Appetite: Fair Vegetative Symptoms: None  ADLScreening [facility](BHH Assessment Services) Patient's cognitive ability adequate to safely complete daily activities?: Yes Patient able to express need for assistance with ADLs?: Yes Independently  performs ADLs?: Yes (appropriate for developmental age)  Prior Inpatient Therapy Prior Inpatient Therapy: Yes Prior Therapy Dates: Novmeber 2018 Prior Therapy Facilty/Provider(s): [facility] Reason for Treatment: MDD  Prior Outpatient Therapy Prior Outpatient Therapy: Yes Prior Therapy Dates: ongoing [facility] Reason for Treatment: depression Does patient have an ACCT team?: No Does patient have Intensive In-House Services?  : No Does patient have [facility] services? : No Does patient have P4CC services?: No  ADL Screening (condition at time of admission) Patient's cognitive ability adequate to safely complete daily activities?: Yes Is the patient deaf or have difficulty hearing?: No Does the patient have difficulty seeing, even when wearing glasses/contacts?: No Does the patient have difficulty concentrating, remembering, or making decisions?: No Patient able to express need for assistance with ADLs?: Yes Does the patient have difficulty dressing or bathing?: No Independently performs ADLs?: Yes (appropriate for developmental age) Does the patient have difficulty walking or climbing stairs?: No Weakness of Legs: None Weakness of Arms/Hands: None       Abuse/Neglect Assessment (Assessment to be complete while patient is alone) Abuse/Neglect Assessment Can Be Completed: Yes Physical Abuse: Denies Verbal Abuse: Denies Sexual Abuse: Denies Exploitation of patient/patient's resources: Denies Self-Neglect: Denies     Merchant navy officerAdvance Directives (For Healthcare) Does Patient Have a Medical Advance Directive?: No Would patient like information on creating a medical advance directive?: No - Patient declined    Additional Information 1:1 In Past 12 Months?: No CIRT Risk: No Elopement Risk: No Does patient have medical clearance?: Yes     Disposition:  Disposition Initial Assessment Completed for this Encounter:  Yes Disposition of Patient:  Inpatient treatment program Type of inpatient treatment program: Adult  On Site Evaluation by:   Reviewed with Physician:    Dian Situ 09/09/2017 5:59 PM

## 2017-09-09 NOTE — Progress Notes (Signed)
Brittany Palmer is a 22 year old female being [facility].  She came to the ED under IVC for SI with plans to strangle or cut herself.  She did cut on herself using a bobby pin on her abdomen.  She reported her stressor as the home mom from the group home wouldn't return her phone because she was on social media talking to people that she shouldn't have been talking with.   She denied any HI or A/V hallucinations.  She has medical history of Autism, GERD and Hypertension.  She is diagnosed with Major depressive disorder.  [facility], she continues to voice suicidal ideation but she is able to contract for safety on the unit.  She denies HI or A/V hallucinations.  Oriented her to the unit.  Admission paperwork completed and signed.  Belongings searched and secured in locker # 28.  Skin assessment completed and noted multiple superficial cuts to abdomen.  Q 15 minute checks initiated for safety.  We will monitor the progress towards her goals.

## 2017-09-09 NOTE — Progress Notes (Signed)
Patient ID: [name], female   DOB: Jul 24, 1996, 22 y.o.   MRN: [id number]   Report accepted from admitting nurse Herbert SetaHeather, RN. Pt currently presents with a flat affect and cooperative behavior. Pt supported emotionally and encouraged to express concerns and questions. Pt's safety ensured with 15 minute and environmental checks. Pt endorses SI, passive. Pt currently denies HI and A/V hallucinations. Pt verbally agrees to seek staff if SI worsens, HI or A/VH occurs and to consult with staff before acting on any harmful thoughts. Will continue POC.

## 2017-09-10 DIAGNOSIS — F322 Major depressive disorder, single episode, severe without psychotic features: Secondary | ICD-10-CM

## 2017-09-10 DIAGNOSIS — F419 Anxiety disorder, unspecified: Secondary | ICD-10-CM

## 2017-09-10 DIAGNOSIS — F840 Autistic disorder: Secondary | ICD-10-CM

## 2017-09-10 DIAGNOSIS — Z818 Family history of other mental and behavioral disorders: Secondary | ICD-10-CM

## 2017-09-10 LAB — PREGNANCY, URINE: PREG TEST UR: NEGATIVE

## 2017-09-10 MED ORDER — IBUPROFEN 600 MG PO TABS
ORAL_TABLET | ORAL | Status: AC
  Filled 2017-09-10: qty 1

## 2017-09-10 MED ORDER — GABAPENTIN 100 MG PO CAPS
100.0000 mg | ORAL_CAPSULE | Freq: Three times a day (TID) | ORAL | Status: DC
  Administered 2017-09-10 – 2017-09-12 (×7): 100 mg via ORAL
  Filled 2017-09-10 (×9): qty 1

## 2017-09-10 MED ORDER — ESCITALOPRAM OXALATE 5 MG PO TABS
5.0000 mg | ORAL_TABLET | Freq: Every day | ORAL | Status: DC
  Administered 2017-09-11 – 2017-09-13 (×3): 5 mg via ORAL
  Filled 2017-09-10 (×4): qty 1

## 2017-09-10 MED ORDER — VENLAFAXINE HCL ER 37.5 MG PO CP24
37.5000 mg | ORAL_CAPSULE | Freq: Every day | ORAL | Status: DC
  Administered 2017-09-11 – 2017-09-13 (×3): 37.5 mg via ORAL
  Filled 2017-09-10 (×4): qty 1

## 2017-09-10 MED ORDER — IBUPROFEN 600 MG PO TABS
600.0000 mg | ORAL_TABLET | Freq: Four times a day (QID) | ORAL | Status: DC | PRN
  Administered 2017-09-10: 600 mg via ORAL

## 2017-09-10 NOTE — BHH Suicide Risk Assessment (Signed)
[facility]   Nursing information obtained from:  Patient Demographic factors:  Caucasian, Low socioeconomic status, Unemployed Current Mental Status:  Suicidal ideation indicated by patient, Self-harm behaviors, Self-harm thoughts Loss Factors:  Decrease in vocational status Historical Factors:  Prior suicide attempts, Impulsivity Risk Reduction Factors:  Positive therapeutic relationship  Total Time spent with patient: 45 minutes Principal Problem:  MDD  Diagnosis:   Patient Active Problem List   Diagnosis Date Noted  . MDD (major depressive disorder), severe (HCC) [F32.2] 09/09/2017  . Autism spectrum disorder [F84.0] 05/17/2017  . MDD (major depressive disorder), recurrent severe, without psychosis (HCC) [F33.2] 05/16/2017    Continued Clinical Symptoms:  Alcohol Use Disorder Identification Test Final Score (AUDIT): 0 The "Alcohol Use Disorders Identification Test", Guidelines for Use in Primary Care, Second Edition.  World Science writerHealth Organization [place](WHO). Score between 0-7:  no or low risk or alcohol related problems. Score between 8-15:  moderate risk of alcohol related problems. Score between 16-19:  high risk of alcohol related problems. Score 20 or above:  warrants further diagnostic evaluation for alcohol dependence and treatment.   CLINICAL FACTORS:  22 year old single female, group home resident, admitted due to SI and self cutting, history of prior psychiatric admissions, history of Autism Spectrum Disorder    Psychiatric Specialty Exam: Physical Exam  ROS  Blood pressure 119/81, pulse 88, temperature 98 F (36.7 C), resp. rate 16, height 5\' 5"  (1.651 m), weight 73.9 kg (163 lb), SpO2 98 %.Body mass index is 27.12 kg/m.   see admit note MSE   COGNITIVE FEATURES THAT CONTRIBUTE TO RISK:  Closed-mindedness and Loss of executive function    SUICIDE RISK:   Moderate:  Frequent suicidal ideation with limited intensity, and duration, some  specificity in terms of plans, no associated intent, good self-control, limited dysphoria/symptomatology, some risk factors present, and identifiable protective factors, including available and accessible social support.  PLAN OF CARE: Patient will be admitted to inpatient psychiatric unit for stabilization and safety. Will provide and encourage milieu participation. Provide medication management and maked adjustments as needed.  Will follow daily.    I certify that inpatient services furnished can reasonably be expected to improve the patient's condition.   Craige CottaFernando A Cobos, MD 09/10/2017, 11:19 AM

## 2017-09-10 NOTE — Tx Team (Signed)
Interdisciplinary Treatment and Diagnostic Plan Update  09/10/2017 Time of Session: [place] MRN: [id number]  Principal Diagnosis: <principal problem not specified>  Secondary Diagnoses: Active Problems:   MDD (major depressive disorder), severe (HCC)   Current Medications:  Current Facility-Administered Medications  Medication Dose Route Frequency Provider Last Rate Last Dose  . acetaminophen (TYLENOL) tablet 650 mg  650 mg Oral Q6H PRN Rankin, Shuvon B, NP   650 mg at 09/10/17 0743  . albuterol (PROVENTIL HFA;VENTOLIN HFA) 108 (90 Base) MCG/ACT inhaler 2 puff  2 puff Inhalation Q6H PRN Rankin, Shuvon B, NP   2 puff at 09/09/17 2012  . alum & mag hydroxide-simeth (MAALOX/MYLANTA) 200-200-20 MG/5ML suspension 30 mL  30 mL Oral Q4H PRN Rankin, Shuvon B, NP      . ARIPiprazole (ABILIFY) tablet 20 mg  20 mg Oral Daily Rankin, Shuvon B, NP   20 mg at 09/10/17 0738  . [START ON 09/11/2017] escitalopram (LEXAPRO) tablet 5 mg  5 mg Oral Daily Cobos, Fernando A, MD      . fluticasone (FLONASE) 50 MCG/ACT nasal spray 1 spray  1 spray Each Nare Daily Rankin, Shuvon B, NP   1 spray at 09/10/17 0738  . gabapentin (NEURONTIN) capsule 100 mg  100 mg Oral TID Cobos, Myer Peer, MD      . hydrochlorothiazide (MICROZIDE) capsule 12.5 mg  12.5 mg Oral Daily Rankin, Shuvon B, NP   12.5 mg at 09/10/17 0739  . hydrOXYzine (ATARAX/VISTARIL) tablet 25 mg  25 mg Oral TID PRN Rankin, Shuvon B, NP   25 mg at 09/10/17 0743  . ibuprofen (ADVIL,MOTRIN) tablet 600 mg  600 mg Oral QID PRN Laverle Hobby, PA-C   600 mg at 09/10/17 0507  . losartan (COZAAR) tablet 25 mg  25 mg Oral Daily Rankin, Shuvon B, NP   25 mg at 09/10/17 0739  . magnesium hydroxide (MILK OF MAGNESIA) suspension 30 mL  30 mL Oral Daily PRN Rankin, Shuvon B, NP      . pantoprazole (PROTONIX) EC tablet 40 mg  40 mg Oral Daily Rankin, Shuvon B, NP   40 mg at 09/10/17 0739  . traZODone (DESYREL) tablet 100 mg  100 mg Oral QHS PRN Rankin, Shuvon  B, NP   100 mg at 09/09/17 2143  . [START ON 09/11/2017] venlafaxine XR (EFFEXOR-XR) 24 hr capsule 37.5 mg  37.5 mg Oral Q breakfast Cobos, Fernando A, MD      . verapamil (CALAN-SR) CR tablet 240 mg  240 mg Oral QHS Rankin, Shuvon B, NP   240 mg at 09/09/17 2143   PTA Medications: Medications Prior to Admission  Medication Sig Dispense Refill Last Dose  . albuterol (PROVENTIL HFA;VENTOLIN HFA) 108 (90 Base) MCG/ACT inhaler Inhale 2 puffs into the lungs every 6 (six) hours as needed for wheezing or shortness of breath.    Past Month at Unknown time  . ARIPiprazole (ABILIFY) 20 MG tablet Take 1 tablet (20 mg total) by mouth daily. For mood stability 30 tablet 0   . fluticasone (FLONASE) 50 MCG/ACT nasal spray Place 1 spray into both nostrils daily.   08/19/2017 at Unknown time  . gabapentin (NEURONTIN) 100 MG capsule Take 2 capsules (200 mg total) by mouth 3 (three) times daily. 180 capsule 0   . hydrochlorothiazide (MICROZIDE) 12.5 MG capsule Take 12.5 mg by mouth daily.   08/19/2017 at Unknown time  . hydrOXYzine (ATARAX/VISTARIL) 25 MG tablet Take 1 tablet (25 mg total) by mouth 3 (three)  times daily as needed for anxiety. 30 tablet 0   . losartan (COZAAR) 25 MG tablet Take 25 mg by mouth daily.   08/19/2017 at Unknown time  . pantoprazole (PROTONIX) 40 MG tablet Take 1 tablet (40 mg total) by mouth daily. 30 tablet 0 08/19/2017 at Unknown time  . traZODone (DESYREL) 100 MG tablet Take 1 tablet (100 mg total) by mouth at bedtime as needed for sleep. 30 tablet 0   . venlafaxine XR (EFFEXOR-XR) 75 MG 24 hr capsule Take 1 capsule (75 mg total) by mouth daily with breakfast. For mood stability 30 capsule 0   . verapamil (CALAN-SR) 240 MG CR tablet Take 240 mg by mouth at bedtime.   08/18/2017 at Unknown time    Patient Stressors: Marital or family conflict Other: Living in group home  Patient Strengths: Armed forces logistics/support/administrative officer Physical Health  Treatment Modalities: Medication Management, Group  therapy, Case management,  1 to 1 session with clinician, Psychoeducation, Recreational therapy.   Physician Treatment Plan for Primary Diagnosis: <principal problem not specified> Long Term Goal(s):     Short Term Goals:    Medication Management: Evaluate patient's response, side effects, and tolerance of medication regimen.  Therapeutic Interventions: 1 to 1 sessions, Unit Group sessions and Medication administration.  Evaluation of Outcomes: Not Met  Physician Treatment Plan for Secondary Diagnosis: Active Problems:   MDD (major depressive disorder), severe ([place])  Long Term Goal(s):     Short Term Goals:       Medication Management: Evaluate patient's response, side effects, and tolerance of medication regimen.  Therapeutic Interventions: 1 to 1 sessions, Unit Group sessions and Medication administration.  Evaluation of Outcomes: Not Met   RN Treatment Plan for Primary Diagnosis: <principal problem not specified> Long Term Goal(s): Knowledge of disease and therapeutic regimen to maintain health will improve  Short Term Goals: Ability to identify and develop effective coping behaviors will improve and Compliance with prescribed medications will improve  Medication Management: RN will administer medications as ordered by provider, will assess and evaluate patient's response and provide education to patient for prescribed medication. RN will report any adverse and/or side effects to prescribing provider.  Therapeutic Interventions: 1 on 1 counseling sessions, Psychoeducation, Medication administration, Evaluate responses to treatment, Monitor vital signs and CBGs as ordered, Perform/monitor CIWA, COWS, AIMS and Fall Risk screenings as ordered, Perform wound care treatments as ordered.  Evaluation of Outcomes: Not Met   LCSW Treatment Plan for Primary Diagnosis: <principal problem not specified> Long Term Goal(s): Safe transition to appropriate next level of care at  discharge, Engage patient in therapeutic group addressing interpersonal concerns.  Short Term Goals: Engage patient in aftercare planning with referrals and resources, Increase social support and Increase skills for wellness and recovery  Therapeutic Interventions: Assess for all discharge needs, 1 to 1 time with Social worker, Explore available resources and support systems, Assess for adequacy in community support network, Educate family and significant other(s) on suicide prevention, Complete Psychosocial Assessment, Interpersonal group therapy.  Evaluation of Outcomes: Not Met   Progress in Treatment: Attending groups: No. Participating in groups: No. Taking medication as prescribed: Yes. Toleration medication: Yes. Family/Significant other contact made: Yes, individual(s) contacted:  mother/legal guardian Patient understands diagnosis: Yes. Discussing patient identified problems/goals with staff: Yes. Medical problems stabilized or resolved: Yes. Denies suicidal/homicidal ideation: Yes. Issues/concerns per patient self-inventory: No. Other: none  New problem(s) identified: No, Describe:  none  New Short Term/Long Term Goal(s):  Discharge Plan or Barriers:   Reason  for Continuation of Hospitalization: Medication stabilization  Estimated Length of Stay: 2-4 days.  Attendees: Patient: 09/10/2017  Physician: Dr. Parke Poisson, MD 09/10/2017   Nursing: Grayland Ormond, RN 09/10/2017   RN Care Manager: 09/10/2017   Social Worker: Lurline Idol, LCSW 09/10/2017   Recreational Therapist:  09/10/2017   Other:  09/10/2017   Other:  09/10/2017   Other: 09/10/2017        Scribe for Treatment Team: Joanne Chars, [employer] 09/10/2017 11:08 AM

## 2017-09-10 NOTE — Progress Notes (Signed)
Recreation Therapy Notes  Date: 09/10/17 Time: 0930 Location: [address] Dayroom  Group Topic: Stress Management  Goal Area(s) Addresses:  Patient will verbalize importance of using healthy stress management.  Patient will identify positive emotions associated with healthy stress management.   Intervention: Stress Management  Activity : Guided Imagery.  LRT introduced the stress management technique of guided imagery.  LRT read a script that allowed patients to visualize themselves along the beach.  Patients were to listen and follow along as script was read to engage in the activity.  Education: Stress Management, Discharge Planning.   Education Outcome: Acknowledges edcuation/In group clarification offered/Needs additional education  Clinical Observations/Feedback: Pt did not attend group.   Caroll RancherMarjette Song Myre, LRT/CTRS         Lillia AbedLindsay, Caryssa Elzey A 09/10/2017 12:00 PM

## 2017-09-10 NOTE — Plan of Care (Signed)
Nurse discussed depression, anxiety, coping skills with patient.  

## 2017-09-10 NOTE — [facility] Suicide Risk Assessment (Signed)
[facility] INPATIENT:  Family/Significant Other Suicide Prevention Education  Suicide Prevention Education:  Education Completed;  Althea CharonDana Galindez, mother/legal guardian, [phone], has been identified by the patient as the family member/significant other with whom the patient will be residing, and identified as the person(s) who will aid the patient in the event of a mental health crisis (suicidal ideations/suicide attempt).  With written consent from the patient, the family member/significant other has been provided the following suicide prevention education, prior to the and/or following the discharge of the patient.  The suicide prevention education provided includes the following:  Suicide risk factors  Suicide prevention and interventions  National Suicide Hotline telephone number  [facility] assessment telephone number  [facility] 911  [facility] and/or [facility] telephone number  Request made of family/significant other to:  Remove weapons (e.g., guns, rifles, knives), all items previously/currently identified as safety concern.    Remove drugs/medications (over-the-counter, prescriptions, illicit drugs), all items previously/currently identified as a safety concern.  The family member/significant other verbalizes understanding of the suicide prevention education information provided.  The family member/significant other agrees to remove the items of safety concern listed above.  Annabelle HarmanDana was aware of the admission.  Pt upset about not getting her smart phone.  Staff discovered during last admission that she is communicating with men online and feel this is a safety issues.  Pt still trying to get kicked out of the program.  Annabelle HarmanDana still believes this is best option for pt.  Annabelle HarmanDana agreed to engage pt in discussion about living here and possibly include the program director in hopes that pt will accept this.  Pt took a packed bag to  her [facility] appt and Annabelle HarmanDana believes she fully intended to say she was suicidal in order to get herself readmitted.  Lorri FrederickWierda, Corrisa Gibby Jon, LCSW 09/10/2017, 10:13 AM

## 2017-09-10 NOTE — [facility] Group Notes (Signed)
[facility] Group Therapy 09/10/2017 1:15pm  Type of Therapy: [facility] [employer] Presentation  Participation Level: Active  Participation Quality: Attentive  Affect: Appropriate  Cognitive: Oriented  Insight: Developing/Improving  Engagement in Therapy: Engaged  Modes of Intervention: Discussion, Education and Socialization  Summary of Progress/Problems: [employer] (M[facility]) Speaker came to talk about his personal journey with mental health. The pt processed ways by which to relate to the speaker. MHA speaker provided handouts and educational information pertaining to groups and services offered by the MHA. Pt was engaged in speaker's presentation and was receptive to resources provided.    Cheyrl Buley N Smart, LCSW 09/10/2017 3:42 PM  

## 2017-09-10 NOTE — Progress Notes (Signed)
Patient's UA put in lab refrigerator for pick up by lab tonight  D:  Patient's self inventory sheet, patient has fair sleep, sleep medication helpful.  Fair appetite, normal energy level, good concentration.  Rated depression and hopeless 9, anxiety 10.  Denied withdrawals.  SI on self inventory form, contracts for safety.  Physical problems, headaches, worst headache in past 24 hours is #7.  Pain medication not helpful.  Goal is "stay positive, socialize".  No discharge plans. A:  Medications administered per MD orders.  Emotional support and encouragement given patient. R:  Patient denied SI and HI, contracts for safety.  Denied A/V hallucinations.  Safety maintained with 15 minute checks. Patient denied SI while talking to nurse today, contracts for safety.

## 2017-09-10 NOTE — Progress Notes (Signed)
Patient ID: Brittany Palmer, female   DOB: [birth year], 22 y.o.   MRN: [id number]  Pt currently presents with a flat affect and anxious behavior. Pt reports to Clinical research associatewriter that she had a good day today. Pt reports that her headache from yesterday lessened overtime. Pt seen in the dayroom at one point dancing to music on the television. Pt mood is stable. Pt reports intermittent sleep with current medication regimen.   Pt provided with medications per providers orders. Pt's labs and vitals were monitored throughout the night. Pt given a 1:1 about emotional and mental status. Pt supported and encouraged to express concerns and questions. Pt educated on medications and assertiveness tehcniques.  Pt's safety ensured with 15 minute and environmental checks. Pt currently denies SI/HI and A/V hallucinations. In response to SI, pt states "not right now." Plan to hang herself when she does have SI. Pt verbally agrees to seek staff if SI/HI or A/VH occurs and to consult with staff before acting on any harmful thoughts. Will continue POC.

## 2017-09-10 NOTE — Progress Notes (Signed)
Patient's EKG completed and given to MD for review. Patient given UA container for specimen.

## 2017-09-10 NOTE — Progress Notes (Signed)
Pt attend wrap up group. Her day was a 5. Her goal was to stay positive.

## 2017-09-10 NOTE — H&P (Signed)
Psychiatric Admission Assessment Adult  Patient Identification: Brittany Palmer MRN:  [id number] Date of Evaluation:  09/10/2017 Chief Complaint:  " I have been cutting myself " Principal Diagnosis: MDD Diagnosis:   Patient Active Problem List   Diagnosis Date Noted  . MDD (major depressive disorder), severe (HCC) [F32.2] 09/09/2017  . Autism spectrum disorder [F84.0] 05/17/2017  . MDD (major depressive disorder), recurrent severe, without psychosis (HCC) [F33.2] 05/16/2017   History of Present Illness: 22 year old single female, group home resident, known to our unit from prior admissions, most recently from 12/18- 08/28/2017. At that time she was admitted due to making threats towards staff and peers at her Group Home, and making superficial scratches on her skin. She has a history of Autistic Spectrum Disorder, and has also been diagnosed with MDD. At discharge she was prescribed Abilify, Neurontin and Effexor  Presented to ED on commitment , which reported she had expressed thoughts of strangling herself and cutting herself . She states she has cut herself superficially , mainly on abdomen, several times recently, which she states is a way of coping with negative affects . She states she has been upset and angry related to issues at her Group Home [facility]). In particular, states that her cell phone was taken from her, which she thinks was either a punishment for having made threats or because staff there is concerned about her social media interactions . Writer is familiar with patient's case from prior psychiatric admissions : group home related stressors have been ongoing . She describes other clients mainly as children or teenagers, and she states she is the oldest client there, and reports a sense of frustration regarding being an adult and conforming to to Group Home rules and regulations .   Associated Signs/Symptoms: Depression Symptoms:  depressed  mood, anhedonia, insomnia, suicidal thoughts with specific plan, anxiety, erratic appetite (Hypo) Manic Symptoms:  No clear symptoms of mania , does states she has been feeling irritable at times  Anxiety Symptoms:  Describes anxiety as related to stressors above  Psychotic Symptoms:  Denies  PTSD Symptoms: Does not endorse  Total Time spent with patient: 45 minutes  Past Psychiatric History: history of Autistic Spectrum Disorder, has been living in Group Home setting for years. History of depression, history of self inflicted injurious behaviors, such as self cutting . Reports brief mood swings but no clear history of mania . Denies history of psychosis.    Is the patient at risk to self? Yes.    Has the patient been a risk to self in the past 6 months? Yes.    Has the patient been a risk to self within the distant past? No.  Is the patient a risk to others? No.  Has the patient been a risk to others in the past 6 months? Yes.    Has the patient been a risk to others within the distant past? No.   Prior Inpatient Therapy: Prior Inpatient Therapy: Yes Prior Therapy Dates: Novmeber 2018 Prior Therapy Facilty/Provider(s): [facility] Reason for Treatment: MDD Prior Outpatient Therapy: Prior Outpatient Therapy: Yes Prior Therapy Dates: ongoing Prior Therapy [facility]Provider(s): [facility] Reason for Treatment: depression Does patient have an ACCT team?: No Does patient have Intensive In-House Services?  : No Does patient have [facility] services? : No Does patient have P4CC services?: No  Alcohol Screening: 1. How often do you have a drink containing alcohol?: Never 2. How many drinks containing alcohol do you have on a typical  day when you are drinking?: 1 or 2 3. How often do you have six or more drinks on one occasion?: Never AUDIT-C Score: 0 9. Have you or someone else been injured as a result of your drinking?: No 10. Has a relative or friend or a doctor or another health worker  been concerned about your drinking or suggested you cut down?: No Alcohol Use Disorder Identification Test Final Score (AUDIT): 0 Intervention/Follow-up: AUDIT Score <7 follow-up not indicated Substance Abuse History in the last 12 months:  No substance or alcohol abuse  Consequences of Substance Abuse: -  Previous Psychotropic Medications: has been on Abilify for several years, also on Neurontin x several months, but currently states she is unsure if it is helping . Has been on Effexor XR trial for several months . Psychological Evaluations: - Past Medical History:  Past Medical History:  Diagnosis Date  . Autism   . GERD (gastroesophageal reflux disease)   . Hypertension     Past Surgical History:  Procedure Laterality Date  . TONSILLECTOMY     Family History: non contributory- patient's closest support network is mother.  Family Psychiatric  History: sister committed suicide  Tobacco Screening: Have you used any form of tobacco in the last 30 days? (Cigarettes, Smokeless Tobacco, Cigars, and/or Pipes): No Social History: single, no children, Group Home resident, unemployed  Social History   Substance and Sexual Activity  Alcohol Use No     Social History   Substance and Sexual Activity  Drug Use No    Additional Social History: Marital status: Single    Pain Medications: see MAR Prescriptions: see MAR Over the Counter: see MAR History of alcohol / drug use?: No history of alcohol / drug abuse  Allergies:   Allergies  Allergen Reactions  . Lisinopril Cough  . Risperdal [Risperidone] Other (See Comments)    Twitching of mouth    Lab Results: No results found for this or any previous visit (from the past 48 hour(s)).  Blood Alcohol level:  No results found for: [facility]  Metabolic Disorder Labs:  Lab Results  Component Value Date   HGBA1C 5.8 (H) 04/12/2011   MPG 120 (H) 04/12/2011   Lab Results  Component Value Date   PROLACTIN 30.2 04/12/2011   Lab Results   Component Value Date   CHOL 115 04/12/2011   TRIG 58 04/12/2011   HDL 47 04/12/2011   CHOLHDL 2.4 04/12/2011   VLDL 12 04/12/2011   LDLCALC 56 04/12/2011    Current Medications: Current Facility-Administered Medications  Medication Dose Route Frequency Provider Last Rate Last Dose  . acetaminophen (TYLENOL) tablet 650 mg  650 mg Oral Q6H PRN Rankin, Shuvon B, NP   650 mg at 09/10/17 0743  . albuterol (PROVENTIL HFA;VENTOLIN HFA) 108 (90 Base) MCG/ACT inhaler 2 puff  2 puff Inhalation Q6H PRN Rankin, Shuvon B, NP   2 puff at 09/09/17 2012  . alum & mag hydroxide-simeth (MAALOX/MYLANTA) 200-200-20 MG/5ML suspension 30 mL  30 mL Oral Q4H PRN Rankin, Shuvon B, NP      . ARIPiprazole (ABILIFY) tablet 20 mg  20 mg Oral Daily Rankin, Shuvon B, NP   20 mg at 09/10/17 0738  . [START ON 09/11/2017] escitalopram (LEXAPRO) tablet 5 mg  5 mg Oral Daily Jeriann Sayres A, MD      . fluticasone (FLONASE) 50 MCG/ACT nasal spray 1 spray  1 spray Each Nare Daily Rankin, Shuvon B, NP   1 spray at 09/10/17 0738  .  gabapentin (NEURONTIN) capsule 100 mg  100 mg Oral TID Ayomide Purdy, Rockey Situ, MD      . hydrochlorothiazide (MICROZIDE) capsule 12.5 mg  12.5 mg Oral Daily Rankin, Shuvon B, NP   12.5 mg at 09/10/17 0739  . hydrOXYzine (ATARAX/VISTARIL) tablet 25 mg  25 mg Oral TID PRN Rankin, Shuvon B, NP   25 mg at 09/10/17 0743  . ibuprofen (ADVIL,MOTRIN) tablet 600 mg  600 mg Oral QID PRN Kerry Hough, PA-C   600 mg at 09/10/17 0507  . losartan (COZAAR) tablet 25 mg  25 mg Oral Daily Rankin, Shuvon B, NP   25 mg at 09/10/17 0739  . magnesium hydroxide (MILK OF MAGNESIA) suspension 30 mL  30 mL Oral Daily PRN Rankin, Shuvon B, NP      . pantoprazole (PROTONIX) EC tablet 40 mg  40 mg Oral Daily Rankin, Shuvon B, NP   40 mg at 09/10/17 0739  . traZODone (DESYREL) tablet 100 mg  100 mg Oral QHS PRN Rankin, Shuvon B, NP   100 mg at 09/09/17 2143  . [START ON 09/11/2017] venlafaxine XR (EFFEXOR-XR) 24 hr capsule 37.5  mg  37.5 mg Oral Q breakfast Emalie Mcwethy A, MD      . verapamil (CALAN-SR) CR tablet 240 mg  240 mg Oral QHS Rankin, Shuvon B, NP   240 mg at 09/09/17 2143   PTA Medications: Medications Prior to Admission  Medication Sig Dispense Refill Last Dose  . albuterol (PROVENTIL HFA;VENTOLIN HFA) 108 (90 Base) MCG/ACT inhaler Inhale 2 puffs into the lungs every 6 (six) hours as needed for wheezing or shortness of breath.    Past Month at Unknown time  . ARIPiprazole (ABILIFY) 20 MG tablet Take 1 tablet (20 mg total) by mouth daily. For mood stability 30 tablet 0   . fluticasone (FLONASE) 50 MCG/ACT nasal spray Place 1 spray into both nostrils daily.   08/19/2017 at Unknown time  . gabapentin (NEURONTIN) 100 MG capsule Take 2 capsules (200 mg total) by mouth 3 (three) times daily. 180 capsule 0   . hydrochlorothiazide (MICROZIDE) 12.5 MG capsule Take 12.5 mg by mouth daily.   08/19/2017 at Unknown time  . hydrOXYzine (ATARAX/VISTARIL) 25 MG tablet Take 1 tablet (25 mg total) by mouth 3 (three) times daily as needed for anxiety. 30 tablet 0   . losartan (COZAAR) 25 MG tablet Take 25 mg by mouth daily.   08/19/2017 at Unknown time  . pantoprazole (PROTONIX) 40 MG tablet Take 1 tablet (40 mg total) by mouth daily. 30 tablet 0 08/19/2017 at Unknown time  . traZODone (DESYREL) 100 MG tablet Take 1 tablet (100 mg total) by mouth at bedtime as needed for sleep. 30 tablet 0   . venlafaxine XR (EFFEXOR-XR) 75 MG 24 hr capsule Take 1 capsule (75 mg total) by mouth daily with breakfast. For mood stability 30 capsule 0   . verapamil (CALAN-SR) 240 MG CR tablet Take 240 mg by mouth at bedtime.   08/18/2017 at Unknown time    Musculoskeletal: Strength & Muscle Tone: within normal limits Gait & Station: normal Patient leans: N/A  Psychiatric Specialty Exam: Physical Exam  Review of Systems  Constitutional: Negative.   HENT: Negative.   Eyes: Negative.   Respiratory: Negative.   Cardiovascular: Negative.    Gastrointestinal: Positive for vomiting.       Reported vomited x 1 yesterday   Genitourinary: Negative.   Musculoskeletal: Negative.   Skin: Negative.   Neurological: Negative for seizures.  Endo/Heme/Allergies: Negative.   Psychiatric/Behavioral: Positive for depression and suicidal ideas.  All other systems reviewed and are negative.   Blood pressure 119/81, pulse 88, temperature 98 F (36.7 C), resp. rate 16, height 5\' 5"  (1.651 m), weight 73.9 kg (163 lb), SpO2 98 %.Body mass index is 27.12 kg/m.  General Appearance: Fairly Groomed  Eye Contact:  Good  Speech:  Normal Rate  Volume:  Normal  Mood:  reports feeling better than prior to admission  Affect:  reactive, smiles briefly at times, anxious   Thought Process:  Linear and Descriptions of Associations: Intact  Orientation:  Other:  fully alert and attentive   Thought Content:  no hallucinations, no delusions, not internally preoccupied   Suicidal Thoughts:  No at this time denies suicidal or self injurious ideations, and is able to contract for safety on unit. Also , is future oriented , and is focusing on potential disposition planning, states she would like to consider trying to live independently after dishcarge  Homicidal Thoughts:  No- denies any homicidal ideations  Memory:  recent and remote grossly intact   Judgement:  Fair  Insight:  Fair  Psychomotor Activity:  Normal  Concentration:  Concentration: Good and Attention Span: Good  Recall:  Good  Fund of Knowledge:  Good  Language:  Good  Akathisia:  Negative  Handed:  Right  AIMS (if indicated):     Assets:  Communication Skills Desire for Improvement Resilience  ADL's:  Intact  Cognition:  WNL  Sleep:  Number of Hours: 6.75    Treatment Plan Summary: Daily contact with patient to assess and evaluate symptoms and progress in treatment, Medication management, Plan inpatient admission and medications as below  Observation Level/Precautions:  15 minute  checks  Laboratory:  as needed   Psychotherapy: milieu, group therapy     Medications:  We discussed medication options- Patient states she has been on Abilify for several years, without side effects. Regarding Effexor states she has been on this medication for several weeks to months- of note she feels that recent episodes of making threats, acting out, are out of character for her, and that they occurred after starting Effexor . Of note, patient also has history of HTN ( elevated BP may be associated to Venlafaxine) . Regarding Neurontin, patient states she does not feel it is working and would rather D/C  Based on above considerations  Initiate Effexor XR taper- decrease to 37.5 mgrs QDAY, taper gradually to minimize risk of WDL. Start Lexapro 5 mgrs QDAY initially for depression Continue Abilify 20 mgrs QDAY for mood and antidepressant augmentation Taper Neurontin down to 100 mgrs TID, consider tapering off     Consultations:  As needed   Discharge Concerns:  Patient states she is not wanting to return to Group Home at [facility]  Estimated LOS: 5 days   Other:     Physician Treatment Plan for Primary Diagnosis:  MDD- no psychotic features   Long Term Goal(s): Improvement in symptoms so as ready for discharge  Short Term Goals: Ability to identify changes in lifestyle to reduce recurrence of condition will improve and Ability to maintain clinical measurements within normal limits will improve  Physician Treatment Plan for Secondary Diagnosis: Suicidal Ideations, Self Inflicted Injurious Behaviors  Long Term Goal(s): Improvement in symptoms so as ready for discharge  Short Term Goals: Ability to verbalize feelings will improve, Ability to disclose and discuss suicidal ideas, Ability to demonstrate self-control will improve and Ability to identify and develop  effective coping behaviors will improve  I certify that inpatient services furnished can reasonably be expected to improve the  patient's condition.    Craige Cotta, MD 1/9/201910:49 AM

## 2017-09-10 NOTE — Progress Notes (Signed)
Patient ID: Brittany Palmer, female   DOB: [birth year], 22 y.o.   MRN: [id number]  Pt reports ongoing headache without much relief from as needed medication. Pt blood pressure WDL, BP 127/80, P 81. Pt also reports intermittent nausea with headaches. Pt encouraged to rest until time for breakfast. Will pass onto oncoming RN.

## 2017-09-10 NOTE — [facility] Counselor (Addendum)
Adult Comprehensive Assessment    Patient ID: Brittany Palmer, female   DOB: Jul 18, 1996, 22 y.o.   MRN: [id number]  Information Source:  Patient  Current Stressors:  Housing / Lack of housing: Pt lives in a "girls home" and reports she became suicidal due to be denied the use of her cell phone by program staff. Social relationships: Problems with some of the residents where she lives.  Living/Environment/Situation:  Living Arrangements: Group Home Living conditions (as described by patient or guardian): conflict with other residents How long has patient lived in current situation?: 6 years What is atmosphere in current home: Chaotic  Family History:  Marital status: Single Are you sexually active?: No What is your sexual orientation?: heterosexual Has your sexual activity been affected by drugs, alcohol, medication, or emotional stress?: na Does patient have children?: No  Childhood History:  By whom was/is the patient raised?: Both parents, Adoptive parents Additional childhood history information: Patient states that she was with her biological parents until the age of 89. Patient has been with biological parents since 89 y/o Description of patient's relationship with caregiver when they were a child: pretty good Patient's description of current relationship with people who raised him/her: good relationship with both parents How were you disciplined when you got in trouble as a child/adolescent?: spanking, removing priveleges Does patient have siblings?: Yes Number of Siblings: 7 Description of patient's current relationship with siblings: sister comiited suicide in March 2018, has 6 brothers. She has a good relationship with her brothers Did patient suffer any verbal/emotional/physical/sexual abuse as a child?: No Did patient suffer from severe childhood neglect?: Yes Patient description of severe childhood neglect: pt removed from bio parents due to drug use Has patient ever  been sexually abused/assaulted/raped as an adolescent or adult?: No Was the patient ever a victim of a crime or a disaster?: No Witnessed domestic violence?: No Has patient been effected by domestic violence as an adult?: No  Education:  Highest grade of school patient has completed: HS diploma Currently a Consulting civil engineerstudent?: No Learning disability?: No  Employment/Work Situation:   Employment situation: On disability Why is patient on disability: pt is unsure How long has patient been on disability: 3 years Patient's job has been impacted by current illness: (na) What is the longest time patient has a held a job?: Pt has not had employment Has patient ever been in the Eli Lilly and Companymilitary?: No Are There Guns or Other Weapons in Your Home?: No  Financial Resources:   Financial resources: Receives SSI Does patient have a Lawyerrepresentative payee or guardian?: Yes Name of representative payee or guardian: mother  Alcohol/Substance Abuse:   What has been your use of drugs/alcohol within the last 12 months?: Pt denies alcohol or drug use. If attempted suicide, did drugs/alcohol play a role in this?: No Alcohol/Substance Abuse Treatment Hx: Denies past history Has alcohol/substance abuse ever caused legal problems?: No  Social Support System:   Patient's Community Support System: Fair Museum/gallery exhibitions officerDescribe Community Support System: parents Type of faith/religion: baptist How does patient's faith help to cope with current illness?: "I don't really care for it."  Leisure/Recreation:   Leisure and Hobbies: Swinging on swings; listening to music  Strengths/Needs:   What things does the patient do well?: Pt could not identify anything. In what areas does patient struggle / problems for patient: Current living situation  Discharge Plan:   Does patient have access to transportation?: Yes Will patient be returning to same living situation after discharge?: Yes(pt would like to  live somewhere else) Currently  receiving community mental health services: Yes ([facility] [place]) [facility] was at [facility] appt the day of her current admission. Does patient have financial barriers related to discharge medications?: No        Summary/Recommendations:   Summary and Recommendations (to be completed by the evaluator): Pt is 22 year old female from [place]. [facility])  Pt is diagnosed with major depressive disorder and autism and was admitted due to suicidal ideation which pt reports was triggered by being denied the use of her cell phone.  Pt cell phone use has been restricted due to inappropriate online contact with older men that presents safety issues.  Recommendations for pt include crisis stabilization, therapeutic milieu, attend and participate in groups, medication management, and development of comprhensive mentall wellness plan.  Lorri Frederick. 09/10/2017

## 2017-09-11 DIAGNOSIS — F332 Major depressive disorder, recurrent severe without psychotic features: Principal | ICD-10-CM

## 2017-09-11 DIAGNOSIS — F39 Unspecified mood [affective] disorder: Secondary | ICD-10-CM

## 2017-09-11 LAB — LIPID PANEL
CHOL/HDL RATIO: 2.7 ratio
CHOLESTEROL: 117 mg/dL (ref 0–200)
HDL: 43 mg/dL (ref >40)
LDL Cholesterol: 53 mg/dL (ref 0–99)
TRIGLYCERIDES: 105 mg/dL (ref <150)
VLDL: 21 mg/dL (ref 0–40)

## 2017-09-11 LAB — HEMOGLOBIN A1C
Hgb A1c MFr Bld: 5 % (ref 4.8–5.6)
Mean Plasma Glucose: 96.8 mg/dL

## 2017-09-11 NOTE — Progress Notes (Signed)
Patient ID: Brittany Palmer, female   DOB: February 02, 1996, 22 y.o.   MRN: [id number]  Pt currently presents with a blunted affect and cooperative behavior. Pt reports to Clinical research associatewriter that she feels she is "not ready to leave." Reports she does not want to go back to the girls home as she has no "freedom" there. Pt reports that she has been making some healthy and not healthy decisions recently. Pt states [facility] Pt reports good sleep with current medication regimen.   Pt provided with medications per providers orders. Pt's labs and vitals were monitored throughout the night. Pt given a 1:1 about emotional and mental status. Pt supported and encouraged to express concerns and questions. Pt educated on medications and assertiveness techniques.   Pt's safety ensured with 15 minute and environmental checks. Pt endorses SI to hang herself. Pt currently denies HI and A/V hallucinations. Pt verbally agrees to seek staff if SI worsens, HI or A/VH occurs and to consult with staff before acting on any harmful thoughts. Pt spoke with her mother tonight about discharge plans and reports it made her feel more depressed because her mom said she had to stay in town and that she has even more bills she has to pay off now. Will continue POC.

## 2017-09-11 NOTE — Plan of Care (Signed)
Patient is compliant with prescribed medication regimen. 

## 2017-09-11 NOTE — Progress Notes (Signed)
DAR NOTE: Patient presents with calm affect and pleasant mood.  Denies pain, auditory and visual hallucinations.  Reports suicidal thoughts on self inventory form and verbally contracts for safety during assessment. Described energy level as normal and concentration good.  Rates depression at 2, hopelessness at 2, and anxiety at 5.  Maintained on routine safety checks.  Medications given as prescribed.  Support and encouragement offered as needed.  Attended group and participated.  States goal for today is "to smile."  Patient observed socializing with peers and dancing in the dayroom.  Offered no complaint.

## 2017-09-11 NOTE — Progress Notes (Signed)
[facility] Group Notes:  (Nursing/MHT/Case Management/Adjunct)  Date:  09/11/2017  Time: 0830 Type of Therapy:  Nurse Education  Participation Level:  Active  Participation Quality:  Appropriate  Affect:  Appropriate  Cognitive:  Alert and Oriented  Insight:  Appropriate  Engagement in Group:  Engaged  Modes of Intervention:  Activity, Discussion, Education, Socialization and Support  Summary of Progress/Problems:The purpose of this group is to educate and introduce patients to the benefits of aromatherapy. Pt participated and was engaged in group.   Beatrix ShipperWright, Ijanae Macapagal Martin 09/11/2017, 5:00 PM

## 2017-09-11 NOTE — [facility] Group Notes (Signed)
[facility] LCSW Group Therapy Note  Date/Time:  09/11/17, 1315  Type of Therapy/Topic:  Group Therapy:  Balance in Life  Participation Level:  minimal  Description of Group:    This group will address the concept of balance and how it feels and looks when one is unbalanced. Patients will be encouraged to process areas in their lives that are out of balance, and identify reasons for remaining unbalanced. Facilitators will guide patients utilizing problem- solving interventions to address and correct the stressor making their life unbalanced. Understanding and applying boundaries will be explored and addressed for obtaining  and maintaining a balanced life. Patients will be encouraged to explore ways to assertively make their unbalanced needs known to significant others in their lives, using other group members and facilitator for support and feedback.  Therapeutic Goals: 1. Patient will identify two or more emotions or situations they have that consume much of in their lives. 2. Patient will identify signs/triggers that life has become out of balance:  3. Patient will identify two ways to set boundaries in order to achieve balance in their lives:  4. Patient will demonstrate ability to communicate their needs through discussion and/or role plays  Summary of Patient Progress:Pt shared that family, physical and spiritual are areas that are out of balance in her life.  Pt was active in discussion regarding ways to address out of balance areas in a positive way.           Therapeutic Modalities:   Cognitive Behavioral Therapy Solution-Focused Therapy Assertiveness Training  Daleen SquibbGreg Ashlen Kiger, [place]CS[place]

## 2017-09-11 NOTE — Progress Notes (Signed)
[facility] MD Progress Note  09/11/2017 3:16 PM Brittany Palmer  MRN:  [id number]   Subjective:  Patient reports that she is doing good today. She wants the [facility] to help find her another facility to go to or discharge her to [facility]. She states that she does not want to go back to [facility]. She does acknowledge that she had her phone taken away and she took a bag with her to her scheduled [facility] appointment with the plan to tell her provider that she was suicidal so she could come to the hospital. She request that I speak to her mother, which is still her legal guardian, about getting her moved.  Objective: Patient's chart and findings reviewed and discussed with treatment team. The patient presents in the day room where she has been interacting appropriately and has been dancing to music in front of others. She does not appear depressed, anxious, or agitated. I contacted her mother and the mother states that Brittany Palmer will not be moved. The facility has provided her with income for doing work, takes her shopping with her own money. The phone was confiscated due to [employer] a female through [facility] and it was inappropriate. When Brittany Palmer was asked about this she admits that she feels she is controlled because she is 22 years old and wants to be able to go and do as she wants and have sex. Mom agreed that patient would be safe to return to [facility]. CSW notified to contact [facility] for return information.  Principal Problem: MDD (major depressive disorder), severe (HCC) Diagnosis:   Patient Active Problem List   Diagnosis Date Noted  . MDD (major depressive disorder), severe (HCC) [F32.2] 09/09/2017  . Autism spectrum disorder [F84.0] 05/17/2017  . MDD (major depressive disorder), recurrent severe, without psychosis (HCC) [F33.2] 05/16/2017   Total Time spent with patient: 15 minutes  Past Psychiatric History: See h&P  Past Medical History:  Past Medical History:  Diagnosis Date  . Autism   .  GERD (gastroesophageal reflux disease)   . Hypertension     Past Surgical History:  Procedure Laterality Date  . TONSILLECTOMY     Family History: History reviewed. No pertinent family history. Family Psychiatric  History: See H&P Social History:  Social History   Substance and Sexual Activity  Alcohol Use No     Social History   Substance and Sexual Activity  Drug Use No    Social History   Socioeconomic History  . Marital status: Single    Spouse name: None  . Number of children: None  . Years of education: None  . Highest education level: None  Social Needs  . Financial resource strain: None  . Food insecurity - worry: None  . Food insecurity - inability: None  . Transportation needs - medical: None  . Transportation needs - non-medical: None  Occupational History  . None  Tobacco Use  . Smoking status: Never Smoker  . Smokeless tobacco: Never Used  Substance and Sexual Activity  . Alcohol use: No  . Drug use: No  . Sexual activity: No  Other Topics Concern  . None  Social History Narrative  . None   Additional Social History:    Pain Medications: see MAR Prescriptions: see MAR Over the Counter: see MAR History of alcohol / drug use?: No history of alcohol / drug abuse                    Sleep: Good  Appetite:  Good  Current Medications: Current Facility-Administered Medications  Medication Dose Route Frequency Provider Last Rate Last Dose  . acetaminophen (TYLENOL) tablet 650 mg  650 mg Oral Q6H PRN Rankin, Shuvon B, NP   650 mg at 09/10/17 0743  . albuterol (PROVENTIL HFA;VENTOLIN HFA) 108 (90 Base) MCG/ACT inhaler 2 puff  2 puff Inhalation Q6H PRN Rankin, Shuvon B, NP   2 puff at 09/11/17 1109  . alum & mag hydroxide-simeth (MAALOX/MYLANTA) 200-200-20 MG/5ML suspension 30 mL  30 mL Oral Q4H PRN Rankin, Shuvon B, NP      . ARIPiprazole (ABILIFY) tablet 20 mg  20 mg Oral Daily Rankin, Shuvon B, NP   20 mg at 09/11/17 0806  . escitalopram  (LEXAPRO) tablet 5 mg  5 mg Oral Daily Angelise Petrich, Rockey SituFernando A, MD   5 mg at 09/11/17 0805  . fluticasone (FLONASE) 50 MCG/ACT nasal spray 1 spray  1 spray Each Nare Daily Rankin, Shuvon B, NP   1 spray at 09/11/17 0806  . gabapentin (NEURONTIN) capsule 100 mg  100 mg Oral TID Piya Mesch, Rockey SituFernando A, MD   100 mg at 09/11/17 1209  . hydrochlorothiazide (MICROZIDE) capsule 12.5 mg  12.5 mg Oral Daily Rankin, Shuvon B, NP   12.5 mg at 09/11/17 0806  . hydrOXYzine (ATARAX/VISTARIL) tablet 25 mg  25 mg Oral TID PRN Rankin, Shuvon B, NP   25 mg at 09/10/17 2246  . ibuprofen (ADVIL,MOTRIN) tablet 600 mg  600 mg Oral QID PRN Kerry HoughSimon, Spencer E, PA-C   600 mg at 09/10/17 0507  . losartan (COZAAR) tablet 25 mg  25 mg Oral Daily Rankin, Shuvon B, NP   25 mg at 09/11/17 0806  . magnesium hydroxide (MILK OF MAGNESIA) suspension 30 mL  30 mL Oral Daily PRN Rankin, Shuvon B, NP      . pantoprazole (PROTONIX) EC tablet 40 mg  40 mg Oral Daily Rankin, Shuvon B, NP   40 mg at 09/11/17 0806  . traZODone (DESYREL) tablet 100 mg  100 mg Oral QHS PRN Rankin, Shuvon B, NP   100 mg at 09/10/17 2246  . venlafaxine XR (EFFEXOR-XR) 24 hr capsule 37.5 mg  37.5 mg Oral Q breakfast Carinne Brandenburger, Rockey SituFernando A, MD   37.5 mg at 09/11/17 0806  . verapamil (CALAN-SR) CR tablet 240 mg  240 mg Oral QHS Rankin, Shuvon B, NP   240 mg at 09/10/17 2245    Lab Results:  Results for orders placed or performed during the hospital encounter of 09/09/17 (from the past 48 hour(s))  Pregnancy, urine     Status: None   Collection Time: 09/10/17  1:23 PM  Result Value Ref Range   Preg Test, Ur NEGATIVE NEGATIVE    Comment:        THE SENSITIVITY OF THIS METHODOLOGY IS >20 mIU/mL. Performed at [facility], [address] [place], [place] [address]   Hemoglobin A1c     Status: None   Collection Time: 09/11/17  7:28 AM  Result Value Ref Range   Hgb A1c MFr Bld 5.0 4.8 - 5.6 %    Comment: (NOTE) Pre diabetes:          5.7%-6.4% Diabetes:               >6.4% Glycemic control for   <7.0% adults with diabetes    Mean Plasma Glucose 96.8 mg/dL    Comment: Performed at [facility], [address] [place], [place] [address]  Lipid  panel     Status: None   Collection Time: 09/11/17  7:28 AM  Result Value Ref Range   Cholesterol 117 0 - 200 mg/dL   Triglycerides 161 <096 mg/dL   HDL 43 >04 mg/dL   Total CHOL/HDL Ratio 2.7 RATIO   VLDL 21 0 - 40 mg/dL   LDL Cholesterol 53 0 - 99 mg/dL    Comment:        Total Cholesterol/HDL:CHD Risk Coronary Heart Disease Risk Table                     Men   Women  1/2 Average Risk   3.4   3.3  Average Risk       5.0   4.4  2 X Average Risk   9.6   7.1  3 X Average Risk  23.4   11.0        Use the calculated Patient Ratio above and the CHD Risk Table to determine the patient's CHD Risk.        ATP III CLASSIFICATION (LDL):  <100     mg/dL   Optimal  540-981  mg/dL   Near or Above                    Optimal  130-159  mg/dL   Borderline  191-478  mg/dL   High  >295     mg/dL   Very High Performed at [facility], [address] [place], [place] [address]     Blood Alcohol level:  No results found for: [facility]  Metabolic Disorder Labs: Lab Results  Component Value Date   HGBA1C 5.0 09/11/2017   MPG 96.8 09/11/2017   MPG 120 (H) 04/12/2011   Lab Results  Component Value Date   PROLACTIN 30.2 04/12/2011   Lab Results  Component Value Date   CHOL 117 09/11/2017   TRIG 105 09/11/2017   HDL 43 09/11/2017   CHOLHDL 2.7 09/11/2017   VLDL 21 09/11/2017   LDLCALC 53 09/11/2017   LDLCALC 56 04/12/2011    Physical Findings: AIMS: Facial and Oral Movements Muscles of Facial Expression: None, normal Lips and Perioral Area: None, normal Jaw: None, normal Tongue: None, normal,Extremity Movements Upper (arms, wrists, hands, fingers): None, normal Lower (legs, knees, ankles, toes): None, normal, Trunk Movements Neck, shoulders, hips: None, normal,  Overall Severity Severity of abnormal movements (highest score from questions above): None, normal Incapacitation due to abnormal movements: None, normal Patient's awareness of abnormal movements (rate only patient's report): No Awareness, Dental Status Current problems with teeth and/or dentures?: No Does patient usually wear dentures?: No  CIWA:  CIWA-Ar Total: 1 COWS:  COWS Total Score: 2  Musculoskeletal: Strength & Muscle Tone: within normal limits Gait & Station: normal Patient leans: N/A  Psychiatric Specialty Exam: Physical Exam  Nursing note and vitals reviewed. Constitutional: She is oriented to person, place, and time. She appears well-developed and well-nourished.  Respiratory: Effort normal.  Musculoskeletal: Normal range of motion.  Neurological: She is alert and oriented to person, place, and time.  Skin: Skin is warm.    Review of Systems  Constitutional: Negative.   HENT: Negative.   Eyes: Negative.   Respiratory: Negative.   Cardiovascular: Negative.   Gastrointestinal: Negative.   Genitourinary: Negative.   Musculoskeletal: Negative.   Skin: Negative.   Neurological: Negative.   Endo/Heme/Allergies: Negative.   Psychiatric/Behavioral: Negative.     Blood pressure 124/75, pulse (!) 114, temperature  97.7 F (36.5 C), temperature source Oral, resp. rate 18, height 5\' 5"  (1.651 m), weight 73.9 kg (163 lb), SpO2 98 %.Body mass index is 27.12 kg/m.  General Appearance: Casual  Eye Contact:  Good  Speech:  Clear and Coherent and Normal Rate  Volume:  Increased  Mood:  Euthymic  Affect:  Flat  Thought Process:  Goal Directed and Descriptions of Associations: Intact  Orientation:  Full (Time, Place, and Person)  Thought Content:  WDL  Suicidal Thoughts:  No  Homicidal Thoughts:  No  Memory:  Immediate;   Good Recent;   Good Remote;   Good  Judgement:  Fair  Insight:  Fair  Psychomotor Activity:  Normal  Concentration:  Concentration: Good and  Attention Span: Good  Recall:  Good  Fund of Knowledge:  Fair  Language:  Good  Akathisia:  No  Handed:  Right  AIMS (if indicated):     Assets:  Communication Skills Desire for Improvement Financial Resources/Insurance Housing Physical Health Social Support Transportation  ADL's:  Intact  Cognition:  WNL  Sleep:  Number of Hours: 6.25   Problems Addressed: ASD MDD severe  Treatment Plan Summary: Daily contact with patient to assess and evaluate symptoms and progress in treatment, Medication management and Plan is to:  -Continue Abilify 20 mg PO Daily for mood stability -Continue Effexor-XR 37.5 mg PO Daily (Taper off tomorrow) -Continue Gabapentin 100 mg PO TID  -Continue Lexapro 5 mg PO Daily for mood stability (Increase to 10 mg before discharge) -Encourage group therapy particiaption   Maryfrances Bunnell, FNP 09/11/2017, 3:16 PM   Agree with Progress Note

## 2017-09-11 NOTE — Progress Notes (Signed)
Adult Psychoeducational Group Note  Date:  09/11/2017 Time:  11:00 AM  Group Topic/Focus:  Building Self Esteem:   The Focus of this group is helping patients become aware of the effects of self-esteem on their lives, the things they and others do that enhance or undermine their self-esteem, seeing the relationship between their level of self-esteem and the choices they make and learning ways to enhance self-esteem.  Participation Level:  Active  Participation Quality:  Appropriate  Affect:  Appropriate  Cognitive:  Alert and Appropriate  Insight: Appropriate, Good and Improving  Engagement in Group:  Engaged  Modes of Intervention:  Activity  Additional Comments:  Pt did participate in all group activities and discussions today.  Gabriellia Rempel R Alvetta Hidrogo 09/11/2017, 11:00 AM

## 2017-09-12 MED ORDER — GABAPENTIN 100 MG PO CAPS
100.0000 mg | ORAL_CAPSULE | Freq: Two times a day (BID) | ORAL | Status: DC
  Administered 2017-09-13: 100 mg via ORAL
  Filled 2017-09-12 (×3): qty 1

## 2017-09-12 NOTE — Progress Notes (Signed)
[facility] MD Progress Note  09/12/2017 11:42 AM Brittany Palmer  MRN:  [id number]   Subjective:  Patient reports feeling more anxious, depressed, which she states is related to current plan of returning to her [place]. States this causes her to feel suicidal and does not feel safe discharging, although does contract for safety on unit, and denies current plan of suicide or hurting self . Patient able to describe her concerns at more length today- states she feels that [facility] is too strict and Christian based, which she  states results in her feeling restricted, such as not being able to listen to certain music, not being able to socialize with people via social media. Denies medication side effects.  Objective: I have discussed case with treatment team and have met with patient. Patient presents alert, attentive, calm, more depressed and vaguely anxious today. As above, denies current suicidal ideations, contracts for safety on unit, but reports she does not feel safe for discharge and that returning to [facility] causes her to feel suicidal . As discussed with staff /CSW mother is patient's guardian, and has informed staff that [facility] is appropriate setting for patient and that the staff there have made significant accommodations for patient to be more comfortable and happy. Patient on the other hand, as above, expresses concern and reluctance to return to [facility] [place], which she describes as too restrictive.  This situation is chronic, and it is felt by Probation officer and team that it has been a contributor to psychiatric admissions. Labs reviewed as below- unremarkable    Principal Problem: MDD (major depressive disorder), severe ([place]) Diagnosis:   Patient Active Problem List   Diagnosis Date Noted  . MDD (major depressive disorder), severe ([place]) [F32.2] 09/09/2017  . Autism spectrum disorder [F84.0] 05/17/2017  . MDD (major depressive disorder), recurrent severe, without psychosis ([place]) [F33.2]  05/16/2017   Total Time spent with patient: 20 minutes  Past Psychiatric History: See h&P  Past Medical History:  Past Medical History:  Diagnosis Date  . Autism   . GERD (gastroesophageal reflux disease)   . Hypertension     Past Surgical History:  Procedure Laterality Date  . TONSILLECTOMY     Family History: History reviewed. No pertinent family history. Family Psychiatric  History: See H&P Social History:  Social History   Substance and Sexual Activity  Alcohol Use No     Social History   Substance and Sexual Activity  Drug Use No    Social History   Socioeconomic History  . Marital status: Single    Spouse name: None  . Number of children: None  . Years of education: None  . Highest education level: None  Social Needs  . Financial resource strain: None  . Food insecurity - worry: None  . Food insecurity - inability: None  . Transportation needs - medical: None  . Transportation needs - non-medical: None  Occupational History  . None  Tobacco Use  . Smoking status: Never Smoker  . Smokeless tobacco: Never Used  Substance and Sexual Activity  . Alcohol use: No  . Drug use: No  . Sexual activity: No  Other Topics Concern  . None  Social History Narrative  . None   Additional Social History:    Pain Medications: see MAR Prescriptions: see MAR Over the Counter: see MAR History of alcohol / drug use?: No history of alcohol / drug abuse  Sleep: Good  Appetite:  Good  Current Medications: Current Facility-Administered Medications  Medication Dose Route Frequency Provider Last Rate Last Dose  . acetaminophen (TYLENOL) tablet 650 mg  650 mg Oral Q6H PRN Rankin, Shuvon B, NP   650 mg at 09/10/17 0743  . albuterol (PROVENTIL HFA;VENTOLIN HFA) 108 (90 Base) MCG/ACT inhaler 2 puff  2 puff Inhalation Q6H PRN Rankin, Shuvon B, NP   2 puff at 09/11/17 1109  . alum & mag hydroxide-simeth (MAALOX/MYLANTA) 200-200-20 MG/5ML suspension 30 mL  30 mL Oral Q4H  PRN Rankin, Shuvon B, NP      . ARIPiprazole (ABILIFY) tablet 20 mg  20 mg Oral Daily Rankin, Shuvon B, NP   20 mg at 09/12/17 0849  . escitalopram (LEXAPRO) tablet 5 mg  5 mg Oral Daily Cobos, Myer Peer, MD   5 mg at 09/12/17 0848  . fluticasone (FLONASE) 50 MCG/ACT nasal spray 1 spray  1 spray Each Nare Daily Rankin, Shuvon B, NP   1 spray at 09/12/17 0848  . gabapentin (NEURONTIN) capsule 100 mg  100 mg Oral TID Cobos, Myer Peer, MD   100 mg at 09/12/17 0848  . hydrochlorothiazide (MICROZIDE) capsule 12.5 mg  12.5 mg Oral Daily Rankin, Shuvon B, NP   12.5 mg at 09/12/17 0848  . hydrOXYzine (ATARAX/VISTARIL) tablet 25 mg  25 mg Oral TID PRN Rankin, Shuvon B, NP   25 mg at 09/11/17 2250  . ibuprofen (ADVIL,MOTRIN) tablet 600 mg  600 mg Oral QID PRN Laverle Hobby, PA-C   600 mg at 09/10/17 0507  . losartan (COZAAR) tablet 25 mg  25 mg Oral Daily Rankin, Shuvon B, NP   25 mg at 09/12/17 0848  . magnesium hydroxide (MILK OF MAGNESIA) suspension 30 mL  30 mL Oral Daily PRN Rankin, Shuvon B, NP      . pantoprazole (PROTONIX) EC tablet 40 mg  40 mg Oral Daily Rankin, Shuvon B, NP   40 mg at 09/12/17 0848  . traZODone (DESYREL) tablet 100 mg  100 mg Oral QHS PRN Rankin, Shuvon B, NP   100 mg at 09/11/17 2250  . venlafaxine XR (EFFEXOR-XR) 24 hr capsule 37.5 mg  37.5 mg Oral Q breakfast Cobos, Myer Peer, MD   37.5 mg at 09/12/17 0848  . verapamil (CALAN-SR) CR tablet 240 mg  240 mg Oral QHS Rankin, Shuvon B, NP   240 mg at 09/11/17 2250    Lab Results:  Results for orders placed or performed during the hospital encounter of 09/09/17 (from the past 48 hour(s))  Pregnancy, urine     Status: None   Collection Time: 09/10/17  1:23 PM  Result Value Ref Range   Preg Test, Ur NEGATIVE NEGATIVE    Comment:        THE SENSITIVITY OF THIS METHODOLOGY IS >20 mIU/mL. Performed at [facility] [place] [address] [place], [place] [address]   Hemoglobin A1c     Status: None   Collection  Time: 09/11/17  7:28 AM  Result Value Ref Range   Hgb A1c MFr Bld 5.0 4.8 - 5.6 %    Comment: (NOTE) Pre diabetes:          5.7%-6.4% Diabetes:              >6.4% Glycemic control for   <7.0% adults with diabetes    Mean Plasma Glucose 96.8 mg/dL    Comment: Performed at [facility] [address] [place], [place] [address]  Lipid panel     Status: None   Collection  Time: 09/11/17  7:28 AM  Result Value Ref Range   Cholesterol 117 0 - 200 mg/dL   Triglycerides 105 <150 mg/dL   HDL 43 >40 mg/dL   Total CHOL/HDL Ratio 2.7 RATIO   VLDL 21 0 - 40 mg/dL   LDL Cholesterol 53 0 - 99 mg/dL    Comment:        Total Cholesterol/HDL:CHD Risk Coronary Heart Disease Risk Table                     Men   Women  1/2 Average Risk   3.4   3.3  Average Risk       5.0   4.4  2 X Average Risk   9.6   7.1  3 X Average Risk  23.4   11.0        Use the calculated Patient Ratio above and the CHD Risk Table to determine the patient's CHD Risk.        ATP III CLASSIFICATION (LDL):  <100     mg/dL   Optimal  100-129  mg/dL   Near or Above                    Optimal  130-159  mg/dL   Borderline  160-189  mg/dL   High  >190     mg/dL   Very High Performed at [facility] [address] [place], [place] [address]     Blood Alcohol level:  No results found for: [facility]  Metabolic Disorder Labs: Lab Results  Component Value Date   HGBA1C 5.0 09/11/2017   MPG 96.8 09/11/2017   MPG 120 (H) 04/12/2011   Lab Results  Component Value Date   PROLACTIN 30.2 04/12/2011   Lab Results  Component Value Date   CHOL 117 09/11/2017   TRIG 105 09/11/2017   HDL 43 09/11/2017   CHOLHDL 2.7 09/11/2017   VLDL 21 09/11/2017   LDLCALC 53 09/11/2017   LDLCALC 56 04/12/2011    Physical Findings: AIMS: Facial and Oral Movements Muscles of Facial Expression: None, normal Lips and Perioral Area: None, normal Jaw: None, normal Tongue: None, normal,Extremity Movements Upper  (arms, wrists, hands, fingers): None, normal Lower (legs, knees, ankles, toes): None, normal, Trunk Movements Neck, shoulders, hips: None, normal, Overall Severity Severity of abnormal movements (highest score from questions above): None, normal Incapacitation due to abnormal movements: None, normal Patient's awareness of abnormal movements (rate only patient's report): No Awareness, Dental Status Current problems with teeth and/or dentures?: No Does patient usually wear dentures?: No  CIWA:  CIWA-Ar Total: 1 COWS:  COWS Total Score: 2  Musculoskeletal: Strength & Muscle Tone: within normal limits Gait & Station: normal Patient leans: N/A  Psychiatric Specialty Exam: Physical Exam  Nursing note and vitals reviewed. Constitutional: She is oriented to person, place, and time. She appears well-developed and well-nourished.  Respiratory: Effort normal.  Musculoskeletal: Normal range of motion.  Neurological: She is alert and oriented to person, place, and time.  Skin: Skin is warm.    Review of Systems  Constitutional: Negative.   HENT: Negative.   Eyes: Negative.   Respiratory: Negative.   Cardiovascular: Negative.   Gastrointestinal: Negative.   Genitourinary: Negative.   Musculoskeletal: Negative.   Skin: Negative.   Neurological: Negative.   Endo/Heme/Allergies: Negative.   Psychiatric/Behavioral: Negative.   reports some nausea, no vomiting, no rash, no fever, no chills   Blood pressure 122/72, pulse 88, temperature  98.5 F (36.9 C), temperature source Oral, resp. rate 18, height _0  (1.651 m), weight 73.9 kg (163 lb), SpO2 98 %.Body mass index is 27.12 kg/m.  General Appearance: Well Groomed  Eye Contact:  Good  Speech:  Normal Rate  Volume:  Normal  Mood:  reports feeling depressed  Affect:  vaguely constricted, anxious  Thought Process:  Linear and Descriptions of Associations: Intact  Orientation:  Full (Time, Place, and Person)  Thought Content:  no  hallucinations, no delusions, not internally preoccupied   Suicidal Thoughts:  Yes.  without intent/plan- able to contract for safety on unit, but states that she would feel unsafe if discharged at this time, and reports she would feel suicidal at [place], but safe on unit at this time  Homicidal Thoughts:  No  Memory:  recent and remote grossly intact   Judgement:  Fair- improving   Insight:  Fair- improving   Psychomotor Activity:  Normal  Concentration:  Concentration: Good and Attention Span: Good  Recall:  Good  Fund of Knowledge:  Good  Language:  Good  Akathisia:  No  Handed:  Right  AIMS (if indicated):     Assets:  Communication Skills Desire for Improvement Financial Resources/Insurance Housing Physical Health Social Support Transportation  ADL's:  Intact  Cognition:  WNL  Sleep:  Number of Hours: 6.5   Assessment - patient reports that she is feeling more depressed and anxious today, pertaining to plan of returning to [place] where she resides . She states she does not feel safe to discharge and makes statement she would be suicidal if returned to [place] at this time. She is future oriented, and states that her major issue with Group Home is that she finds it too restrictive . As per staff, mother is legal guardian and has reported current living setting is appropriate.  Treatment Plan Summary: Daily contact with patient to assess and evaluate symptoms and progress in treatment, Medication management and Plan is to:  -Treatment team working on disposition planning as below -Continue Abilify 20 mg PO Daily for mood stability -Continue Effexor-XR 37.5 mg PO Daily - currently being tapered gradually to minimize risk of WDL -Decrease Neurontin to 100 mgrs BID - currently being tapered down  -Continue Lexapro 5 mg PO Daily for mood stability (Increase to 10 mg before discharge) -Encourage group therapy particiaption to work on Radiographer, therapeutic and symptom reduction -As  discussed with staff, patient , CSW :  having a meeting with mother, patient , CSW, Probation officer(  and if possible appropriate  [employer] ) would be indicated to discuss above circumstances and decide upon best course of action/disposition plan. CSW to contact mother to try to arrange .    Jenne Campus, MD 09/12/2017, 11:42 AMPatient ID: Brittany Palmer, female   DOB: 02/05/1996, 22 y.o.   MRN: [id number]

## 2017-09-12 NOTE — Plan of Care (Signed)
Patient is safe and free from injury.  Routine safety checks maintained Q 15 minutes. 

## 2017-09-12 NOTE — Progress Notes (Signed)
Adult Psychoeducational Group Note  Date:  09/12/2017 Time:  8:38 PM  Group Topic/Focus:  Wrap-Up Group:   The focus of this group is to help patients review their daily goal of treatment and discuss progress on daily workbooks.  Participation Level:  Active  Participation Quality:  Appropriate  Affect:  Appropriate  Cognitive:  Appropriate  Insight: Appropriate  Engagement in Group:  Engaged  Modes of Intervention:  Discussion  Additional Comments: The patient expressed that she attended groups.  Octavio Mannshigpen, Vidyuth Belsito Lee 09/12/2017, 8:38 PM

## 2017-09-12 NOTE — Progress Notes (Signed)
Recreation Therapy Notes  Date:  09/12/17 Time: 0930 Location: [address] Group Room  Group Topic: Stress Management  Goal Area(s) Addresses:  Patient will verbalize importance of using healthy stress management.  Patient will identify positive emotions associated with healthy stress management.  Intervention: Stress Management  Activity :  Progressive Muscle Relaxation.  LRT introduced the stress management technique of progressive muscle relaxation.  LRT read a script to guide patients through the technique which allowed them to tense and relax each muscle group individually.  Education:  Stress Management, Discharge Planning.   Education Outcome: Acknowledges edcuation/In group clarification offered/Needs additional education  Clinical Observations/Feedback: Pt did not attend group.     Jasilyn Holderman, LRT/CTRS         Jennine Peddy A 09/12/2017 11:05 AM 

## 2017-09-12 NOTE — [facility] Group Notes (Signed)
  [facility]  Date/Time: 09/12/17, 1315  Type of Therapy/Topic:  Group Therapy:  Emotion Regulation  Participation Level:  Active   Mood:pleasant  Description of Group:    The purpose of this group is to assist patients in learning to regulate negative emotions and experience positive emotions. Patients will be guided to discuss ways in which they have been vulnerable to their negative emotions. These vulnerabilities will be juxtaposed with experiences of positive emotions or situations, and patients challenged to use positive emotions to combat negative ones. Special emphasis will be placed on coping with negative emotions in conflict situations, and patients will process healthy conflict resolution skills.  Therapeutic Goals: 1. Patient will identify two positive emotions or experiences to reflect on in order to balance out negative emotions:  2. Patient will label two or more emotions that they find the most difficult to experience:  3. Patient will be able to demonstrate positive conflict resolution skills through discussion or role plays:   Summary of Patient Progress:Pt identified anger as an emotion that is difficult for her to experience.  Pt was active in group discussion regarding positive ways to handle negative emotions.       Therapeutic Modalities:   Cognitive Behavioral Therapy Feelings Identification Dialectical Behavioral Therapy  Daleen SquibbGreg Vansh Reckart, LCSW

## 2017-09-12 NOTE — Progress Notes (Signed)
CSW spoke to legal guardian/mother Brittany Palmer and informed her of plan to discharge pt today.  Brittany HarmanDana spoke with pt last night and they discussed pt desire to live somewhere else--pt upset that she is being sent back to her current situation. Mother did tell pt that frequent hospitalizations is not the way to convince her that she is ready for more independence.  Brittany Palmer, MSW, LCSW Clinical Social Worker 09/12/2017 8:36 AM

## 2017-09-12 NOTE — Progress Notes (Signed)
  DATA ACTION RESPONSE  Objective- Pt. is visible in the dayroom, seen pacing.  Presents with an anxious/depressed affect and mood. Pt states she is worried about placement. She states that she does not want to go back to same group home. Pt states she doesn't feel supported by mom.  Subjective- Denies having any HI/AVH/Pain at this time. Endorses passive SI but verbal contracts for safety.  Is cooperative and remains safe on the unit.  1:1 interaction in private to establish rapport. Encouragement, education, & support given from staff.  PRN vistaril, albuterol, trazodone requested and will re-eval accordingly.   Safety maintained with Q 15 checks. Continue with POC.

## 2017-09-12 NOTE — Progress Notes (Signed)
DAR NOTE: Patient presents with anxious affect and depressed mood.  Denies pain, auditory and visual hallucinations.  Reports suicidal thoughts on self inventory form but verbally contract for safety.  Rates depression at 10, hopelessness at 10, and anxiety at 10.  Maintained on routine safety checks.  Medications given as prescribed.  Support and encouragement offered as needed.  Attended group and participated.  States goal for today is "find someone to advocate for me."  Patient voiced concern about returning to group home.  States she doesn't feel safe to return to group home.  Patient observed socializing with peers in the dayroom.

## 2017-09-13 MED ORDER — ESCITALOPRAM OXALATE 10 MG PO TABS
10.0000 mg | ORAL_TABLET | Freq: Every day | ORAL | Status: DC
  Administered 2017-09-14 – 2017-09-16 (×3): 10 mg via ORAL
  Filled 2017-09-13 (×6): qty 1

## 2017-09-13 NOTE — [facility] Group Notes (Signed)
[facility] LCSW Group Therapy Note  Date/Time:    09/13/2017   10:15 - 11:15 AM  Type of Therapy and Topic:  Group Therapy:  Thought Distortions  Participation Level:  Active   Description of Group:  In this group, patients were introduced to cognitive distortions and discussed how these can negatively affect lives.  This included All or Nothing thinking, Labeling, Focusing on the Negative, the Shoulds, Blaming, Predicting the Future, Overgeneralization, Mind Reading, Catastrophizing, Emotional Reasoning, Personalization, and Jumping to Conclusions.  Patients then used a worksheet to describe an upsetting event in their life and the Automatic Thoughts they had about that event.  They identified the type of cognitive distortion they used and worked with the group to come up with a more realistic thought to refute their original Automatic Thought.  Therapeutic Goals: 1. Patient will be able to identify this exercise of examining their thoughts as a healthy coping mechanism. 2. Patient will identify a problem area in their life and the automatic thoughts they have about that situation. 3. Patient will verbalize the feelings and outcomes typically associated with their thoughts. 4. Patient will think about and discuss the available evidence supporting their thoughts, as well as assumptions they have to make to believe it. 5. Patient will identify evidence or facts that refute their cognitive distortion. 6. Patient will verbalize a more realistic, helpful conclusion.  Summary of Patient Progress:  The patient expressed that last time she was upset was when her phone was taken away and not returned, and she also talked about her mother not agreeing to have her placed elsewhere.  She participated actively in the discussion and in the worksheet activity, was insightful with her responses into her own situation.   Therapeutic Modalities Cognitive Behavioral Therapy Dialectical Behavioral Therapy  Ambrose MantleMareida  Grossman-Orr, LCSW 09/13/2017 12:00PM

## 2017-09-13 NOTE — Progress Notes (Signed)
[facility] MD Progress Note  09/13/2017 8:20 AM Keasia Dubose  MRN:  [id number]   Subjective:  States she continues to feel depressed and also reports anxiety. She focuses on her current social situation, and states her hope is to " get an advocate so I can change from my mother being my guardian to state guardianship". She states she feels frustrated that her mother "does not want to support any changes" and wants her to return to the residential setting she lives in . States " my mother is trying to make me someone I am not ". Denies medication side effects.  Objective: I have reviewed chart notes and have met with patient. Patient presents with vaguely depressed mood and anxiety. Reports passive SI, but contracts for safety and denies any current plan or intention of hurting self . Mood does present more subdued and affect is vaguely anxious. As above, she ruminates about her current living situation, and is expressing frustration about her mother wanting her to return to live at [facility].  Behavior on unit in good control. Thus far tolerating medications well- in the process of tapering off Effexor XR and switching to Lexapro. No symptoms of Venlafaxine WDL noted or reported .   Principal Problem: MDD (major depressive disorder), severe ([place]) Diagnosis:   Patient Active Problem List   Diagnosis Date Noted  . MDD (major depressive disorder), severe ([place]) [F32.2] 09/09/2017  . Autism spectrum disorder [F84.0] 05/17/2017  . MDD (major depressive disorder), recurrent severe, without psychosis ([place]) [F33.2] 05/16/2017   Total Time spent with patient: 20 minutes  Past Psychiatric History: See h&P  Past Medical History:  Past Medical History:  Diagnosis Date  . Autism   . GERD (gastroesophageal reflux disease)   . Hypertension     Past Surgical History:  Procedure Laterality Date  . TONSILLECTOMY     Family History: History reviewed. No pertinent family history. Family Psychiatric  History:  See H&P Social History:  Social History   Substance and Sexual Activity  Alcohol Use No     Social History   Substance and Sexual Activity  Drug Use No    Social History   Socioeconomic History  . Marital status: Single    Spouse name: None  . Number of children: None  . Years of education: None  . Highest education level: None  Social Needs  . Financial resource strain: None  . Food insecurity - worry: None  . Food insecurity - inability: None  . Transportation needs - medical: None  . Transportation needs - non-medical: None  Occupational History  . None  Tobacco Use  . Smoking status: Never Smoker  . Smokeless tobacco: Never Used  Substance and Sexual Activity  . Alcohol use: No  . Drug use: No  . Sexual activity: No  Other Topics Concern  . None  Social History Narrative  . None   Additional Social History:    Pain Medications: see MAR Prescriptions: see MAR Over the Counter: see MAR History of alcohol / drug use?: No history of alcohol / drug abuse  Sleep: Good  Appetite:  Good  Current Medications: Current Facility-Administered Medications  Medication Dose Route Frequency Provider Last Rate Last Dose  . acetaminophen (TYLENOL) tablet 650 mg  650 mg Oral Q6H PRN Rankin, Shuvon B, NP   650 mg at 09/10/17 0743  . albuterol (PROVENTIL HFA;VENTOLIN HFA) 108 (90 Base) MCG/ACT inhaler 2 puff  2 puff Inhalation Q6H PRN Rankin, Shuvon B, NP  2 puff at 09/12/17 2141  . alum & mag hydroxide-simeth (MAALOX/MYLANTA) 200-200-20 MG/5ML suspension 30 mL  30 mL Oral Q4H PRN Rankin, Shuvon B, NP      . ARIPiprazole (ABILIFY) tablet 20 mg  20 mg Oral Daily Rankin, Shuvon B, NP   20 mg at 09/13/17 0810  . escitalopram (LEXAPRO) tablet 5 mg  5 mg Oral Daily Cobos, Myer Peer, MD   5 mg at 09/13/17 0811  . fluticasone (FLONASE) 50 MCG/ACT nasal spray 1 spray  1 spray Each Nare Daily Rankin, Shuvon B, NP   1 spray at 09/13/17 0811  . gabapentin (NEURONTIN) capsule 100 mg   100 mg Oral BID Cobos, Myer Peer, MD   100 mg at 09/13/17 0811  . hydrochlorothiazide (MICROZIDE) capsule 12.5 mg  12.5 mg Oral Daily Rankin, Shuvon B, NP   12.5 mg at 09/13/17 0810  . hydrOXYzine (ATARAX/VISTARIL) tablet 25 mg  25 mg Oral TID PRN Rankin, Shuvon B, NP   25 mg at 09/12/17 2141  . ibuprofen (ADVIL,MOTRIN) tablet 600 mg  600 mg Oral QID PRN Laverle Hobby, PA-C   600 mg at 09/10/17 0507  . losartan (COZAAR) tablet 25 mg  25 mg Oral Daily Rankin, Shuvon B, NP   25 mg at 09/13/17 0811  . magnesium hydroxide (MILK OF MAGNESIA) suspension 30 mL  30 mL Oral Daily PRN Rankin, Shuvon B, NP      . pantoprazole (PROTONIX) EC tablet 40 mg  40 mg Oral Daily Rankin, Shuvon B, NP   40 mg at 09/13/17 0811  . traZODone (DESYREL) tablet 100 mg  100 mg Oral QHS PRN Rankin, Shuvon B, NP   100 mg at 09/12/17 2142  . venlafaxine XR (EFFEXOR-XR) 24 hr capsule 37.5 mg  37.5 mg Oral Q breakfast Cobos, Myer Peer, MD   37.5 mg at 09/13/17 0811  . verapamil (CALAN-SR) CR tablet 240 mg  240 mg Oral QHS Rankin, Shuvon B, NP   240 mg at 09/12/17 2141    Lab Results:  No results found for this or any previous visit (from the past 48 hour(s)).  Blood Alcohol level:  No results found for: [facility]  Metabolic Disorder Labs: Lab Results  Component Value Date   HGBA1C 5.0 09/11/2017   MPG 96.8 09/11/2017   MPG 120 (H) 04/12/2011   Lab Results  Component Value Date   PROLACTIN 30.2 04/12/2011   Lab Results  Component Value Date   CHOL 117 09/11/2017   TRIG 105 09/11/2017   HDL 43 09/11/2017   CHOLHDL 2.7 09/11/2017   VLDL 21 09/11/2017   LDLCALC 53 09/11/2017   LDLCALC 56 04/12/2011    Physical Findings: AIMS: Facial and Oral Movements Muscles of Facial Expression: None, normal Lips and Perioral Area: None, normal Jaw: None, normal Tongue: None, normal,Extremity Movements Upper (arms, wrists, hands, fingers): None, normal Lower (legs, knees, ankles, toes): None, normal, Trunk Movements Neck,  shoulders, hips: None, normal, Overall Severity Severity of abnormal movements (highest score from questions above): None, normal Incapacitation due to abnormal movements: None, normal Patient's awareness of abnormal movements (rate only patient's report): No Awareness, Dental Status Current problems with teeth and/or dentures?: No Does patient usually wear dentures?: No  CIWA:  CIWA-Ar Total: 1 COWS:  COWS Total Score: 2  Musculoskeletal: Strength & Muscle Tone: within normal limits Gait & Station: normal Patient leans: N/A  Psychiatric Specialty Exam: Physical Exam  Nursing note and vitals reviewed. Constitutional: She is oriented to person,  place, and time. She appears well-developed and well-nourished.  Respiratory: Effort normal.  Musculoskeletal: Normal range of motion.  Neurological: She is alert and oriented to person, place, and time.  Skin: Skin is warm.    Review of Systems  Constitutional: Negative.   HENT: Negative.   Eyes: Negative.   Respiratory: Negative.   Cardiovascular: Negative.   Gastrointestinal: Negative.   Genitourinary: Negative.   Musculoskeletal: Negative.   Skin: Negative.   Neurological: Negative.   Endo/Heme/Allergies: Negative.   Psychiatric/Behavioral: Negative.   mild headache, no chest pain, no vomiting  Blood pressure (!) 153/103, pulse 87, temperature 98.5 F (36.9 C), temperature source Oral, resp. rate 18, height '5\' 5"'$  (1.651 m), weight 73.9 kg (163 lb), SpO2 98 %.Body mass index is 27.12 kg/m.  General Appearance: Well Groomed  Eye Contact:  Good  Speech:  Normal Rate  Volume:  Normal  Mood:  states feeling depressed   Affect:  constricted, but does smile at times   Thought Process:  Linear and Descriptions of Associations: Intact  Orientation:  Full (Time, Place, and Person)  Thought Content:  no hallucinations, no delusions, not internally preoccupied - ruminative about stressors as above   Suicidal Thoughts:  Yes.  without  intent/plan- denies current suicidal or self injurious plans/intentions and contracts for safety on unit- is future oriented, states " I want to be able to live my life "  Homicidal Thoughts:  No  Memory:  recent and remote grossly intact   Judgement:  Fair- improving   Insight:  Fair- improving   Psychomotor Activity:  Normal  Concentration:  Concentration: Good and Attention Span: Good  Recall:  Good  Fund of Knowledge:  Good  Language:  Good  Akathisia:  No  Handed:  Right  AIMS (if indicated):     Assets:  Communication Skills Desire for Improvement Financial Resources/Insurance Housing Physical Health Social Support Transportation  ADL's:  Intact  Cognition:  WNL  Sleep:  Number of Hours: 6.5   Assessment - Sharnika reports ongoing depression, anxiety and passive SI, but contracts for safety on unit. She is focused on her psychosocial stressors, mainly her reluctance to return to [employer] , whereas her mother who is her legal guardian, wanting her to return there.  In the process of switching from Effexor XR to Lexapro- thus far tolerating well . Has also requested to taper off Neurontin because she feels it does not help and is causing her to gain weight.  Treatment Plan Summary: Treatment Plan reviewed as below 1/12 Daily contact with patient to assess and evaluate symptoms and progress in treatment, Medication management and Plan is to:  -Treatment team working on disposition planning as below -Continue Abilify 20 mg PO Daily for mood stability -Discontinue Effexor XR - see rationale above  -Discontinue Neurontin- see rationale above  -Increase Lexapro to 10 mgrs QDAY for depression  -Encourage group therapy particiaption to work on Radiographer, therapeutic and symptom reduction -As discussed with staff, patient , CSW :  having a meeting with mother, patient , CSW, Probation officer(  and if possible appropriate  [employer] ) would be indicated to discuss above  circumstances and decide upon best course of action/disposition plan. CSW to contact mother to try to arrange .    Jenne Campus, MD 09/13/2017, 8:20 AM   Patient ID: Irven Shelling, female   DOB: 1996/05/06, 22 y.o.   MRN: [id number]

## 2017-09-13 NOTE — Progress Notes (Signed)
D- Affect - appropriate.  Mood - depressed.  Behavior - appropriate with encouragement, direction and support.  Interacts appropriately with peers and staff.   Participates in goals, groups, counselor lead group and recreation.   Goal for today is to get a ist of new group homes.  Stated plan and goal for today is to stay awake and to stay occupied.    A- Medications per MD order.  Support given throughout the day,  1:1 spent time with patient.  R- Following treatment plan plan.  Denies SI/HI/AH and VH.  Contracts for safety.

## 2017-09-13 NOTE — Progress Notes (Signed)
Nursing Progress Note: 7p-7a D: Pt currently presents with a depressed/anxious affect and behavior. Pt states "I would hurt myself I was at the house I was at. They took my phone, and that isn't fair. I wanted to cut with a bobby pin and kill myself. I want new housing. My mom needs to understand that." Interacting appropriately with the milieu. Pt reports good sleep during the previous night with current medication regimen. Pt did attend wrap-up group.  A: Pt provided with medications per providers orders. Pt's labs and vitals were monitored throughout the night. Pt supported emotionally and encouraged to express concerns and questions. Pt educated on medications.  R: Pt's safety ensured with 15 minute and environmental checks. Pt currently denies HI and AVH and endorses passive SI. Pt verbally contracts to seek staff if SI,HI, or AVH occurs and to consult with staff before acting on any harmful thoughts. Will continue to monitor.

## 2017-09-14 NOTE — Plan of Care (Signed)
Patient verbalizes understanding of information, education provided.  Patient is med compliant and reports intent to remain so upon discharge.  Patient has not engaged in self harm and denies thoughts to do so.

## 2017-09-14 NOTE — Progress Notes (Signed)
D: Patient observed up and visible in the milieu. Restless at times. Patient states she is "anxious but okay." Patient's affect anxious, mood worried. Per self inventory and discussions with writer, rates depression at a 9/10, hopelessness at a 9/10 and anxiety at an 8/10. Rates sleep as good, appetite as good, energy as low and concentration as good.  States goal for today is to "talk to the doctor about seeing peer support specialist." Denies pain, physical complaints.   A: Medicated per orders, no prns requested or required. Level III obs in place for safety. Emotional support offered and self inventory reviewed. Encouraged completion of Suicide Safety Plan and programming participation. Discussed POC with MD.   R: Patient verbalizes understanding of POC. Patient denies HI/AVH however states she will attempt suicide if discharged to previous placement. "I need you all to find another place for me to go." She contracts for safety and remains safe on level III obs. Will continue to monitor closely and make verbal contact frequently.

## 2017-09-14 NOTE — [facility] Group Notes (Signed)
[facility] Group Notes:    Date:  09/14/2017  Time:  4:55 PM  Type of Therapy:  Nurse Education  /  Healthy Support Systems: The group focuses on teaching patients how to develop healqQQthy support systems and then = hon   Participation Level:  Minimal  Participation Quality:  Attentive  Affect:  Excited, Flat and Irritable  Cognitive:  Alert  Insight:  Limited  Engagement in Group:  Improving  Modes of Intervention:  Clarification  Summary of Progress/Problems:  Brittany Palmer, Brittany Palmer 09/14/2017, 4:55 PM

## 2017-09-14 NOTE — Progress Notes (Signed)
Nursing Progress Note: 7p-7a D: Pt currently presents with a pleasant/childlike/anxious affect and behavior. Pt states "I had a good day. I was able to talk to a Child psychotherapistsocial worker about placement. I am ready for my family meeting tomorrow. I am hopeful it will b good." Interacting appropriately with the milieu. Pt reports fair sleep during the previous night with current medication regimen. Pt did attend wrap-up group.  A: Pt provided with medications per providers orders. Pt's labs and vitals were monitored throughout the night. Pt supported emotionally and encouraged to express concerns and questions. Pt educated on medications.  R: Pt's safety ensured with 15 minute and environmental checks. Pt currently denies SI, HI, and AVH. Pt verbally contracts to seek staff if SI,HI, or AVH occurs and to consult with staff before acting on any harmful thoughts. Will continue to monitor.

## 2017-09-14 NOTE — Progress Notes (Addendum)
[facility] MD Progress Note  09/14/2017 1:23 PM Brittany Palmer  MRN:  [id number]   Subjective:  Patient reports she continues to feel depressed, anxious. She denies current suicidal ideations. Denies medication side effects .  Objective: I have reviewed chart notes and have met with patient. Patient presents with a vaguely depressed, subdued affect, and reports she continues to feel depressed, which she attributes to her current psychosocial stressors. She has reported not wanting to return to her current living situation ( [facility]) because she feels it is too restrictive . She states she is no longer happy there , resulting in increased symptoms and depression and states " I need to break the cycle of coming back to the hospital again and again". She does not , however, have any specific plan of where else she would go after discharge if she chooses not to return there, and states " I have heard of [employer]".  Denies current suicidal ideations and contracts for safety on unit. She denies medication side effects. No disruptive or agitated behaviors on unit.   Principal Problem: MDD (major depressive disorder), severe ([place]) Diagnosis:   Patient Active Problem List   Diagnosis Date Noted  . MDD (major depressive disorder), severe ([place]) [F32.2] 09/09/2017  . Autism spectrum disorder [F84.0] 05/17/2017  . MDD (major depressive disorder), recurrent severe, without psychosis ([place]) [F33.2] 05/16/2017   Total Time spent with patient: 20 minutes  Past Psychiatric History: See h&P  Past Medical History:  Past Medical History:  Diagnosis Date  . Autism   . GERD (gastroesophageal reflux disease)   . Hypertension     Past Surgical History:  Procedure Laterality Date  . TONSILLECTOMY     Family History: History reviewed. No pertinent family history. Family Psychiatric  History: See H&P Social History:  Social History   Substance and Sexual Activity  Alcohol Use No      Social History   Substance and Sexual Activity  Drug Use No    Social History   Socioeconomic History  . Marital status: Single    Spouse name: None  . Number of children: None  . Years of education: None  . Highest education level: None  Social Needs  . Financial resource strain: None  . Food insecurity - worry: None  . Food insecurity - inability: None  . Transportation needs - medical: None  . Transportation needs - non-medical: None  Occupational History  . None  Tobacco Use  . Smoking status: Never Smoker  . Smokeless tobacco: Never Used  Substance and Sexual Activity  . Alcohol use: No  . Drug use: No  . Sexual activity: No  Other Topics Concern  . None  Social History Narrative  . None   Additional Social History:    Pain Medications: see MAR Prescriptions: see MAR Over the Counter: see MAR History of alcohol / drug use?: No history of alcohol / drug abuse  Sleep: Fair  Appetite:  Good  Current Medications: Current Facility-Administered Medications  Medication Dose Route Frequency Provider Last Rate Last Dose  . acetaminophen (TYLENOL) tablet 650 mg  650 mg Oral Q6H PRN Rankin, Shuvon B, NP   650 mg at 09/10/17 0743  . albuterol (PROVENTIL HFA;VENTOLIN HFA) 108 (90 Base) MCG/ACT inhaler 2 puff  2 puff Inhalation Q6H PRN Rankin, Shuvon B, NP   2 puff at 09/12/17 2141  . alum & mag hydroxide-simeth (MAALOX/MYLANTA) 200-200-20 MG/5ML suspension 30 mL  30 mL Oral Q4H PRN Rankin,  Shuvon B, NP      . ARIPiprazole (ABILIFY) tablet 20 mg  20 mg Oral Daily Rankin, Shuvon B, NP   20 mg at 09/14/17 0837  . escitalopram (LEXAPRO) tablet 10 mg  10 mg Oral Daily Sydny Schnitzler, Myer Peer, MD   10 mg at 09/14/17 0837  . fluticasone (FLONASE) 50 MCG/ACT nasal spray 1 spray  1 spray Each Nare Daily Rankin, Shuvon B, NP   1 spray at 09/14/17 0837  . hydrochlorothiazide (MICROZIDE) capsule 12.5 mg  12.5 mg Oral Daily Rankin, Shuvon B, NP   12.5 mg at 09/14/17 0837  .  hydrOXYzine (ATARAX/VISTARIL) tablet 25 mg  25 mg Oral TID PRN Rankin, Shuvon B, NP   25 mg at 09/13/17 2241  . ibuprofen (ADVIL,MOTRIN) tablet 600 mg  600 mg Oral QID PRN Laverle Hobby, PA-C   600 mg at 09/10/17 0507  . losartan (COZAAR) tablet 25 mg  25 mg Oral Daily Rankin, Shuvon B, NP   25 mg at 09/14/17 0837  . magnesium hydroxide (MILK OF MAGNESIA) suspension 30 mL  30 mL Oral Daily PRN Rankin, Shuvon B, NP      . pantoprazole (PROTONIX) EC tablet 40 mg  40 mg Oral Daily Rankin, Shuvon B, NP   40 mg at 09/14/17 0837  . traZODone (DESYREL) tablet 100 mg  100 mg Oral QHS PRN Rankin, Shuvon B, NP   100 mg at 09/13/17 2241  . verapamil (CALAN-SR) CR tablet 240 mg  240 mg Oral QHS Rankin, Shuvon B, NP   240 mg at 09/13/17 2241    Lab Results:  No results found for this or any previous visit (from the past 48 hour(s)).  Blood Alcohol level:  No results found for: [facility]  Metabolic Disorder Labs: Lab Results  Component Value Date   HGBA1C 5.0 09/11/2017   MPG 96.8 09/11/2017   MPG 120 (H) 04/12/2011   Lab Results  Component Value Date   PROLACTIN 30.2 04/12/2011   Lab Results  Component Value Date   CHOL 117 09/11/2017   TRIG 105 09/11/2017   HDL 43 09/11/2017   CHOLHDL 2.7 09/11/2017   VLDL 21 09/11/2017   LDLCALC 53 09/11/2017   LDLCALC 56 04/12/2011    Physical Findings: AIMS: Facial and Oral Movements Muscles of Facial Expression: None, normal Lips and Perioral Area: None, normal Jaw: None, normal Tongue: None, normal,Extremity Movements Upper (arms, wrists, hands, fingers): None, normal Lower (legs, knees, ankles, toes): None, normal, Trunk Movements Neck, shoulders, hips: None, normal, Overall Severity Severity of abnormal movements (highest score from questions above): None, normal Incapacitation due to abnormal movements: None, normal Patient's awareness of abnormal movements (rate only patient's report): No Awareness, Dental Status Current problems with teeth  and/or dentures?: No Does patient usually wear dentures?: No  CIWA:  CIWA-Ar Total: 1 COWS:  COWS Total Score: 2  Musculoskeletal: Strength & Muscle Tone: within normal limits Gait & Station: normal Patient leans: N/A  Psychiatric Specialty Exam: Physical Exam  Nursing note and vitals reviewed. Constitutional: She is oriented to person, place, and time. She appears well-developed and well-nourished.  Respiratory: Effort normal.  Musculoskeletal: Normal range of motion.  Neurological: She is alert and oriented to person, place, and time.  Skin: Skin is warm.    Review of Systems  Constitutional: Negative.   HENT: Negative.   Eyes: Negative.   Respiratory: Negative.   Cardiovascular: Negative.   Gastrointestinal: Negative.   Genitourinary: Negative.   Musculoskeletal: Negative.  Skin: Negative.   Neurological: Negative.   Endo/Heme/Allergies: Negative.   Psychiatric/Behavioral: Negative.   denies chest pain, no shortness of breath, no vomiting , no fever   Blood pressure 115/69, pulse (!) 106, temperature 98.3 F (36.8 C), resp. rate 18, height '5\' 5"'$  (1.651 m), weight 73.9 kg (163 lb), SpO2 98 %.Body mass index is 27.12 kg/m.  General Appearance: Well Groomed  Eye Contact:  Good  Speech:  Normal Rate  Volume:  Normal  Mood:  states feeling depressed   Affect:  vaguely constricted, anxious  Thought Process:  Linear and Descriptions of Associations: Intact  Orientation:  Other:  fully alert and attentive   Thought Content:  denies hallucinations, does not express delusional ideations, remains focused on stressors.  Suicidal Thoughts:  No-at this time denies suicidal or self injurious ideations and contracts for safety on unit, denies homicidal or violent ideations  Homicidal Thoughts:  No, specifically also denies homicidal or violent ideations towards mother or towards any staff or clients at residential setting she resides in.  Memory:  recent and remote grossly intact    Judgement:  improving   Insight:  Fair- improving   Psychomotor Activity:  Normal  Concentration:  Concentration: Good and Attention Span: Good  Recall:  Good  Fund of Knowledge:  Good  Language:  Good  Akathisia:  No  Handed:  Right  AIMS (if indicated):     Assets:  Communication Skills Desire for Improvement Financial Resources/Insurance Housing Physical Health Social Support Transportation  ADL's:  Intact  Cognition:  WNL  Sleep:  Number of Hours: 6.75   Assessment - patient presents with vaguely depressed mood and subdued, anxious affect . She does present more depressed than she did earlier in the week and expresses it is a result of her not being happy with the idea of needing to return to her current living situation. She states " I cannot use my phone as I want to, I cannot listen to music I want to , and I want to be more independent ". At this time, however, does not endorse any alternative disposition plan other than going to a shelter and as per CSW, mother is legal guardian and in support of patient returning to residential setting. Patient denying current SI but reporting not feeling safe to discharge at present.  Thus far tolerating Lexapro trial well and not endorsing any Venlafaxine WDL symptoms .   Treatment Plan Summary: Treatment Plan reviewed as below 1/13 Daily contact with patient to assess and evaluate symptoms and progress in treatment, Medication management and Plan is to:  -Treatment team working on disposition planning as below -Continue Abilify 20 mg PO Daily for mood stability -Continue Lexapro  10 mgrs QDAY for depression , anxiety  -Encourage group therapy particiaption to work on coping skills and symptom reduction -Treatment team working on disposition planning and working on planning family meeting in order to discuss and clarify disposition plans and options.   Jenne Campus, MD 09/14/2017, 1:23 PM   Patient ID: Irven Shelling, female   DOB:  07-20-1996, 22 y.o.   MRN: [id number]

## 2017-09-14 NOTE — [facility] Group Notes (Signed)
[facility]  Date/Time:  09/14/2017 10:00-11:00AM  Type of Therapy and Topic:  Group Therapy:  Healthy and Unhealthy Supports  Participation Level:  Active   Description of Group:  Patients in this group were introduced to the idea of adding a variety of healthy supports to address the various needs in their lives. The picture on the front of Sunday's workbook was used to demonstrate why more supports are needed in every patient's life.  Patients identified and described healthy supports versus unhealthy supports in general, then gave examples of each in their own lives.   They discussed what additional healthy supports could be helpful in their recovery and wellness after discharge in order to prevent future hospitalizations.   An emphasis was placed on using counselor, doctor, therapy groups, 12-step groups, and problem-specific support groups to expand supports.  They also worked as a group on developing a specific plan for several patients to deal with unhealthy supports through boundary-setting, psychoeducation with loved ones, and even termination of relationships.   Therapeutic Goals:   1)  discuss importance of adding supports to stay well once out of the hospital  2)  compare healthy versus unhealthy supports and identify some examples of each  3)  generate ideas and descriptions of healthy supports that can be added  4)  offer mutual support about how to address unhealthy supports  5)  encourage active participation in and adherence to discharge plan    Summary of Patient Progress:  The patient shared that the she feels her mother has always been controlling and does not listen to her.  We talked about how she can use "I messages" in the family session tomorrow with her mother to share her feelings, and why she thinks it would be best for her to have a non-family member as guardian.   Therapeutic Modalities:   Motivational Interviewing Brief Solution-Focused  Therapy  Ambrose MantleMareida Grossman-Orr, LCSW

## 2017-09-15 NOTE — [facility] Group Notes (Signed)
[facility] LCSW Group Therapy Note  Date/Time: 09/15/17, 1315  Type of Therapy and Topic:  Group Therapy:  Overcoming Obstacles  Participation Level:  moderate  Description of Group:    In this group patients will be encouraged to explore what they see as obstacles to their own wellness and recovery. They will be guided to discuss their thoughts, feelings, and behaviors related to these obstacles. The group will process together ways to cope with barriers, with attention given to specific choices patients can make. Each patient will be challenged to identify changes they are motivated to make in order to overcome their obstacles. This group will be process-oriented, with patients participating in exploration of their own experiences as well as giving and receiving support and challenge from other group members.  Therapeutic Goals: 1. Patient will identify personal and current obstacles as they relate to admission. 2. Patient will identify barriers that currently interfere with their wellness or overcoming obstacles.  3. Patient will identify feelings, thought process and behaviors related to these barriers. 4. Patient will identify two changes they are willing to make to overcome these obstacles:    Summary of Patient Progress:  Pt identified depression/anxiety and her living situation as current obstacles.  Pt participated in group discussion regarding the struggle to find ways to overcome both short term and long term obstacles.       Therapeutic Modalities:   Cognitive Behavioral Therapy Solution Focused Therapy Motivational Interviewing Relapse Prevention Therapy  Brittany SquibbGreg Annjanette Wertenberger, LCSW

## 2017-09-15 NOTE — Progress Notes (Signed)
Adult Services Patient-Family Contact/Session  Attendees:  Mother and legal guardian, Althea CharonDana Michiels by phone, Dr Jama Flavorsobos, CSW Daleen SquibbGreg Keni Elison,   Goal(s):  Discuss housing plans due to pt refusal to return to her group living situation.  Safety Concerns:    Narrative:  Pt reports she needs more freedom, mother reports that pt is already in the best situation and she is not willing to move her.  Mother is willing to allow someone else to assume the role of legal guardian.  Barrier(s):    Interventions:    Recommendation(s):    Follow-up Required:  Yes  Explanation:  CSW to contact [place] [facility] to discuss options with guardianship. Lorri FrederickWierda, Jeniah Kishi Jon 09/15/2017, 4:23 PM

## 2017-09-15 NOTE — Progress Notes (Signed)
Recreation Therapy Notes  Date: 09/15/17 Time: 0930 Location: [address] Dayroom  Group Topic: Stress Management  Goal Area(s) Addresses:  Patient will verbalize importance of using healthy stress management.  Patient will identify positive emotions associated with healthy stress management.   Behavioral Response: Engaged  Intervention: Stress Management  Activity :  [place].  LRT introduced the stress management technique of guided imagery.  LRT read [place] script to allow patients to visualize being in [place].  Patients were to follow along as script was read to engage in activity.  Education:  Stress Management, Discharge Planning.   Education Outcome: Acknowledges edcuation/In group clarification offered/Needs additional education  Clinical Observations/Feedback: Pt attended group.     Brittany [place], LRT/CTRS         Brittany Palmer 09/15/2017 12:42 PM 

## 2017-09-15 NOTE — Progress Notes (Signed)
Pt presents with an animated affect and anxious mood. Pt noted to be attention seeking, childlike and fidgety on approach. Pt reports increased anxiety and depression today. Pt endorses passive SI with no plan or intent. Pt verbally contracts for safety. Pt reports good sleep last night. Pt reported that she have a family session with her mom scheduled for today.Pt c/o chest pains for the last three days after starting Lexapro. Pt last had chest pains on 1/13. Pt also c/o having a build up of wax to her right ear that's affecting her balance.Treatment team made aware of pt complaints. Medications reviewed and administered as ordered per MD. Verbal support provided. Pt encouraged to attend groups. 15 minute checks performed for safety.  Pt compliant with tx plan.

## 2017-09-15 NOTE — Progress Notes (Signed)
Patient ID: Brittany [place], female   DOB: 12/31/1995, 22 y.o.   MRN: [id number]  Pt currently presents with a brighter affect and hyperactive behavior. Pt reports to Clinical research associatewriter that their goal is to "have hope for tomorrow and talk to DSS." Pt reports that she had a good family session today and that she is hoping she can be more independent now. Pt reports good sleep with current medication regimen. Pt complains of ongoing wheezing with activity, with no relief from inhaler.   Pt provided with medications per providers orders. Pt's labs and vitals were monitored throughout the night. Pt given a 1:1 about emotional and mental status. Pt supported and encouraged to express concerns and questions. Pt educated on medications and assertiveness techniques.   Pt's safety ensured with 15 minute and environmental checks. Pt currently denies SI/HI and A/V hallucinations. Pt verbally agrees to seek staff if SI/HI or A/VH occurs and to consult with staff before acting on any harmful thoughts. Pt encouraged to speak to MD about respiratory symptoms, pt reports she wishes to have a nebulizer. Will continue POC.

## 2017-09-15 NOTE — Progress Notes (Signed)
[facility] MD Progress Note  09/15/2017 5:32 PM Briyonna Omara  MRN:  [id number]   Subjective:  Patient reports feeling anxious. She is focused on disposition issues and on guardianship matters. Denies suicidal ideations at present, but states that she does not want to discharge back to her [place] and that she feels she would become suicidal if this occurs . Denies medication side effects.  Objective: I have reviewed chart notes and have met with patient. Today we had a family meeting with patient, mother ( who is legal guardian) , Probation officer, [place] via Biomedical engineer . Patient stated that she is adamant in not wanting to return to [facility] and expressed that it was too restrictive for her , using as examples not being allowed a phone with Internet, not being allowed to listen to certain music, and needing to participate in church activities she would rather not attend . She stated that she wanted to be able to be more independent and described wanting to be better able to exercise her free will as she is now an adult . Mother stated that [place] has been a safe and protective environment for patient , that restrictions are a way of ensuring her safety, and that patient has been allowed increased freedoms commensurate with her age . She pointed out that patient is now considered a Careers adviser member there. She expressed concern that patient would not be able to function independently and would be at risk of getting hurt or being taken advantage of  if she lived independently . Patient did report to mother that she was not willing to return to [place] at this time and that she would consider suicide if forced to. They discussed potential change of guardianship , and mother reported she would agree to a transfer of guardianship if patient desired it . She does contract for safety on the unit, and denies current SI.  Patient stated, after the session with mother, that she felt she was  able to convey her concerns to her mother. No disruptive or agitated behaviors on unit .     Principal Problem: MDD (major depressive disorder), severe ([place]) Diagnosis:   Patient Active Problem List   Diagnosis Date Noted  . MDD (major depressive disorder), severe ([place]) [F32.2] 09/09/2017  . Autism spectrum disorder [F84.0] 05/17/2017  . MDD (major depressive disorder), recurrent severe, without psychosis ([place]) [F33.2] 05/16/2017   Total Time spent with patient: 45 minutes more than 50 % of time spent on counseling and disposition planning   Past Psychiatric History: See h&P  Past Medical History:  Past Medical History:  Diagnosis Date  . Autism   . GERD (gastroesophageal reflux disease)   . Hypertension     Past Surgical History:  Procedure Laterality Date  . TONSILLECTOMY     Family History: History reviewed. No pertinent family history. Family Psychiatric  History: See H&P Social History:  Social History   Substance and Sexual Activity  Alcohol Use No     Social History   Substance and Sexual Activity  Drug Use No    Social History   Socioeconomic History  . Marital status: Single    Spouse name: None  . Number of children: None  . Years of education: None  . Highest education level: None  Social Needs  . Financial resource strain: None  . Food insecurity - worry: None  . Food insecurity - inability: None  . Transportation needs - medical: None  . Transportation needs -  non-medical: None  Occupational History  . None  Tobacco Use  . Smoking status: Never Smoker  . Smokeless tobacco: Never Used  Substance and Sexual Activity  . Alcohol use: No  . Drug use: No  . Sexual activity: No  Other Topics Concern  . None  Social History Narrative  . None   Additional Social History:    Pain Medications: see MAR Prescriptions: see MAR Over the Counter: see MAR History of alcohol / drug use?: No history of alcohol / drug abuse  Sleep: improving    Appetite:  Good  Current Medications: Current Facility-Administered Medications  Medication Dose Route Frequency Provider Last Rate Last Dose  . acetaminophen (TYLENOL) tablet 650 mg  650 mg Oral Q6H PRN Rankin, Shuvon B, NP   650 mg at 09/10/17 0743  . albuterol (PROVENTIL HFA;VENTOLIN HFA) 108 (90 Base) MCG/ACT inhaler 2 puff  2 puff Inhalation Q6H PRN Rankin, Shuvon B, NP   2 puff at 09/15/17 1202  . alum & mag hydroxide-simeth (MAALOX/MYLANTA) 200-200-20 MG/5ML suspension 30 mL  30 mL Oral Q4H PRN Rankin, Shuvon B, NP      . ARIPiprazole (ABILIFY) tablet 20 mg  20 mg Oral Daily Rankin, Shuvon B, NP   20 mg at 09/15/17 0808  . escitalopram (LEXAPRO) tablet 10 mg  10 mg Oral Daily Starlina Lapre, Myer Peer, MD   10 mg at 09/15/17 0807  . fluticasone (FLONASE) 50 MCG/ACT nasal spray 1 spray  1 spray Each Nare Daily Rankin, Shuvon B, NP   1 spray at 09/15/17 0808  . hydrochlorothiazide (MICROZIDE) capsule 12.5 mg  12.5 mg Oral Daily Rankin, Shuvon B, NP   12.5 mg at 09/15/17 0808  . hydrOXYzine (ATARAX/VISTARIL) tablet 25 mg  25 mg Oral TID PRN Rankin, Shuvon B, NP   25 mg at 09/14/17 2256  . ibuprofen (ADVIL,MOTRIN) tablet 600 mg  600 mg Oral QID PRN Laverle Hobby, PA-C   600 mg at 09/10/17 0507  . losartan (COZAAR) tablet 25 mg  25 mg Oral Daily Rankin, Shuvon B, NP   25 mg at 09/15/17 0808  . magnesium hydroxide (MILK OF MAGNESIA) suspension 30 mL  30 mL Oral Daily PRN Rankin, Shuvon B, NP      . pantoprazole (PROTONIX) EC tablet 40 mg  40 mg Oral Daily Rankin, Shuvon B, NP   40 mg at 09/15/17 0808  . traZODone (DESYREL) tablet 100 mg  100 mg Oral QHS PRN Rankin, Shuvon B, NP   100 mg at 09/14/17 2256  . verapamil (CALAN-SR) CR tablet 240 mg  240 mg Oral QHS Rankin, Shuvon B, NP   240 mg at 09/14/17 2115    Lab Results:  No results found for this or any previous visit (from the past 48 hour(s)).  Blood Alcohol level:  No results found for: [facility]  Metabolic Disorder Labs: Lab Results   Component Value Date   HGBA1C 5.0 09/11/2017   MPG 96.8 09/11/2017   MPG 120 (H) 04/12/2011   Lab Results  Component Value Date   PROLACTIN 30.2 04/12/2011   Lab Results  Component Value Date   CHOL 117 09/11/2017   TRIG 105 09/11/2017   HDL 43 09/11/2017   CHOLHDL 2.7 09/11/2017   VLDL 21 09/11/2017   LDLCALC 53 09/11/2017   LDLCALC 56 04/12/2011    Physical Findings: AIMS: Facial and Oral Movements Muscles of Facial Expression: None, normal Lips and Perioral Area: None, normal Jaw: None, normal Tongue: None, normal,Extremity Movements  Upper (arms, wrists, hands, fingers): None, normal Lower (legs, knees, ankles, toes): None, normal, Trunk Movements Neck, shoulders, hips: None, normal, Overall Severity Severity of abnormal movements (highest score from questions above): None, normal Incapacitation due to abnormal movements: None, normal Patient's awareness of abnormal movements (rate only patient's report): No Awareness, Dental Status Current problems with teeth and/or dentures?: No Does patient usually wear dentures?: No  CIWA:  CIWA-Ar Total: 1 COWS:  COWS Total Score: 2  Musculoskeletal: Strength & Muscle Tone: within normal limits Gait & Station: normal Patient leans: N/A  Psychiatric Specialty Exam: Physical Exam  Nursing note and vitals reviewed. Constitutional: She is oriented to person, place, and time. She appears well-developed and well-nourished.  Respiratory: Effort normal.  Musculoskeletal: Normal range of motion.  Neurological: She is alert and oriented to person, place, and time.  Skin: Skin is warm.    Review of Systems  Constitutional: Negative.   HENT: Negative.   Eyes: Negative.   Respiratory: Negative.   Cardiovascular: Negative.   Gastrointestinal: Negative.   Genitourinary: Negative.   Musculoskeletal: Negative.   Skin: Negative.   Neurological: Negative.   Endo/Heme/Allergies: Negative.   Psychiatric/Behavioral: Negative.    denies chest pain, no shortness of breath, no vomiting , no fever   Blood pressure 124/66, pulse (!) 106, temperature 98.3 F (36.8 C), temperature source Oral, resp. rate 16, height '5\' 5"'$  (1.651 m), weight 73.9 kg (163 lb), SpO2 98 %.Body mass index is 27.12 kg/m.  General Appearance: Well Groomed  Eye Contact:  Good  Speech:  Normal Rate  Volume:  Normal  Mood:  mood partially improved, less depressed   Affect:  more reactive, anxious   Thought Process:  Linear and Descriptions of Associations: Intact  Orientation:  Other:  fully alert and attentive   Thought Content:  denies hallucinations, does not express delusional ideations, remains focused on stressors.  Suicidal Thoughts:  patient makes suicidal statement contingent on being discharged to [facility] as above - states she is currently not suicidal and contracts for safety on unit but states she would be unsafe and suicidal if needing to return there   Homicidal Thoughts:  No, specifically also denies homicidal or violent ideations towards mother or towards any staff or clients at residential setting she resides in.  Memory:  recent and remote grossly intact   Judgement:  Fair- improving   Insight:  Fair- improving   Psychomotor Activity:  Normal  Concentration:  Concentration: Good and Attention Span: Good  Recall:  Good  Fund of Knowledge:  Good  Language:  Good  Akathisia:  No  Handed:  Right  AIMS (if indicated):     Assets:  Communication Skills Desire for Improvement Financial Resources/Insurance Housing Physical Health Social Support Transportation  ADL's:  Intact  Cognition:  WNL  Sleep:  Number of Hours: 5.25   Assessment - patient is focusing on  her desire not to return to [facility] , and stating that she would not be safe and would be suicidal if needing to return there. She states that the environment is too restrictive for her and wants to be able to have more independence commensurate with now  being an adult . Mother, who is legal guardian, states that she wants patient to return to [facility] as it has been a safe, positive environment for [facility] and as she fears she would not be able to function safely in an independent setting at this time This situation has been ongoing and patient  has had recent psychiatric admissions to our unit with similar concerns /issues .  At this time patient and mother discussed option of transferring legal guardianship from mother to another entity/person.  No medication side effects  Treatment Plan Summary: Treatment Plan reviewed as below 1/14 Daily contact with patient to assess and evaluate symptoms and progress in treatment, Medication management and Plan is to:  -Treatment team working on disposition planning as below -Continue Abilify 20 mg PO Daily for mood stability -Continue Lexapro  10 mgrs QDAY for depression , anxiety  -Encourage group therapy participation to work on coping skills and symptom reduction -Treatment team working on disposition planning as above .    Jenne Campus, MD 09/15/2017, 5:32 PM   Patient ID: Irven Shelling, female   DOB: Jun 01, 1996, 22 y.o.   MRN: [id number]

## 2017-09-15 NOTE — Tx Team (Signed)
Interdisciplinary Treatment and Diagnostic Plan Update  09/15/2017 Time of Session: [address] Brittany Palmer MRN: [id number]  Principal Diagnosis: MDD (major depressive disorder), severe (HCC)  Secondary Diagnoses: Principal Problem:   MDD (major depressive disorder), severe (HCC) Active Problems:   Autism spectrum disorder   Current Medications:  Current Facility-Administered Medications  Medication Dose Route Frequency Provider Last Rate Last Dose  . acetaminophen (TYLENOL) tablet 650 mg  650 mg Oral Q6H PRN Rankin, Shuvon B, NP   650 mg at 09/10/17 0743  . albuterol (PROVENTIL HFA;VENTOLIN HFA) 108 (90 Base) MCG/ACT inhaler 2 puff  2 puff Inhalation Q6H PRN Rankin, Shuvon B, NP   2 puff at 09/15/17 1202  . alum & mag hydroxide-simeth (MAALOX/MYLANTA) 200-200-20 MG/5ML suspension 30 mL  30 mL Oral Q4H PRN Rankin, Shuvon B, NP      . ARIPiprazole (ABILIFY) tablet 20 mg  20 mg Oral Daily Rankin, Shuvon B, NP   20 mg at 09/15/17 0808  . escitalopram (LEXAPRO) tablet 10 mg  10 mg Oral Daily Cobos, Rockey Situ, MD   10 mg at 09/15/17 0807  . fluticasone (FLONASE) 50 MCG/ACT nasal spray 1 spray  1 spray Each Nare Daily Rankin, Shuvon B, NP   1 spray at 09/15/17 0808  . hydrochlorothiazide (MICROZIDE) capsule 12.5 mg  12.5 mg Oral Daily Rankin, Shuvon B, NP   12.5 mg at 09/15/17 0808  . hydrOXYzine (ATARAX/VISTARIL) tablet 25 mg  25 mg Oral TID PRN Rankin, Shuvon B, NP   25 mg at 09/14/17 2256  . ibuprofen (ADVIL,MOTRIN) tablet 600 mg  600 mg Oral QID PRN Kerry Hough, PA-C   600 mg at 09/10/17 0507  . losartan (COZAAR) tablet 25 mg  25 mg Oral Daily Rankin, Shuvon B, NP   25 mg at 09/15/17 0808  . magnesium hydroxide (MILK OF MAGNESIA) suspension 30 mL  30 mL Oral Daily PRN Rankin, Shuvon B, NP      . pantoprazole (PROTONIX) EC tablet 40 mg  40 mg Oral Daily Rankin, Shuvon B, NP   40 mg at 09/15/17 0808  . traZODone (DESYREL) tablet 100 mg  100 mg Oral QHS PRN Rankin, Shuvon B, NP   100 mg at  09/14/17 2256  . verapamil (CALAN-SR) CR tablet 240 mg  240 mg Oral QHS Rankin, Shuvon B, NP   240 mg at 09/14/17 2115   PTA Medications: Medications Prior to Admission  Medication Sig Dispense Refill Last Dose  . albuterol (PROVENTIL HFA;VENTOLIN HFA) 108 (90 Base) MCG/ACT inhaler Inhale 2 puffs into the lungs every 6 (six) hours as needed for wheezing or shortness of breath.    09/09/2017  . ARIPiprazole (ABILIFY) 20 MG tablet Take 1 tablet (20 mg total) by mouth daily. For mood stability 30 tablet 0 09/10/2017  . fluticasone (FLONASE) 50 MCG/ACT nasal spray Place 1 spray into both nostrils daily.   09/10/2017  . gabapentin (NEURONTIN) 100 MG capsule Take 2 capsules (200 mg total) by mouth 3 (three) times daily. 180 capsule 0 09/10/2017  . hydrochlorothiazide (MICROZIDE) 12.5 MG capsule Take 12.5 mg by mouth daily.   09/10/2017  . hydrOXYzine (ATARAX/VISTARIL) 25 MG tablet Take 1 tablet (25 mg total) by mouth 3 (three) times daily as needed for anxiety. 30 tablet 0 09/10/2017  . losartan (COZAAR) 25 MG tablet Take 25 mg by mouth daily.   09/10/2017  . pantoprazole (PROTONIX) 40 MG tablet Take 1 tablet (40 mg total) by mouth daily. 30 tablet 0 09/10/2017  .  traZODone (DESYREL) 100 MG tablet Take 1 tablet (100 mg total) by mouth at bedtime as needed for sleep. 30 tablet 0 09/09/2017  . venlafaxine XR (EFFEXOR-XR) 75 MG 24 hr capsule Take 1 capsule (75 mg total) by mouth daily with breakfast. For mood stability 30 capsule 0 09/10/2017  . verapamil (CALAN-SR) 240 MG CR tablet Take 240 mg by mouth at bedtime.   09/10/2017    Patient Stressors: Marital or family conflict Other: Living in group home  Patient Strengths: Manufacturing systems engineerCommunication skills Physical Health  Treatment Modalities: Medication Management, Group therapy, Case management,  1 to 1 session with clinician, Psychoeducation, Recreational therapy.   Physician Treatment Plan for Primary Diagnosis: MDD (major depressive disorder), severe (HCC) Long Term  Goal(s): Improvement in symptoms so as ready for discharge Improvement in symptoms so as ready for discharge   Short Term Goals: Ability to identify changes in lifestyle to reduce recurrence of condition will improve Ability to maintain clinical measurements within normal limits will improve Ability to verbalize feelings will improve Ability to disclose and discuss suicidal ideas Ability to demonstrate self-control will improve Ability to identify and develop effective coping behaviors will improve  Medication Management: Evaluate patient's response, side effects, and tolerance of medication regimen.  Therapeutic Interventions: 1 to 1 sessions, Unit Group sessions and Medication administration.  Evaluation of Outcomes: Progressing  Physician Treatment Plan for Secondary Diagnosis: Principal Problem:   MDD (major depressive disorder), severe (HCC) Active Problems:   Autism spectrum disorder  Long Term Goal(s): Improvement in symptoms so as ready for discharge Improvement in symptoms so as ready for discharge   Short Term Goals: Ability to identify changes in lifestyle to reduce recurrence of condition will improve Ability to maintain clinical measurements within normal limits will improve Ability to verbalize feelings will improve Ability to disclose and discuss suicidal ideas Ability to demonstrate self-control will improve Ability to identify and develop effective coping behaviors will improve     Medication Management: Evaluate patient's response, side effects, and tolerance of medication regimen.  Therapeutic Interventions: 1 to 1 sessions, Unit Group sessions and Medication administration.  Evaluation of Outcomes: Progressing   RN Treatment Plan for Primary Diagnosis: MDD (major depressive disorder), severe (HCC) Long Term Goal(s): Knowledge of disease and therapeutic regimen to maintain health will improve  Short Term Goals: Ability to identify and develop effective  coping behaviors will improve and Compliance with prescribed medications will improve  Medication Management: RN will administer medications as ordered by provider, will assess and evaluate patient's response and provide education to patient for prescribed medication. RN will report any adverse and/or side effects to prescribing provider.  Therapeutic Interventions: 1 on 1 counseling sessions, Psychoeducation, Medication administration, Evaluate responses to treatment, Monitor vital signs and CBGs as ordered, Perform/monitor CIWA, COWS, AIMS and Fall Risk screenings as ordered, Perform wound care treatments as ordered.  Evaluation of Outcomes: Progressing   LCSW Treatment Plan for Primary Diagnosis: MDD (major depressive disorder), severe (HCC) Long Term Goal(s): Safe transition to appropriate next level of care at discharge, Engage patient in therapeutic group addressing interpersonal concerns.  Short Term Goals: Engage patient in aftercare planning with referrals and resources, Increase social support and Increase skills for wellness and recovery  Therapeutic Interventions: Assess for all discharge needs, 1 to 1 time with Social worker, Explore available resources and support systems, Assess for adequacy in community support network, Educate family and significant other(s) on suicide prevention, Complete Psychosocial Assessment, Interpersonal group therapy.  Evaluation of Outcomes: Progressing  Progress in Treatment: Attending groups: Yes Participating in groups: Yes Taking medication as prescribed: Yes. Toleration medication: Yes. Family/Significant other contact made: Yes, individual(s) contacted:  mother/legal guardian Patient understands diagnosis: Yes. Discussing patient identified problems/goals with staff: Yes. Medical problems stabilized or resolved: Yes. Denies suicidal/homicidal ideation: Yes. Issues/concerns per patient self-inventory: No. Other: none  New problem(s)  identified: No, Describe:  none  New Short Term/Long Term Goal(s):  Discharge Plan or Barriers:   Reason for Continuation of Hospitalization: Medication stabilization  Estimated Length of Stay: 2-4 days.  Attendees: Patient: 09/15/2017   Physician: Dr Jama Flavors, MD 09/15/2017   Nursing: Quintella Reichert, RN 09/15/2017   RN Care Manager: 09/15/2017   Social Worker: Daleen Squibb, LCSW 09/15/2017   Recreational Therapist:  09/15/2017   Other:  09/15/2017   Other:  09/15/2017   Other: 09/15/2017           Scribe for Treatment Team: Lorri Frederick, LCSW 09/15/2017 2:06 PM

## 2017-09-16 DIAGNOSIS — Z598 Other problems related to housing and economic circumstances: Secondary | ICD-10-CM

## 2017-09-16 MED ORDER — TRAZODONE HCL 100 MG PO TABS: 100.0000 mg | ORAL_TABLET | Freq: Every evening | ORAL | 0 refills | Status: DC | PRN

## 2017-09-16 MED ORDER — ESCITALOPRAM OXALATE 10 MG PO TABS: 10.0000 mg | ORAL_TABLET | Freq: Every day | ORAL | 0 refills | Status: DC

## 2017-09-16 MED ORDER — ARIPIPRAZOLE 20 MG PO TABS: 20.0000 mg | ORAL_TABLET | Freq: Every day | ORAL | 0 refills | Status: DC

## 2017-09-16 NOTE — [facility] Group Notes (Signed)
Adult Psychoeducational Group Note  Date:  09/16/2017 Time:  4:50 AM  Group Topic/Focus:  Wrap-Up Group:   The focus of this group is to help patients review their daily goal of treatment and discuss progress on daily workbooks.  Participation Level:  Active  Participation Quality:  Appropriate and Attentive  Affect:  Appropriate  Cognitive:  Alert and Appropriate  Insight: Appropriate and Good  Engagement in Group:  Engaged  Modes of Intervention:  Discussion and Education  Additional Comments:  Pt attended and participated in wrap up group. Pt rated their day an 8 because they had a family meeting and they have DSS involved. Because of this they may not have to go back to the group home that they hate. Pt goal was to have their family meeting. There were two positives noted by the pt and that was being able to shave and playing kickball, volleyball and dancing.   Brittany NettersOctavia A Omunique Palmer 09/16/2017, 4:50 AM

## 2017-09-16 NOTE — BHH Suicide Risk Assessment (Signed)
[facility] Discharge Suicide Risk Assessment   Principal Problem: MDD (major depressive disorder), severe [employer]) Discharge Diagnoses:  Patient Active Problem List   Diagnosis Date Noted  . MDD (major depressive disorder), severe (HCC) [F32.2] 09/09/2017  . Autism spectrum disorder [F84.0] 05/17/2017  . MDD (major depressive disorder), recurrent severe, without psychosis (HCC) [F33.2] 05/16/2017    Total Time spent with patient: 45 minutes  Musculoskeletal: Strength & Muscle Tone: within normal limits Gait & Station: normal Patient leans: N/A  Psychiatric Specialty Exam: Review of Systems  Constitutional: Negative.   Eyes: Negative.   Respiratory: Negative.   Cardiovascular: Negative.   Gastrointestinal: Negative.   Genitourinary: Negative.   Musculoskeletal: Negative.   Skin: Negative.   Neurological: Negative.   Endo/Heme/Allergies: Negative.   Psychiatric/Behavioral: Negative.     Blood pressure 121/66, pulse (!) 104, temperature 98.9 F (37.2 C), temperature source Oral, resp. rate 12, height 5\' 5"  (1.651 m), weight 73.9 kg (163 lb), SpO2 98 %.Body mass index is 27.12 kg/m.  General Appearance: Neatly dressed, pleasant, engaging well and cooperative. Appropriate behavior. Not in any distress. Good relatedness. Not internally stimulated  Eye Contact::  Good  Speech:  At baseline  Volume:  At baseline   Mood:  Euthymic  Affect:  Appropriate and Full Range  Thought Process:  Linear  Orientation:  Full (Time, Place, and Person)  Thought Content:  No delusional theme. No preoccupation with violent thoughts. No negative ruminations. No obsession.  No hallucination in any modality.   Suicidal Thoughts:  No  Homicidal Thoughts:  No  Memory:  Immediate;   Good Recent;   Fair Remote;   Good  Judgement:  Fair  Insight:  Good  Psychomotor Activity:  Normal  Concentration:  Good  Recall:  Good  Fund of Knowledge:Fair  Language: Fair  Akathisia:  Negative  Handed:    AIMS (if  indicated):     Assets:  Communication Skills Desire for Improvement Housing Physical Health Resilience  Sleep:  Number of Hours: 6.75  Cognition: WNL  ADL's:  Intact   Mental Status Per Nursing Assessment::   22 year old single female, group home resident, known to our unit from prior admissions, most recently from 12/18- 08/28/2017. At that time she was admitted due to making threats towards staff and peers at her Group Home, and making superficial scratches on her skin. She has a history of Autistic Spectrum Disorder, and has also been diagnosed with MDD.  Chart reviewed today. Patient discussed at team. Nursing staff reports that patient has been appropriate on the unit. Patient has been interacting well with peers. No behavioral issues. Patient has not voiced any suicidal thoughts. Patient has not been observed to be internally stimulated. Patient has been adherent with treatment recommendations. Patient has been tolerating their medication well.  Seen today. Reports that she is in good spirits. Not feeling depressed. Reports normal energy and interest. Has been maintaining normal biological functions. She is able to think clearly. She is able to focus on task. Her thoughts are not crowded or racing. No evidence of mania. No hallucination in any modality. She is not making any delusional statement. No passivity of will/thought. She is fully in touch with reality. No thoughts of suicide. No thoughts of homicide. No violent thoughts. No overwhelming anxiety. No acces to weapons.     Demographic Factors:  NA  Loss Factors: NA  Historical Factors: Impulsivity  Risk Reduction Factors:   Living with another person, especially a relative, Positive social  support, Positive therapeutic relationship and Positive coping skills or problem solving skills  Continued Clinical Symptoms:  As above   Cognitive Features That Contribute To Risk:  None    Suicide Risk:  Minimal: No identifiable  suicidal ideation.  Patient is not having any thoughts of suicide at this time. Modifiable risk factors targeted during this admission includes mood disorder and impulsivity. . Demographical and historical risk factors cannot be modified. Patient is now engaging well. Patient is reliable and is future oriented. We have buffered patient's support structures. At this point, patient is at low risk of suicide. Patient is aware of the effects of psychoactive substances on decision making process. Patient has been provided with emergency contacts. Patient acknowledges to use resources provided if unforseen circumstances changes their current risk stratification.    Follow-up Information    [facility] Go on [facility]2017.   Why:  Please attend your discharge appointment on Thursday, 09/18/17, 10:30am. Contact information: [address] [place] [address] [phone]           Plan Of Care/Follow-up recommendations:  1. Continue current psychotropic medications 2. Mental health and addiction follow up as arranged.  [facility]   Georgiann CockerVincent A Izediuno, MD 09/16/2017, 1:44 PM
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
## 2017-09-16 NOTE — B[facility] Group Notes (Signed)
[facility] LCSW Group Therapy Note  Date/Time: 09/16/17, 1315  Type of Therapy/Topic:  Group Therapy:  Feelings about Diagnosis  Participation Level:  Active   Mood: active   Description of Group:    This group will allow patients to explore their thoughts and feelings about diagnoses they have received. Patients will be guided to explore their level of understanding and acceptance of these diagnoses. Facilitator will encourage patients to process their thoughts and feelings about the reactions of others to their diagnosis, and will guide patients in identifying ways to discuss their diagnosis with significant others in their lives. This group will be process-oriented, with patients participating in exploration of their own experiences as well as giving and receiving support and challenge from other group members.   Therapeutic Goals: 1. Patient will demonstrate understanding of diagnosis as evidence by identifying two or more symptoms of the disorder:  2. Patient will be able to express two feelings regarding the diagnosis 3. Patient will demonstrate ability to communicate their needs through discussion and/or role plays  Summary of Patient Progress:Pt was able to identify symptoms of her diagnosis.  Pt was attentive in group, shared regarding current symptoms, and made contributions to the group discussion regarding diagnosis.          Therapeutic Modalities:   Cognitive Behavioral Therapy Brief Therapy Feelings Identification   Daleen SquibbGreg Randye Treichler, LCSW

## 2017-09-16 NOTE — Progress Notes (Signed)
  [facility] :  Will you be returning to the same living situation after discharge:  Yes,  girl's home: [facility] At discharge, do you have transportation home?: Yes,  girl's home Do you have the ability to pay for your medications: Yes,  BCBS  Release of information consent forms completed and in the chart;  Patient's signature needed at discharge.  Patient to Follow up at: Follow-up Information    [facility]. Go on 09/18/2017.   Why:  Please attend your discharge appointment on Thursday, 09/18/17, 10:30am. Contact information: [address] [place] [address] [phone]           Next level of care provider has access to [facility] Link:no  Safety Planning and Suicide Prevention discussed: Yes,  with mother/legal guardian  Have you used any form of tobacco in the last 30 days? (Cigarettes, Smokeless Tobacco, Cigars, and/or Pipes): No  Has patient been referred to the Quitline?: N/A patient is not a smoker  Patient has been referred for addiction treatment: Yes  Lorri FrederickWierda, Jiyaan Steinhauser Jon, LCSW 09/16/2017, 9:59 AM

## 2017-09-16 NOTE — Progress Notes (Addendum)
CSW spoke to Marion DownerWendy Emerson, DSS adult services supervisor at [facility] [place] [facility].  [phone].  In order to change legal guardian, the mother would need to file for another court hearing requesting this.  Can look up forms herself [place] or can go to clerk of court for help.  Sharee PimpleJudge will ask for other family members to step in and if there is no one else, DSS would get involved. Garner NashGregory Jasenia Weilbacher, MSW, LCSW Clinical Social Worker 09/16/2017 9:34 AM   CSW spoke to pt, [name], who agreed that she would [facility] to [facility] [facility] and wait for the court hearing if her mom will initiate things.  CSW spoke to Althea Charonana Ulbrich, mother/legal guardian, (cell [phone]).  She agrees that she will initiate the court proceedings this week and will communicate the date to [name] so that she knows how long it will be.  Annabelle HarmanDana said that pt is eligible for medicaid but something about the fact that the [place] program is unlicensed led to her not having active medicaid currently.  CSW suggested that she look into applying for medicaid as other placements would require it.   Garner NashGregory Ndea Kilroy, MSW, LCSW Clinical Social Worker 09/16/2017 9:48 AM   CSW received [facility] saying that the director was now unwilling to take pt back as he is concerned she will cause more problems, particularly for other residents.  CSW was able to speak to Arman BogusJimmy Clark, director, [phone], who agreed that he would allow pt back while mom works on the change of guardianship.

## 2017-09-16 NOTE — Progress Notes (Signed)
Patient discharged to lobby. Patient was stable and appreciative at that time. All papers and prescriptions were given and valuables returned. Verbal understanding expressed. Denies SI/HI and A/VH. Patient given opportunity to express concerns and ask questions.  

## 2017-09-18 DIAGNOSIS — F329 Major depressive disorder, single episode, unspecified: Secondary | ICD-10-CM | POA: Diagnosis not present

## 2017-09-30 DIAGNOSIS — Z0000 Encounter for general adult medical examination without abnormal findings: Secondary | ICD-10-CM | POA: Diagnosis not present

## 2017-09-30 DIAGNOSIS — Z6827 Body mass index (BMI) 27.0-27.9, adult: Secondary | ICD-10-CM | POA: Diagnosis not present

## 2017-09-30 DIAGNOSIS — I10 Essential (primary) hypertension: Secondary | ICD-10-CM | POA: Diagnosis not present

## 2017-10-07 DIAGNOSIS — F329 Major depressive disorder, single episode, unspecified: Secondary | ICD-10-CM | POA: Diagnosis not present

## 2017-10-15 DIAGNOSIS — I10 Essential (primary) hypertension: Secondary | ICD-10-CM | POA: Diagnosis not present

## 2017-10-15 DIAGNOSIS — Z6827 Body mass index (BMI) 27.0-27.9, adult: Secondary | ICD-10-CM | POA: Diagnosis not present

## 2017-10-22 DIAGNOSIS — K219 Gastro-esophageal reflux disease without esophagitis: Secondary | ICD-10-CM | POA: Diagnosis not present

## 2017-10-22 DIAGNOSIS — I10 Essential (primary) hypertension: Secondary | ICD-10-CM | POA: Diagnosis not present

## 2017-10-22 DIAGNOSIS — Z6827 Body mass index (BMI) 27.0-27.9, adult: Secondary | ICD-10-CM | POA: Diagnosis not present

## 2017-10-30 DIAGNOSIS — Z6826 Body mass index (BMI) 26.0-26.9, adult: Secondary | ICD-10-CM | POA: Diagnosis not present

## 2017-10-30 DIAGNOSIS — I340 Nonrheumatic mitral (valve) insufficiency: Secondary | ICD-10-CM | POA: Diagnosis not present

## 2017-10-30 DIAGNOSIS — I10 Essential (primary) hypertension: Secondary | ICD-10-CM | POA: Diagnosis not present

## 2017-10-30 DIAGNOSIS — I482 Chronic atrial fibrillation: Secondary | ICD-10-CM | POA: Diagnosis not present

## 2017-11-08 ENCOUNTER — Other Ambulatory Visit (HOSPITAL_COMMUNITY): Payer: Self-pay | Admitting: Family

## 2017-11-11 DIAGNOSIS — E860 Dehydration: Secondary | ICD-10-CM | POA: Diagnosis not present

## 2017-11-11 DIAGNOSIS — Z6827 Body mass index (BMI) 27.0-27.9, adult: Secondary | ICD-10-CM | POA: Diagnosis not present

## 2017-11-11 DIAGNOSIS — I10 Essential (primary) hypertension: Secondary | ICD-10-CM | POA: Diagnosis not present

## 2017-11-18 DIAGNOSIS — F329 Major depressive disorder, single episode, unspecified: Secondary | ICD-10-CM | POA: Diagnosis not present

## 2017-12-16 DIAGNOSIS — R062 Wheezing: Secondary | ICD-10-CM | POA: Diagnosis not present

## 2017-12-16 DIAGNOSIS — R0602 Shortness of breath: Secondary | ICD-10-CM | POA: Diagnosis not present

## 2017-12-16 DIAGNOSIS — Z6827 Body mass index (BMI) 27.0-27.9, adult: Secondary | ICD-10-CM | POA: Diagnosis not present

## 2017-12-16 DIAGNOSIS — J988 Other specified respiratory disorders: Secondary | ICD-10-CM | POA: Diagnosis not present

## 2018-01-05 DIAGNOSIS — F840 Autistic disorder: Secondary | ICD-10-CM | POA: Diagnosis not present

## 2018-01-05 DIAGNOSIS — R42 Dizziness and giddiness: Secondary | ICD-10-CM | POA: Diagnosis not present

## 2018-01-05 DIAGNOSIS — F89 Unspecified disorder of psychological development: Secondary | ICD-10-CM | POA: Diagnosis not present

## 2018-01-05 DIAGNOSIS — R45851 Suicidal ideations: Secondary | ICD-10-CM | POA: Diagnosis not present

## 2018-01-05 DIAGNOSIS — F332 Major depressive disorder, recurrent severe without psychotic features: Secondary | ICD-10-CM | POA: Diagnosis not present

## 2018-01-06 DIAGNOSIS — R358 Other polyuria: Secondary | ICD-10-CM | POA: Diagnosis not present

## 2018-01-06 DIAGNOSIS — R45851 Suicidal ideations: Secondary | ICD-10-CM | POA: Diagnosis not present

## 2018-01-06 DIAGNOSIS — I10 Essential (primary) hypertension: Secondary | ICD-10-CM | POA: Diagnosis not present

## 2018-01-06 DIAGNOSIS — L559 Sunburn, unspecified: Secondary | ICD-10-CM | POA: Diagnosis not present

## 2018-01-06 DIAGNOSIS — F840 Autistic disorder: Secondary | ICD-10-CM | POA: Diagnosis not present

## 2018-01-06 DIAGNOSIS — K219 Gastro-esophageal reflux disease without esophagitis: Secondary | ICD-10-CM | POA: Diagnosis not present

## 2018-01-06 DIAGNOSIS — Z915 Personal history of self-harm: Secondary | ICD-10-CM | POA: Diagnosis not present

## 2018-01-06 DIAGNOSIS — F332 Major depressive disorder, recurrent severe without psychotic features: Secondary | ICD-10-CM | POA: Diagnosis not present

## 2018-01-06 DIAGNOSIS — R42 Dizziness and giddiness: Secondary | ICD-10-CM | POA: Diagnosis not present

## 2018-01-07 DIAGNOSIS — F332 Major depressive disorder, recurrent severe without psychotic features: Secondary | ICD-10-CM | POA: Diagnosis not present

## 2018-01-07 DIAGNOSIS — F840 Autistic disorder: Secondary | ICD-10-CM | POA: Diagnosis not present

## 2018-01-08 DIAGNOSIS — F840 Autistic disorder: Secondary | ICD-10-CM | POA: Diagnosis not present

## 2018-01-08 DIAGNOSIS — F332 Major depressive disorder, recurrent severe without psychotic features: Secondary | ICD-10-CM | POA: Diagnosis not present

## 2018-01-09 DIAGNOSIS — F332 Major depressive disorder, recurrent severe without psychotic features: Secondary | ICD-10-CM | POA: Diagnosis not present

## 2018-01-09 DIAGNOSIS — F840 Autistic disorder: Secondary | ICD-10-CM | POA: Diagnosis not present

## 2018-01-10 DIAGNOSIS — F332 Major depressive disorder, recurrent severe without psychotic features: Secondary | ICD-10-CM | POA: Diagnosis not present

## 2018-01-10 DIAGNOSIS — F840 Autistic disorder: Secondary | ICD-10-CM | POA: Diagnosis not present

## 2018-01-11 DIAGNOSIS — F840 Autistic disorder: Secondary | ICD-10-CM | POA: Diagnosis not present

## 2018-01-12 DIAGNOSIS — F840 Autistic disorder: Secondary | ICD-10-CM | POA: Diagnosis not present

## 2018-01-12 DIAGNOSIS — F332 Major depressive disorder, recurrent severe without psychotic features: Secondary | ICD-10-CM | POA: Diagnosis not present

## 2018-01-13 DIAGNOSIS — F332 Major depressive disorder, recurrent severe without psychotic features: Secondary | ICD-10-CM | POA: Diagnosis not present

## 2018-01-13 DIAGNOSIS — F840 Autistic disorder: Secondary | ICD-10-CM | POA: Diagnosis not present

## 2018-01-14 DIAGNOSIS — F332 Major depressive disorder, recurrent severe without psychotic features: Secondary | ICD-10-CM | POA: Diagnosis not present

## 2018-01-14 DIAGNOSIS — F840 Autistic disorder: Secondary | ICD-10-CM | POA: Diagnosis not present

## 2018-01-14 DIAGNOSIS — L559 Sunburn, unspecified: Secondary | ICD-10-CM | POA: Diagnosis not present

## 2018-01-15 DIAGNOSIS — F332 Major depressive disorder, recurrent severe without psychotic features: Secondary | ICD-10-CM | POA: Diagnosis not present

## 2018-01-15 DIAGNOSIS — F840 Autistic disorder: Secondary | ICD-10-CM | POA: Diagnosis not present

## 2018-01-16 DIAGNOSIS — F332 Major depressive disorder, recurrent severe without psychotic features: Secondary | ICD-10-CM | POA: Diagnosis not present

## 2018-01-16 DIAGNOSIS — F840 Autistic disorder: Secondary | ICD-10-CM | POA: Diagnosis not present

## 2018-01-17 DIAGNOSIS — F840 Autistic disorder: Secondary | ICD-10-CM | POA: Diagnosis not present

## 2018-01-17 DIAGNOSIS — F332 Major depressive disorder, recurrent severe without psychotic features: Secondary | ICD-10-CM | POA: Diagnosis not present

## 2018-01-19 DIAGNOSIS — F332 Major depressive disorder, recurrent severe without psychotic features: Secondary | ICD-10-CM | POA: Diagnosis not present

## 2018-01-19 DIAGNOSIS — F840 Autistic disorder: Secondary | ICD-10-CM | POA: Diagnosis not present

## 2018-01-20 DIAGNOSIS — F332 Major depressive disorder, recurrent severe without psychotic features: Secondary | ICD-10-CM | POA: Diagnosis not present

## 2018-01-20 DIAGNOSIS — F840 Autistic disorder: Secondary | ICD-10-CM | POA: Diagnosis not present

## 2018-02-02 DIAGNOSIS — Z79899 Other long term (current) drug therapy: Secondary | ICD-10-CM | POA: Diagnosis not present

## 2018-02-02 DIAGNOSIS — Z6827 Body mass index (BMI) 27.0-27.9, adult: Secondary | ICD-10-CM | POA: Diagnosis not present

## 2018-02-02 DIAGNOSIS — Z9189 Other specified personal risk factors, not elsewhere classified: Secondary | ICD-10-CM | POA: Diagnosis not present

## 2018-02-02 DIAGNOSIS — F3181 Bipolar II disorder: Secondary | ICD-10-CM | POA: Diagnosis not present

## 2018-02-03 DIAGNOSIS — Z79899 Other long term (current) drug therapy: Secondary | ICD-10-CM | POA: Diagnosis not present

## 2018-02-16 DIAGNOSIS — Z6826 Body mass index (BMI) 26.0-26.9, adult: Secondary | ICD-10-CM | POA: Diagnosis not present

## 2018-02-16 DIAGNOSIS — E162 Hypoglycemia, unspecified: Secondary | ICD-10-CM | POA: Diagnosis not present

## 2018-02-16 DIAGNOSIS — F3181 Bipolar II disorder: Secondary | ICD-10-CM | POA: Diagnosis not present

## 2018-02-23 DIAGNOSIS — E161 Other hypoglycemia: Secondary | ICD-10-CM | POA: Diagnosis not present

## 2018-02-23 DIAGNOSIS — Z6826 Body mass index (BMI) 26.0-26.9, adult: Secondary | ICD-10-CM | POA: Diagnosis not present

## 2018-02-23 DIAGNOSIS — E663 Overweight: Secondary | ICD-10-CM | POA: Diagnosis not present

## 2018-03-09 DIAGNOSIS — I959 Hypotension, unspecified: Secondary | ICD-10-CM | POA: Diagnosis not present

## 2018-03-09 DIAGNOSIS — Z6826 Body mass index (BMI) 26.0-26.9, adult: Secondary | ICD-10-CM | POA: Diagnosis not present

## 2018-03-09 DIAGNOSIS — K449 Diaphragmatic hernia without obstruction or gangrene: Secondary | ICD-10-CM | POA: Diagnosis not present

## 2018-03-09 DIAGNOSIS — E161 Other hypoglycemia: Secondary | ICD-10-CM | POA: Diagnosis not present

## 2018-03-16 DIAGNOSIS — E161 Other hypoglycemia: Secondary | ICD-10-CM | POA: Diagnosis not present

## 2018-03-16 DIAGNOSIS — H547 Unspecified visual loss: Secondary | ICD-10-CM | POA: Diagnosis not present

## 2018-03-16 DIAGNOSIS — F3181 Bipolar II disorder: Secondary | ICD-10-CM | POA: Diagnosis not present

## 2018-03-16 DIAGNOSIS — I10 Essential (primary) hypertension: Secondary | ICD-10-CM | POA: Diagnosis not present

## 2018-03-22 DIAGNOSIS — R197 Diarrhea, unspecified: Secondary | ICD-10-CM | POA: Diagnosis not present

## 2018-03-22 DIAGNOSIS — R42 Dizziness and giddiness: Secondary | ICD-10-CM | POA: Diagnosis not present

## 2018-03-22 DIAGNOSIS — F840 Autistic disorder: Secondary | ICD-10-CM | POA: Diagnosis not present

## 2018-03-22 DIAGNOSIS — F89 Unspecified disorder of psychological development: Secondary | ICD-10-CM | POA: Diagnosis not present

## 2018-03-22 DIAGNOSIS — Z79899 Other long term (current) drug therapy: Secondary | ICD-10-CM | POA: Diagnosis not present

## 2018-03-22 DIAGNOSIS — Z888 Allergy status to other drugs, medicaments and biological substances status: Secondary | ICD-10-CM | POA: Diagnosis not present

## 2018-03-22 DIAGNOSIS — R1110 Vomiting, unspecified: Secondary | ICD-10-CM | POA: Diagnosis not present

## 2018-03-22 DIAGNOSIS — I499 Cardiac arrhythmia, unspecified: Secondary | ICD-10-CM | POA: Diagnosis not present

## 2018-03-22 DIAGNOSIS — F332 Major depressive disorder, recurrent severe without psychotic features: Secondary | ICD-10-CM | POA: Diagnosis not present

## 2018-03-22 DIAGNOSIS — F329 Major depressive disorder, single episode, unspecified: Secondary | ICD-10-CM | POA: Diagnosis not present

## 2018-03-22 DIAGNOSIS — R45851 Suicidal ideations: Secondary | ICD-10-CM | POA: Diagnosis not present

## 2018-03-23 DIAGNOSIS — F332 Major depressive disorder, recurrent severe without psychotic features: Secondary | ICD-10-CM | POA: Diagnosis not present

## 2018-03-23 DIAGNOSIS — R45851 Suicidal ideations: Secondary | ICD-10-CM | POA: Diagnosis not present

## 2018-03-23 DIAGNOSIS — F89 Unspecified disorder of psychological development: Secondary | ICD-10-CM | POA: Diagnosis not present

## 2018-03-25 DIAGNOSIS — Z915 Personal history of self-harm: Secondary | ICD-10-CM | POA: Diagnosis not present

## 2018-03-25 DIAGNOSIS — F314 Bipolar disorder, current episode depressed, severe, without psychotic features: Secondary | ICD-10-CM | POA: Diagnosis not present

## 2018-03-25 DIAGNOSIS — F315 Bipolar disorder, current episode depressed, severe, with psychotic features: Secondary | ICD-10-CM | POA: Diagnosis not present

## 2018-03-25 DIAGNOSIS — R45851 Suicidal ideations: Secondary | ICD-10-CM | POA: Diagnosis not present

## 2018-03-25 DIAGNOSIS — K219 Gastro-esophageal reflux disease without esophagitis: Secondary | ICD-10-CM | POA: Diagnosis not present

## 2018-03-25 DIAGNOSIS — F840 Autistic disorder: Secondary | ICD-10-CM | POA: Diagnosis not present

## 2018-03-25 DIAGNOSIS — I10 Essential (primary) hypertension: Secondary | ICD-10-CM | POA: Diagnosis not present

## 2018-03-25 DIAGNOSIS — J45909 Unspecified asthma, uncomplicated: Secondary | ICD-10-CM | POA: Diagnosis not present

## 2018-03-26 DIAGNOSIS — F315 Bipolar disorder, current episode depressed, severe, with psychotic features: Secondary | ICD-10-CM | POA: Diagnosis not present

## 2018-03-26 DIAGNOSIS — F840 Autistic disorder: Secondary | ICD-10-CM | POA: Diagnosis not present

## 2018-03-27 DIAGNOSIS — F840 Autistic disorder: Secondary | ICD-10-CM | POA: Diagnosis not present

## 2018-03-27 DIAGNOSIS — F315 Bipolar disorder, current episode depressed, severe, with psychotic features: Secondary | ICD-10-CM | POA: Diagnosis not present

## 2018-03-28 DIAGNOSIS — F315 Bipolar disorder, current episode depressed, severe, with psychotic features: Secondary | ICD-10-CM | POA: Diagnosis not present

## 2018-03-28 DIAGNOSIS — F840 Autistic disorder: Secondary | ICD-10-CM | POA: Diagnosis not present

## 2018-03-29 DIAGNOSIS — F840 Autistic disorder: Secondary | ICD-10-CM | POA: Diagnosis not present

## 2018-03-29 DIAGNOSIS — F315 Bipolar disorder, current episode depressed, severe, with psychotic features: Secondary | ICD-10-CM | POA: Diagnosis not present

## 2018-03-30 DIAGNOSIS — F315 Bipolar disorder, current episode depressed, severe, with psychotic features: Secondary | ICD-10-CM | POA: Diagnosis not present

## 2018-03-30 DIAGNOSIS — F840 Autistic disorder: Secondary | ICD-10-CM | POA: Diagnosis not present

## 2018-03-31 DIAGNOSIS — F315 Bipolar disorder, current episode depressed, severe, with psychotic features: Secondary | ICD-10-CM | POA: Diagnosis not present

## 2018-03-31 DIAGNOSIS — F840 Autistic disorder: Secondary | ICD-10-CM | POA: Diagnosis not present

## 2018-04-01 DIAGNOSIS — F840 Autistic disorder: Secondary | ICD-10-CM | POA: Diagnosis not present

## 2018-04-01 DIAGNOSIS — F315 Bipolar disorder, current episode depressed, severe, with psychotic features: Secondary | ICD-10-CM | POA: Diagnosis not present

## 2018-04-02 DIAGNOSIS — F315 Bipolar disorder, current episode depressed, severe, with psychotic features: Secondary | ICD-10-CM | POA: Diagnosis not present

## 2018-04-02 DIAGNOSIS — F840 Autistic disorder: Secondary | ICD-10-CM | POA: Diagnosis not present

## 2018-04-03 DIAGNOSIS — F315 Bipolar disorder, current episode depressed, severe, with psychotic features: Secondary | ICD-10-CM | POA: Diagnosis not present

## 2018-04-03 DIAGNOSIS — F840 Autistic disorder: Secondary | ICD-10-CM | POA: Diagnosis not present

## 2018-04-04 DIAGNOSIS — F840 Autistic disorder: Secondary | ICD-10-CM | POA: Diagnosis not present

## 2018-04-04 DIAGNOSIS — F315 Bipolar disorder, current episode depressed, severe, with psychotic features: Secondary | ICD-10-CM | POA: Diagnosis not present

## 2018-04-05 DIAGNOSIS — F315 Bipolar disorder, current episode depressed, severe, with psychotic features: Secondary | ICD-10-CM | POA: Diagnosis not present

## 2018-04-05 DIAGNOSIS — F840 Autistic disorder: Secondary | ICD-10-CM | POA: Diagnosis not present

## 2018-04-06 DIAGNOSIS — F315 Bipolar disorder, current episode depressed, severe, with psychotic features: Secondary | ICD-10-CM | POA: Diagnosis not present

## 2018-04-06 DIAGNOSIS — F840 Autistic disorder: Secondary | ICD-10-CM | POA: Diagnosis not present

## 2018-04-07 DIAGNOSIS — F315 Bipolar disorder, current episode depressed, severe, with psychotic features: Secondary | ICD-10-CM | POA: Diagnosis not present

## 2018-04-07 DIAGNOSIS — F840 Autistic disorder: Secondary | ICD-10-CM | POA: Diagnosis not present

## 2018-04-08 DIAGNOSIS — F332 Major depressive disorder, recurrent severe without psychotic features: Secondary | ICD-10-CM | POA: Diagnosis not present

## 2018-04-22 DIAGNOSIS — F332 Major depressive disorder, recurrent severe without psychotic features: Secondary | ICD-10-CM | POA: Diagnosis not present

## 2018-04-28 DIAGNOSIS — Z139 Encounter for screening, unspecified: Secondary | ICD-10-CM | POA: Diagnosis not present

## 2018-04-28 DIAGNOSIS — Z0000 Encounter for general adult medical examination without abnormal findings: Secondary | ICD-10-CM | POA: Diagnosis not present

## 2018-04-28 DIAGNOSIS — J069 Acute upper respiratory infection, unspecified: Secondary | ICD-10-CM | POA: Diagnosis not present

## 2018-04-28 DIAGNOSIS — Z1331 Encounter for screening for depression: Secondary | ICD-10-CM | POA: Diagnosis not present

## 2018-04-28 DIAGNOSIS — Z79899 Other long term (current) drug therapy: Secondary | ICD-10-CM | POA: Diagnosis not present

## 2018-05-05 DIAGNOSIS — I482 Chronic atrial fibrillation: Secondary | ICD-10-CM | POA: Diagnosis not present

## 2018-05-05 DIAGNOSIS — F3181 Bipolar II disorder: Secondary | ICD-10-CM | POA: Diagnosis not present

## 2018-05-05 DIAGNOSIS — Z23 Encounter for immunization: Secondary | ICD-10-CM | POA: Diagnosis not present

## 2018-05-05 DIAGNOSIS — E663 Overweight: Secondary | ICD-10-CM | POA: Diagnosis not present

## 2018-05-05 DIAGNOSIS — K921 Melena: Secondary | ICD-10-CM | POA: Diagnosis not present

## 2018-05-06 DIAGNOSIS — F319 Bipolar disorder, unspecified: Secondary | ICD-10-CM | POA: Diagnosis not present

## 2018-05-06 DIAGNOSIS — F332 Major depressive disorder, recurrent severe without psychotic features: Secondary | ICD-10-CM | POA: Diagnosis not present

## 2018-05-07 DIAGNOSIS — F332 Major depressive disorder, recurrent severe without psychotic features: Secondary | ICD-10-CM | POA: Diagnosis not present

## 2018-05-13 ENCOUNTER — Encounter: Payer: Self-pay | Admitting: Emergency Medicine

## 2018-05-13 ENCOUNTER — Emergency Department
Admission: EM | Admit: 2018-05-13 | Discharge: 2018-05-19 | Disposition: A | Discharge status: Other Acute Inpatient Hospital | Payer: BLUE CROSS/BLUE SHIELD | Attending: Student in an Organized Health Care Education/Training Program | Admitting: Student in an Organized Health Care Education/Training Program

## 2018-05-13 DIAGNOSIS — F332 Major depressive disorder, recurrent severe without psychotic features: Secondary | ICD-10-CM | POA: Diagnosis not present

## 2018-05-13 DIAGNOSIS — Z79899 Other long term (current) drug therapy: Secondary | ICD-10-CM | POA: Insufficient documentation

## 2018-05-13 DIAGNOSIS — R454 Irritability and anger: Secondary | ICD-10-CM | POA: Insufficient documentation

## 2018-05-13 DIAGNOSIS — F329 Major depressive disorder, single episode, unspecified: Secondary | ICD-10-CM | POA: Insufficient documentation

## 2018-05-13 DIAGNOSIS — R45850 Homicidal ideations: Secondary | ICD-10-CM | POA: Diagnosis not present

## 2018-05-13 DIAGNOSIS — R45851 Suicidal ideations: Secondary | ICD-10-CM

## 2018-05-13 DIAGNOSIS — R440 Auditory hallucinations: Secondary | ICD-10-CM | POA: Diagnosis not present

## 2018-05-13 DIAGNOSIS — F840 Autistic disorder: Secondary | ICD-10-CM | POA: Insufficient documentation

## 2018-05-13 DIAGNOSIS — Z046 Encounter for general psychiatric examination, requested by authority: Secondary | ICD-10-CM | POA: Diagnosis not present

## 2018-05-13 DIAGNOSIS — Z7289 Other problems related to lifestyle: Secondary | ICD-10-CM | POA: Insufficient documentation

## 2018-05-13 DIAGNOSIS — I10 Essential (primary) hypertension: Secondary | ICD-10-CM | POA: Diagnosis not present

## 2018-05-13 LAB — COMPREHENSIVE METABOLIC PANEL
ALT: 13 U/L (ref 0–44)
ANION GAP: 7 (ref 5–15)
AST: 16 U/L (ref 15–41)
Albumin: 4.9 g/dL (ref 3.5–5.0)
Alkaline Phosphatase: 61 U/L (ref 38–126)
BUN: 10 mg/dL (ref 6–20)
CHLORIDE: 107 mmol/L (ref 98–111)
CO2: 27 mmol/L (ref 22–32)
Calcium: 9.2 mg/dL (ref 8.9–10.3)
Creatinine, Ser: 0.9 mg/dL (ref 0.44–1.00)
GFR calc non Af Amer: >60 mL/min (ref >60)
Glucose, Bld: 95 mg/dL (ref 70–99)
POTASSIUM: 3.4 mmol/L — AB (ref 3.5–5.1)
SODIUM: 141 mmol/L (ref 135–145)
TOTAL PROTEIN: 7.8 g/dL (ref 6.5–8.1)
Total Bilirubin: 0.4 mg/dL (ref 0.3–1.2)

## 2018-05-13 LAB — URINE DRUG SCREEN, QUALITATIVE (ARMC ONLY)
Amphetamines, Ur Screen: NOT DETECTED
BENZODIAZEPINE, UR SCRN: NOT DETECTED
Barbiturates, Ur Screen: NOT DETECTED
Cannabinoid 50 Ng, Ur ~~LOC~~: NOT DETECTED
Cocaine Metabolite,Ur ~~LOC~~: NOT DETECTED
MDMA (Ecstasy)Ur Screen: NOT DETECTED
METHADONE SCREEN, URINE: NOT DETECTED
Opiate, Ur Screen: NOT DETECTED
Phencyclidine (PCP) Ur S: NOT DETECTED
TRICYCLIC, UR SCREEN: NOT DETECTED

## 2018-05-13 LAB — CBC
HCT: 40.9 % (ref 35.0–47.0)
Hemoglobin: 14 g/dL (ref 12.0–16.0)
MCH: 29.9 pg (ref 26.0–34.0)
MCHC: 34.2 g/dL (ref 32.0–36.0)
MCV: 87.3 fL (ref 80.0–100.0)
PLATELETS: 321 10*3/uL (ref 150–440)
RBC: 4.69 MIL/uL (ref 3.80–5.20)
RDW: 13.1 % (ref 11.5–14.5)
WBC: 10.7 10*3/uL (ref 3.6–11.0)

## 2018-05-13 LAB — ACETAMINOPHEN LEVEL

## 2018-05-13 LAB — ETHANOL

## 2018-05-13 LAB — POCT PREGNANCY, URINE: PREG TEST UR: NEGATIVE

## 2018-05-13 LAB — SALICYLATE LEVEL: Salicylate Lvl: <7 mg/dL (ref 2.8–30.0)
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
## 2018-05-13 NOTE — ED Notes (Signed)
Pt given meal tray with drink. Pt also given warm blanket. Pt in bed eating food. No other needs voiced at this time. Will continue to monitor Q15 min rounds.
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
## 2018-05-13 NOTE — BH Assessment (Signed)
Writer called Pt group home [facility]) at [phone] and pt's mother Brittany Palmer) at [phone]. Writer left hippa compliant voicemail for group home and was unable to leave vm for pt's mother due to vm not being set up
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
## 2018-05-14 DIAGNOSIS — F332 Major depressive disorder, recurrent severe without psychotic features: Secondary | ICD-10-CM

## 2018-05-14 DIAGNOSIS — F329 Major depressive disorder, single episode, unspecified: Secondary | ICD-10-CM | POA: Diagnosis not present

## 2018-05-14 MED ORDER — LOSARTAN POTASSIUM 25 MG PO TABS
25.0000 mg | ORAL_TABLET | Freq: Every day | ORAL | Status: DC
  Administered 2018-05-15 – 2018-05-18 (×4): 25 mg via ORAL
  Filled 2018-05-14 (×5): qty 1

## 2018-05-14 MED ORDER — LORAZEPAM 1 MG PO TABS
1.0000 mg | ORAL_TABLET | Freq: Once | ORAL | Status: AC
  Administered 2018-05-14: 1 mg via ORAL
  Filled 2018-05-14: qty 1

## 2018-05-14 MED ORDER — PANTOPRAZOLE SODIUM 40 MG PO TBEC
40.0000 mg | DELAYED_RELEASE_TABLET | Freq: Every day | ORAL | Status: DC
  Administered 2018-05-14 – 2018-05-18 (×5): 40 mg via ORAL
  Filled 2018-05-14 (×5): qty 1

## 2018-05-14 MED ORDER — TRAZODONE HCL 100 MG PO TABS
100.0000 mg | ORAL_TABLET | Freq: Every day | ORAL | Status: DC
  Administered 2018-05-14 – 2018-05-16 (×3): 100 mg via ORAL
  Filled 2018-05-14 (×3): qty 1

## 2018-05-14 MED ORDER — ZIPRASIDONE MESYLATE 20 MG IM SOLR
20.0000 mg | Freq: Once | INTRAMUSCULAR | Status: AC
  Administered 2018-05-14: 20 mg via INTRAMUSCULAR
  Filled 2018-05-14: qty 20

## 2018-05-14 MED ORDER — ALBUTEROL SULFATE HFA 108 (90 BASE) MCG/ACT IN AERS
2.0000 | INHALATION_SPRAY | Freq: Four times a day (QID) | RESPIRATORY_TRACT | Status: DC | PRN
  Administered 2018-05-16 – 2018-05-18 (×5): 2 via RESPIRATORY_TRACT
  Filled 2018-05-14: qty 6.7

## 2018-05-14 MED ORDER — HYDROCHLOROTHIAZIDE 12.5 MG PO CAPS
12.5000 mg | ORAL_CAPSULE | Freq: Every day | ORAL | Status: DC
  Administered 2018-05-15 – 2018-05-18 (×4): 12.5 mg via ORAL
  Filled 2018-05-14 (×5): qty 1

## 2018-05-14 MED ORDER — ARIPIPRAZOLE 10 MG PO TABS
20.0000 mg | ORAL_TABLET | Freq: Every day | ORAL | Status: DC
  Administered 2018-05-14 – 2018-05-15 (×2): 20 mg via ORAL
  Filled 2018-05-14 (×2): qty 2

## 2018-05-14 MED ORDER — ESCITALOPRAM OXALATE 10 MG PO TABS
10.0000 mg | ORAL_TABLET | Freq: Every day | ORAL | Status: DC
  Administered 2018-05-14 – 2018-05-18 (×5): 10 mg via ORAL
  Filled 2018-05-14 (×5): qty 1

## 2018-05-14 MED ORDER — HALOPERIDOL 5 MG PO TABS
5.0000 mg | ORAL_TABLET | Freq: Once | ORAL | Status: AC
  Administered 2018-05-14: 5 mg via ORAL
  Filled 2018-05-14: qty 1

## 2018-05-14 NOTE — ED Notes (Signed)
Pt yelling and cursing loudly continuously.  Unable to redirect.  Pt yelling, "I want to kill myself!" Pt banging head on wall and punching the wall with her fist.  ER doctor, Dr. Sharma CovertNorman, notified.  Order received for Haldol 5 mg PO given.  Psychiatrist, Dr. Toni Amendlapacs, contacted and order received for Ziprasidone 20 mg IM.  IM injection given.  Pt then calm.  Took IM injection without difficulty, stated, "Thank you."  I can't turn the voices off inside of my head.
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
## 2018-05-14 NOTE — ED Notes (Signed)
Patient talking with Dr.Clapaas at this time.

## 2018-05-14 NOTE — ED Notes (Addendum)
Refused snack. 
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
## 2018-05-14 NOTE — ED Notes (Signed)
Report to include Situation, Background, Assessment, and Recommendations received from Amy B. RN. Patient alert and oriented, warm and dry, in no acute distress. Patient deniesVH and pain. Patient states she still has SI without plan and HI towards group home staff and is also hearing voices talking without command. Patient made aware of Q15 minute rounds and security cameras for their safety. Patient instructed to come to me with needs or concerns.

## 2018-05-14 NOTE — ED Notes (Signed)
Pt currently lying in bed; not yelling out or cursing.  Pt is alert, calm, and cooperative.  Is not verbally aggressive at this time.

## 2018-05-14 NOTE — ED Notes (Signed)
Pt in her room yelling and cursing. Pt appears to be responding to internal stimuli. EDP made aware.  PO medication given as ordered.   Maintained on 15 minute checks and observation by security camera for safety.

## 2018-05-14 NOTE — ED Provider Notes (Signed)
Vitals:   05/13/18 2047  BP: 124/79  Pulse: 81  Resp: 18  Temp: 98.6 F (37 C)  SpO2: 99%   I rev this morning, patient still awaiting ride.  She was apparently discharged overnight by Dr. Lamont Snowballifenbark.   Governor RooksLord, Treon Kehl, MD 05/14/18 [phone]

## 2018-05-14 NOTE — ED Notes (Signed)
Hourly rounding reveals patient sleeping in room. No complaints, stable, in no acute distress. Q15 minute rounds and monitoring via Security Cameras to continue. 

## 2018-05-14 NOTE — ED Notes (Signed)
Called Group home [phone] no answer.

## 2018-05-14 NOTE — BH Assessment (Signed)
Per Dr. Toni Amendlapacs, patient meets inpatient criteria. Due current acuity of the patient, she will be [facility] (05/15/2018).

## 2018-05-14 NOTE — ED Notes (Signed)
Patient continuing to express SI/HI and auditory hallucinations telling her to hurt herself and others. Patient states voices and ideations are getting more intense than they were last night. MD made aware.

## 2018-05-14 NOTE — ED Provider Notes (Signed)
Nurse let me know that the patient was upset about going back and stated that she felt like if she went back she would just kill herself.  I discussed with her, she states that she is unhappy there and states that if she goes back this will discolor self.  She is agreeable and call home to talk with psychiatrist in person here, I will place a consultation.  She states that she is not actively suicidal here in the emergency department, I will leave her voluntary for the time being if she is calm and cooperative.   Governor RooksLord, Deitrick Ferreri, MD 05/14/18 [phone]

## 2018-05-14 NOTE — Discharge Instructions (Addendum)
It was a pleasure to take care of you today, and thank you for coming to our emergency department.  If you have any questions or concerns before leaving please ask the nurse to grab me and I'm more than happy to go through your aftercare instructions again.  If you were prescribed any opioid pain medication today such as Norco, Vicodin, Percocet, morphine, hydrocodone, or oxycodone please make sure you do not drive when you are taking this medication as it can alter your ability to drive safely.  If you have any concerns once you are home that you are not improving or are in fact getting worse before you can make it to your follow-up appointment, please do not hesitate to call [phone] and come back for further evaluation.  Merrily BrittleNeil Haru Anspaugh, MD  Results for orders placed or performed during the hospital encounter of 05/13/18  Comprehensive metabolic panel  Result Value Ref Range   Sodium 141 135 - 145 mmol/L   Potassium 3.4 (L) 3.5 - 5.1 mmol/L   Chloride 107 98 - 111 mmol/L   CO2 27 22 - 32 mmol/L   Glucose, Bld 95 70 - 99 mg/dL   BUN 10 6 - 20 mg/dL   Creatinine, Ser 1.610.90 0.44 - 1.00 mg/dL   Calcium 9.2 8.9 - 09.610.3 mg/dL   Total Protein 7.8 6.5 - 8.1 g/dL   Albumin 4.9 3.5 - 5.0 g/dL   AST 16 15 - 41 U/L   ALT 13 0 - 44 U/L   Alkaline Phosphatase 61 38 - 126 U/L   Total Bilirubin 0.4 0.3 - 1.2 mg/dL   GFR calc non Af Amer >60 >60 mL/min   GFR calc Af Amer >60 >60 mL/min   Anion gap 7 5 - 15  Ethanol  Result Value Ref Range   Alcohol, Ethyl (B) <10 <10 mg/dL  Salicylate level  Result Value Ref Range   Salicylate Lvl <7.0 2.8 - 30.0 mg/dL  Acetaminophen level  Result Value Ref Range   Acetaminophen (Tylenol), Serum <10 (L) 10 - 30 ug/mL  cbc  Result Value Ref Range   WBC 10.7 3.6 - 11.0 K/uL   RBC 4.69 3.80 - 5.20 MIL/uL   Hemoglobin 14.0 12.0 - 16.0 g/dL   HCT 04.540.9 40.935.0 - 81.147.0 %   MCV 87.3 80.0 - 100.0 fL   MCH 29.9 26.0 - 34.0 pg   MCHC 34.2 32.0 - 36.0 g/dL   RDW 91.413.1 78.211.5 -  95.614.5 %   Platelets 321 150 - 440 K/uL  Urine Drug Screen, Qualitative  Result Value Ref Range   Tricyclic, Ur Screen NONE DETECTED NONE DETECTED   Amphetamines, Ur Screen NONE DETECTED NONE DETECTED   MDMA (Ecstasy)Ur Screen NONE DETECTED NONE DETECTED   Cocaine Metabolite,Ur [place] NONE DETECTED NONE DETECTED   Opiate, Ur Screen NONE DETECTED NONE DETECTED   Phencyclidine (PCP) Ur S NONE DETECTED NONE DETECTED   Cannabinoid 50 Ng, Ur [place] NONE DETECTED NONE DETECTED   Barbiturates, Ur Screen NONE DETECTED NONE DETECTED   Benzodiazepine, Ur Scrn NONE DETECTED NONE DETECTED   Methadone Scn, Ur NONE DETECTED NONE DETECTED  Pregnancy, urine POC  Result Value Ref Range   Preg Test, Ur NEGATIVE NEGATIVE

## 2018-05-15 DIAGNOSIS — F329 Major depressive disorder, single episode, unspecified: Secondary | ICD-10-CM | POA: Diagnosis not present

## 2018-05-15 DIAGNOSIS — F332 Major depressive disorder, recurrent severe without psychotic features: Secondary | ICD-10-CM | POA: Diagnosis not present

## 2018-05-15 MED ORDER — ZIPRASIDONE MESYLATE 20 MG IM SOLR
10.0000 mg | Freq: Once | INTRAMUSCULAR | Status: AC
  Administered 2018-05-15: 10 mg via INTRAMUSCULAR
  Filled 2018-05-15: qty 20

## 2018-05-15 MED ORDER — ARIPIPRAZOLE 10 MG PO TABS
30.0000 mg | ORAL_TABLET | Freq: Every day | ORAL | Status: DC
  Administered 2018-05-16 – 2018-05-18 (×3): 30 mg via ORAL
  Filled 2018-05-15 (×3): qty 3

## 2018-05-15 MED ORDER — ZIPRASIDONE MESYLATE 20 MG IM SOLR
20.0000 mg | Freq: Once | INTRAMUSCULAR | Status: AC
  Administered 2018-05-15: 20 mg via INTRAMUSCULAR
  Filled 2018-05-15: qty 20

## 2018-05-15 NOTE — ED Notes (Signed)
Hourly rounding reveals patient sleeping in room. No complaints, stable, in no acute distress. Q15 minute rounds and monitoring via Security Cameras to continue. 

## 2018-05-15 NOTE — ED Notes (Signed)
Patient received PM snack. 

## 2018-05-15 NOTE — ED Notes (Signed)
Pt woke up yelling and cursing and pacing back and forth.  Gave patient her AM medications.  Unable to redirect patient.  Pt yelling, "I want to kill myself!"   ED doctor notified and new order received for Geodon 20 mg IM.  Given to patient.  Awaiting effectiveness at this time.

## 2018-05-15 NOTE — ED Provider Notes (Signed)
-----------------------------------------   10:05 AM on 05/15/2018 -----------------------------------------  Nurse called me to tell me that the patient is agitated currently banging her head hardly against the wall.  We will dose 20 mg of Geodon for patient safety.  Patient received this medication yesterday without ill effects.   Minna AntisPaduchowski, Andy Allende, MD 05/15/18 1006

## 2018-05-15 NOTE — Consult Note (Signed)
[facility] Consult   Reason for Consult: Follow-up for young woman with autistic spectrum disorder who is in the emergency room because of ongoing suicidal threats Referring Physician: Quale Patient Identification: Brittany Palmer MRN:  [id number] Principal Diagnosis: MDD (major depressive disorder), recurrent severe, without psychosis ([place]) Diagnosis:   Patient Active Problem List   Diagnosis Date Noted  . MDD (major depressive disorder), severe ([place]) [F32.2] 09/09/2017  . Autism spectrum disorder [F84.0] 05/17/2017  . MDD (major depressive disorder), recurrent severe, without psychosis ([place]) [F33.2] 05/16/2017    Total Time spent with patient: 30 minutes  Subjective:   Brittany Palmer is a 22 y.o. female patient admitted with "I am very angry".  HPI: Patient seen chart reviewed.  Patient continues to be labile.  At times will scream about being angry at times will scream about wanting to kill herself.  Has banged her head during the day has been agitated.  Fortunately has not assaulted or threatened anyone else.  Talks about still hearing voices.  Cannot describe why she is feeling so angry.  Past Psychiatric History: History of autism  Risk to Self: Suicidal Ideation: Yes-Currently Present Suicidal Intent: Yes-Currently Present Is patient at risk for suicide?: Yes Suicidal Plan?: Yes-Currently Present Specify Current Suicidal Plan: Pt reports to wanting cut herself, grabbed knife while at group home Access to Means: Yes Specify Access to Suicidal Means: Pt reportedly obtained a kitchen knife What has been your use of drugs/alcohol within the last 12 months?: none How many times?: 3 Other Self Harm Risks: cutting Triggers for Past Attempts: Other personal contacts Intentional Self Injurious Behavior: Cutting Comment - Self Injurious Behavior: Pt reports to scratching her wrists previously when angry Risk to Others: Homicidal Ideation: Yes-Currently Present Thoughts  of Harm to Others: Yes-Currently Present Comment - Thoughts of Harm to Others: Pt reports to wanting to hurt residents at group home Current Homicidal Intent: Yes-Currently Present Current Homicidal Plan: Yes-Currently Present Describe Current Homicidal Plan: Pt obtained knife and made threats towards others Access to Homicidal Means: Yes Describe Access to Homicidal Means: Pt obtained kitchen knife Identified Victim: Residents at group home Assessment of Violence: On admission Violent Behavior Description: Pt triggered by resident, stated she wanted to kill herself and harm others' Does patient have access to weapons?: Yes (Comment) Criminal Charges Pending?: No Does patient have a court date: No Prior Inpatient Therapy: Prior Inpatient Therapy: Yes Prior Therapy Dates: 2019,2018,2017 Prior Therapy Facilty/Provider(s): [facility], [facility], [facility],  Reason for Treatment: ASD, aggression Prior Outpatient Therapy: Prior Outpatient Therapy: No Does patient have an ACCT team?: No Does patient have Intensive In-House Services?  : No Does patient have [facility] services? : No Does patient have P4CC services?: No  Past Medical History:  Past Medical History:  Diagnosis Date  . Autism   . GERD (gastroesophageal reflux disease)   . Hypertension     Past Surgical History:  Procedure Laterality Date  . TONSILLECTOMY     Family History: History reviewed. No pertinent family history. Family Psychiatric  History: None known Social History:  Social History   Substance and Sexual Activity  Alcohol Use No     Social History   Substance and Sexual Activity  Drug Use No    Social History   Socioeconomic History  . Marital status: Single    Spouse name: Not on file  . Number of children: Not on file  . Years of education: Not on file  . Highest education level: Not on  file  Occupational History  . Not on file  Social Needs  . Financial resource strain: Not on file  . Food  insecurity:    Worry: Not on file    Inability: Not on file  . Transportation needs:    Medical: Not on file    Non-medical: Not on file  Tobacco Use  . Smoking status: Never Smoker  . Smokeless tobacco: Never Used  Substance and Sexual Activity  . Alcohol use: No  . Drug use: No  . Sexual activity: Never  Lifestyle  . Physical activity:    Days per week: Not on file    Minutes per session: Not on file  . Stress: Not on file  Relationships  . Social connections:    Talks on phone: Not on file    Gets together: Not on file    Attends religious service: Not on file    Active member of club or organization: Not on file    Attends meetings of clubs or organizations: Not on file    Relationship status: Not on file  Other Topics Concern  . Not on file  Social History Narrative  . Not on file   Additional Social History:    Allergies:   Allergies  Allergen Reactions  . Lisinopril Cough  . Prozac [Fluoxetine] Other (See Comments)  . Risperdal [Risperidone] Other (See Comments)    Twitching of mouth     Labs:  Results for orders placed or performed during the hospital encounter of 05/13/18 (from the past 48 hour(s))  Comprehensive metabolic panel     Status: Abnormal   Collection Time: 05/13/18  8:51 PM  Result Value Ref Range   Sodium 141 135 - 145 mmol/L   Potassium 3.4 (L) 3.5 - 5.1 mmol/L   Chloride 107 98 - 111 mmol/L   CO2 27 22 - 32 mmol/L   Glucose, Bld 95 70 - 99 mg/dL   BUN 10 6 - 20 mg/dL   Creatinine, Ser 0.90 0.44 - 1.00 mg/dL   Calcium 9.2 8.9 - 10.3 mg/dL   Total Protein 7.8 6.5 - 8.1 g/dL   Albumin 4.9 3.5 - 5.0 g/dL   AST 16 15 - 41 U/L   ALT 13 0 - 44 U/L   Alkaline Phosphatase 61 38 - 126 U/L   Total Bilirubin 0.4 0.3 - 1.2 mg/dL   GFR calc non Af Amer >60 >60 mL/min   GFR calc Af Amer >60 >60 mL/min    Comment: (NOTE) The eGFR has been calculated using the CKD EPI equation. This calculation has not been validated in all clinical  situations. eGFR's persistently <60 mL/min signify possible Chronic Kidney Disease.    Anion gap 7 5 - 15    Comment: Performed at [facility], [address] [place], [place] [address]  Ethanol     Status: None   Collection Time: 05/13/18  8:51 PM  Result Value Ref Range   Alcohol, Ethyl (B) <10 <10 mg/dL    Comment: (NOTE) Lowest detectable limit for serum alcohol is 10 mg/dL. For medical purposes only. Performed at [facility], [address] [place], [place] [address]   Salicylate level     Status: None   Collection Time: 05/13/18  8:51 PM  Result Value Ref Range   Salicylate Lvl <6.2 2.8 - 30.0 mg/dL    Comment: Performed at [facility], [address], [place], [place] [address]  Acetaminophen level  Status: Abnormal   Collection Time: 05/13/18  8:51 PM  Result Value Ref Range   Acetaminophen (Tylenol), Serum <10 (L) 10 - 30 ug/mL    Comment: (NOTE) Therapeutic concentrations vary significantly. A range of 10-30 ug/mL  may be an effective concentration for many patients. However, some  are best treated at concentrations outside of this range. Acetaminophen concentrations >150 ug/mL at 4 hours after ingestion  and >50 ug/mL at 12 hours after ingestion are often associated with  toxic reactions. Performed at [facility], [address] [place], [place] [address]   cbc     Status: None   Collection Time: 05/13/18  8:51 PM  Result Value Ref Range   WBC 10.7 3.6 - 11.0 K/uL   RBC 4.69 3.80 - 5.20 MIL/uL   Hemoglobin 14.0 12.0 - 16.0 g/dL   HCT 40.9 35.0 - 47.0 %   MCV 87.3 80.0 - 100.0 fL   MCH 29.9 26.0 - 34.0 pg   MCHC 34.2 32.0 - 36.0 g/dL   RDW 13.1 11.5 - 14.5 %   Platelets 321 150 - 440 K/uL    Comment: Performed at [facility], [address], [place], [place] [address]  Urine Drug Screen, Qualitative     Status: None   Collection Time: 05/13/18  8:51 PM  Result Value Ref Range   Tricyclic, Ur  Screen NONE DETECTED NONE DETECTED   Amphetamines, Ur Screen NONE DETECTED NONE DETECTED   MDMA (Ecstasy)Ur Screen NONE DETECTED NONE DETECTED   Cocaine Metabolite,Ur [place] NONE DETECTED NONE DETECTED   Opiate, Ur Screen NONE DETECTED NONE DETECTED   Phencyclidine (PCP) Ur S NONE DETECTED NONE DETECTED   Cannabinoid 50 Ng, Ur [place] NONE DETECTED NONE DETECTED   Barbiturates, Ur Screen NONE DETECTED NONE DETECTED   Benzodiazepine, Ur Scrn NONE DETECTED NONE DETECTED   Methadone Scn, Ur NONE DETECTED NONE DETECTED    Comment: (NOTE) Tricyclics + metabolites, urine    Cutoff 1000 ng/mL Amphetamines + metabolites, urine  Cutoff 1000 ng/mL MDMA (Ecstasy), urine              Cutoff 500 ng/mL Cocaine Metabolite, urine          Cutoff 300 ng/mL Opiate + metabolites, urine        Cutoff 300 ng/mL Phencyclidine (PCP), urine         Cutoff 25 ng/mL Cannabinoid, urine                 Cutoff 50 ng/mL Barbiturates + metabolites, urine  Cutoff 200 ng/mL Benzodiazepine, urine              Cutoff 200 ng/mL Methadone, urine                   Cutoff 300 ng/mL The urine drug screen provides only a preliminary, unconfirmed analytical test result and should not be used for non-medical purposes. Clinical consideration and professional judgment should be applied to any positive drug screen result due to possible interfering substances. A more specific alternate chemical method must be used in order to obtain a confirmed analytical result. Gas chromatography / mass spectrometry (GC/MS) is the preferred confirmat ory method. Performed at [facility], [address] [place], [place] [address]   Pregnancy, urine POC     Status: None   Collection Time: 05/13/18  8:57 PM  Result Value Ref Range   Preg Test, Ur NEGATIVE NEGATIVE    Comment:  THE SENSITIVITY OF THIS METHODOLOGY IS >24 mIU/mL     Current Facility-Administered Medications  Medication Dose Route Frequency Provider Last Rate Last  Dose  . albuterol (PROVENTIL HFA;VENTOLIN HFA) 108 (90 Base) MCG/ACT inhaler 2 puff  2 puff Inhalation Q6H PRN Lizzett Nobile, Madie Reno, MD      . Derrill Memo ON 05/16/2018] ARIPiprazole (ABILIFY) tablet 30 mg  30 mg Oral Daily Junior Huezo T, MD      . escitalopram (LEXAPRO) tablet 10 mg  10 mg Oral Daily Jazmaine Fuelling, Madie Reno, MD   10 mg at 05/15/18 0933  . hydrochlorothiazide (MICROZIDE) capsule 12.5 mg  12.5 mg Oral Daily Trusten Hume, Madie Reno, MD   12.5 mg at 05/15/18 0934  . losartan (COZAAR) tablet 25 mg  25 mg Oral Daily Abdulaziz Toman, Madie Reno, MD   25 mg at 05/15/18 0935  . pantoprazole (PROTONIX) EC tablet 40 mg  40 mg Oral Daily Izzie Geers, Madie Reno, MD   40 mg at 05/15/18 0934  . traZODone (DESYREL) tablet 100 mg  100 mg Oral QHS Krissie Merrick, Madie Reno, MD   100 mg at 05/14/18 2305   Current Outpatient Medications  Medication Sig Dispense Refill  . albuterol (PROVENTIL HFA;VENTOLIN HFA) 108 (90 Base) MCG/ACT inhaler Inhale 2 puffs into the lungs every 6 (six) hours as needed for wheezing or shortness of breath.     . ARIPiprazole (ABILIFY) 20 MG tablet Take 1 tablet (20 mg total) by mouth daily. 30 tablet 0  . escitalopram (LEXAPRO) 10 MG tablet Take 1 tablet (10 mg total) by mouth daily. 30 tablet 0  . fluticasone (FLONASE) 50 MCG/ACT nasal spray Place 1 spray into both nostrils daily.    . hydrochlorothiazide (MICROZIDE) 12.5 MG capsule Take 12.5 mg by mouth daily.    . hydrOXYzine (ATARAX/VISTARIL) 25 MG tablet Take 1 tablet (25 mg total) by mouth 3 (three) times daily as needed for anxiety. 30 tablet 0  . losartan (COZAAR) 25 MG tablet Take 25 mg by mouth daily.    . pantoprazole (PROTONIX) 40 MG tablet Take 1 tablet (40 mg total) by mouth daily. 30 tablet 0  . traZODone (DESYREL) 100 MG tablet Take 1 tablet (100 mg total) by mouth at bedtime as needed for sleep. 30 tablet 0  . verapamil (CALAN-SR) 240 MG CR tablet Take 240 mg by mouth at bedtime.      Musculoskeletal: Strength & Muscle Tone: decreased Gait & Station:  normal Patient leans: N/A  Psychiatric Specialty Exam: Physical Exam  Nursing note and vitals reviewed. Constitutional: She appears well-developed and well-nourished.  HENT:  Head: Normocephalic and atraumatic.  Eyes: Pupils are equal, round, and reactive to light. Conjunctivae are normal.  Neck: Normal range of motion.  Cardiovascular: Regular rhythm and normal heart sounds.  Respiratory: Effort normal. No respiratory distress.  GI: Soft.  Musculoskeletal: Normal range of motion.  Neurological: She is alert.  Skin: Skin is warm and dry.  Psychiatric: Her affect is labile. Her speech is tangential. She is agitated. Thought content is paranoid. Cognition and memory are impaired. She expresses impulsivity. She expresses suicidal ideation.    Review of Systems  Constitutional: Negative.   HENT: Negative.   Eyes: Negative.   Respiratory: Negative.   Cardiovascular: Negative.   Gastrointestinal: Negative.   Musculoskeletal: Negative.   Skin: Negative.   Neurological: Negative.   Psychiatric/Behavioral: Positive for depression, hallucinations and suicidal ideas.    Blood pressure 117/72, pulse 97, temperature 97.9 F (36.6 C), temperature source Oral, resp. rate  18, weight 75.3 kg, SpO2 99 %.Body mass index is 27.62 kg/m.  General Appearance: Casual  Eye Contact:  Fair  Speech:  Slow  Volume:  Decreased  Mood:  Angry, Anxious and Depressed  Affect:  Depressed  Thought Process:  Disorganized  Orientation:  Full (Time, Place, and Person)  Thought Content:  Illogical  Suicidal Thoughts:  Yes.  without intent/plan  Homicidal Thoughts:  No  Memory:  Immediate;   Fair Recent;   Fair Remote;   Fair  Judgement:  Impaired  Insight:  Shallow  Psychomotor Activity:  Decreased  Concentration:  Concentration: Poor  Recall:  Poor  Fund of Knowledge:  Fair  Language:  Fair  Akathisia:  No  Handed:  Right  AIMS (if indicated):     Assets:  Desire for Improvement  ADL's:  Impaired   Cognition:  Impaired,  Moderate  Sleep:        Treatment Plan Summary: Medication management and Plan Increase Abilify to 30 mg.  Patient still does not meet criteria for admission at this point because of diagnosis.  Continue trying to manage in the emergency room so that we can get her back home.  Disposition: Patient does not meet criteria for psychiatric inpatient admission.  Alethia Berthold, MD 05/15/2018 7:48 PM

## 2018-05-15 NOTE — ED Notes (Signed)
Pt sleeping. Meal tray placed beside bed.

## 2018-05-15 NOTE — ED Provider Notes (Signed)
-----------------------------------------   7:20 AM on 05/15/2018 -----------------------------------------   Blood pressure 117/72, pulse 97, temperature 97.9 F (36.6 C), temperature source Oral, resp. rate 18, weight 75.3 kg, SpO2 99 %.  The patient had no acute events since last update.  Calm and cooperative at this time.  Disposition is pending Psychiatry/Behavioral Medicine team recommendations.     Irean HongSung, Jade J, MD 05/15/18 [phone]

## 2018-05-15 NOTE — ED Notes (Signed)
VOL/Pending placement 

## 2018-05-16 DIAGNOSIS — F329 Major depressive disorder, single episode, unspecified: Secondary | ICD-10-CM | POA: Diagnosis not present

## 2018-05-16 LAB — URINALYSIS, COMPLETE (UACMP) WITH MICROSCOPIC
BILIRUBIN URINE: NEGATIVE
Glucose, UA: NEGATIVE mg/dL
Hgb urine dipstick: NEGATIVE
Ketones, ur: NEGATIVE mg/dL
LEUKOCYTES UA: NEGATIVE
NITRITE: NEGATIVE
Protein, ur: NEGATIVE mg/dL
Specific Gravity, Urine: 1.03 (ref 1.005–1.030)
pH: 5 (ref 5.0–8.0)

## 2018-05-16 MED ORDER — GUAIFENESIN 100 MG/5ML PO SOLN
5.0000 mL | Freq: Four times a day (QID) | ORAL | Status: DC | PRN
  Administered 2018-05-16 – 2018-05-17 (×2): 100 mg via ORAL
  Filled 2018-05-16 (×4): qty 5

## 2018-05-16 MED ORDER — DICYCLOMINE HCL 10 MG PO CAPS
20.0000 mg | ORAL_CAPSULE | Freq: Once | ORAL | Status: AC
  Administered 2018-05-16: 20 mg via ORAL
  Filled 2018-05-16 (×2): qty 2

## 2018-05-16 NOTE — ED Provider Notes (Signed)
Vitals:   05/14/18 1949 05/15/18 2000  BP: 117/72 111/78  Pulse: 97 80  Resp: 18 18  Temp: 97.9 F (36.6 C) 98 F (36.7 C)  SpO2: 99% 99%   No acute events reported to me overnight.  Patient is autistic with intellectual disability as per notes, I reviewed Dr. Toni Amendlapacs, psychiatry note yesterday, planning to manage in the emergency department.   Governor RooksLord, Brittany Cozzolino, MD 05/16/18 [phone]

## 2018-05-16 NOTE — ED Notes (Signed)
Pt complaining of pain with urination. EDP made aware. Urinalysis ordered.   Maintained on 15 minute checks and observation by security camera for safety.

## 2018-05-16 NOTE — ED Notes (Signed)

## 2018-05-16 NOTE — ED Notes (Signed)
Pt in dayroom with other patients.  Given crayons and paper as requested. Calm and cooperative.   Maintained on 15 minute checks and observation by security camera for safety.

## 2018-05-16 NOTE — ED Notes (Signed)
Patient is alert and oriented to person, place and time.  Presents with irritable affect and mood.  Continues to endorse suicidal ideation with no plan.  States Brittany Palmer is very angry and does not want to go back to the group home.  Observed pacing the hallway most of this shift. Patient repeatedly using profanity and mumbling words to herself.  Intentionally making herself cough to induce asthma attack per patient statement.  Patient also seen in the bathroom several times to inducing herself to vomit. Medication given as prescribed.  Offered support and therapeutic discussion as needed.  Patient refused to engage when offered.  Banged her head and threw herself on the floor but was redirected.  Routine safety checks maintained every 15 minutes.  Patient remained safe on the unit.

## 2018-05-16 NOTE — ED Notes (Signed)
Patient received PM snack. 

## 2018-05-16 NOTE — ED Notes (Signed)
Moved to BHU °

## 2018-05-16 NOTE — ED Notes (Signed)
Patient received PM tray.

## 2018-05-16 NOTE — ED Notes (Signed)

## 2018-05-16 NOTE — ED Notes (Addendum)
Pt requesting her inhaler. RN told patient she can only use the inhaler every 6 hours (after 6 pm tonight). Pt accepting.

## 2018-05-17 DIAGNOSIS — F329 Major depressive disorder, single episode, unspecified: Secondary | ICD-10-CM | POA: Diagnosis not present

## 2018-05-17 NOTE — ED Notes (Signed)
Am meds administered as ordered  VSS  Continue to monitor

## 2018-05-17 NOTE — ED Notes (Signed)
Patient alert and oriented, warm and dry, in no acute distress. Patient denies SI, AVH and pain. Patient states she would hurt group home staff if she was discharged. Patient made aware of Q15 minute rounds and security cameras for their safety. Patient instructed to come to me with needs or concerns.

## 2018-05-17 NOTE — ED Notes (Signed)
BEHAVIORAL HEALTH ROUNDING Patient sleeping: No. Patient alert and oriented: yes Behavior appropriate: Yes.  ; If no, describe:  Nutrition and fluids offered: yes Toileting and hygiene offered: Yes  Sitter present: q15 minute observations and security camera monitoring Law enforcement present: Yes  ODS  

## 2018-05-17 NOTE — ED Notes (Signed)
Hourly rounding reveals patient sleeping in room. No complaints, stable, in no acute distress. Q15 minute rounds and monitoring via Security Cameras to continue. 

## 2018-05-17 NOTE — ED Notes (Signed)
BEHAVIORAL HEALTH ROUNDING Patient sleeping: No. Patient alert and oriented: yes Behavior appropriate: Yes.  ; If no, describe:  Nutrition and fluids offered: yes Toileting and hygiene offered: Yes  Sitter present: q15 minute observations and security  monitoring Law enforcement present: Yes  ODS  

## 2018-05-17 NOTE — ED Notes (Signed)

## 2018-05-17 NOTE — ED Notes (Signed)
Hourly rounding reveals patient in room. No complaints, stable, in no acute distress. Q15 minute rounds and monitoring via Security Cameras to continue. 

## 2018-05-17 NOTE — ED Notes (Addendum)
Report to include situation, background, assessment and recommendations from Amy Teague RN. Patient sleeping, respirations regular and unlabored. Q15 minute rounds and security camera observation to continue.   

## 2018-05-17 NOTE — ED Notes (Signed)
Patient observed lying in bed with eyes closed  Even, unlabored respirations observed   NAD pt appears to be sleeping  I will continue to monitor along with every 15 minute visual observations and ongoing security camera monitoring    

## 2018-05-17 NOTE — ED Notes (Signed)
Patient is awake; coughing.

## 2018-05-17 NOTE — ED Notes (Signed)
ED BHU PLACEMENT JUSTIFICATION Is the patient under IVC or is there intent for IVC: Yes.   Is the patient medically cleared: Yes.   Is there vacancy in the ED BHU: Yes.   Is the population mix appropriate for patient: Yes.   Is the patient awaiting placement in inpatient or outpatient setting: Yes.  Group home placement Has the patient had a psychiatric consult: Yes.   Survey of unit performed for contraband, proper placement and condition of furniture, tampering with fixtures in bathroom, shower, and each patient room: Yes.  ; Findings:  APPEARANCE/BEHAVIOR Calm and cooperative NEURO ASSESSMENT Orientation: oriented x3  Denies pain Hallucinations: No.None noted (Hallucinations) denies at present  Speech: Normal  Deep  Slow to respond at times  Gait: normal RESPIRATORY ASSESSMENT Even  Unlabored respirations  CARDIOVASCULAR ASSESSMENT Pulses equal   regular rate  Skin warm and dry   GASTROINTESTINAL ASSESSMENT no GI complaint EXTREMITIES Full ROM  PLAN OF CARE Provide calm/safe environment. Vital signs assessed twice daily. ED BHU Assessment once each 12-hour shift. Collaborate with TTS daily or as condition indicates. Assure the ED provider has rounded once each shift. Provide and encourage hygiene. Provide redirection as needed. Assess for escalating behavior; address immediately and inform ED provider.  Assess family dynamic and appropriateness for visitation as needed: Yes.  ; If necessary, describe findings:  Educate the patient/family about BHU procedures/visitation: Yes.  ; If necessary, describe findings:

## 2018-05-17 NOTE — ED Provider Notes (Signed)
-----------------------------------------   7:06 AM on 05/17/2018 -----------------------------------------   Blood pressure 111/78, pulse 80, temperature 98 F (36.7 C), temperature source Oral, resp. rate 18, weight 75.3 kg, SpO2 99 %.  The patient had no acute events since last update.  Calm and cooperative at this time.  Pending placement   Loleta RoseForbach, Aleira Deiter, MD 05/17/18 [phone]

## 2018-05-18 DIAGNOSIS — F329 Major depressive disorder, single episode, unspecified: Secondary | ICD-10-CM | POA: Diagnosis not present

## 2018-05-18 LAB — URINE CULTURE

## 2018-05-18 NOTE — ED Notes (Signed)
Hourly rounding reveals patient sleeping in room. No complaints, stable, in no acute distress. Q15 minute rounds and monitoring via Security Cameras to continue. 

## 2018-05-18 NOTE — BH Assessment (Signed)
[facility] by Dr. Toni Amendlapacs .  Attending Physician will be Dr. Johnella MoloneyMcNew.   Patient has been [facility] to room 307, by [facility] Charge Nurse [employer].   Intake Paper Work has been signed and placed on patient chart.  ER staff is aware of the admission:  [facility] Secretary      Jillyn HiddenGary Patient's Nurse

## 2018-05-18 NOTE — ED Notes (Signed)
PT  VOL/  PENDING  PLACEMENT 

## 2018-05-18 NOTE — ED Notes (Signed)
Hourly rounding reveals patient in day room. No complaints, stable, in no acute distress. Q15 minute rounds and monitoring via Security Cameras to continue. 

## 2018-05-18 NOTE — ED Provider Notes (Signed)
-----------------------------------------   12:07 AM on 05/18/2018 -----------------------------------------   Blood pressure 122/77, pulse 72, temperature 98.5 F (36.9 C), temperature source Oral, resp. rate 17, weight 75.3 kg, SpO2 100 %.  The patient had no acute events since last update.  Calm and cooperative at this time.  Disposition is pending Psychiatry/Behavioral Medicine team recommendations.     Merrily Brittleifenbark, Lynia Landry, MD 05/18/18 [phone]

## 2018-05-18 NOTE — ED Notes (Signed)
VOL, [facility]

## 2018-05-18 NOTE — ED Notes (Signed)
Hourly rounding reveals patient in room. No complaints, stable, in no acute distress. Q15 minute rounds and monitoring via Security Cameras to continue. 

## 2018-05-18 NOTE — ED Notes (Signed)
Report to include situation, background, assessment and recommendations from Rhea RN. Patient sleeping, respirations regular and unlabored. Q15 minute rounds and security camera observation to continue.    

## 2018-05-19 ENCOUNTER — Inpatient Hospital Stay
Admission: AD | Admit: 2018-05-19 | Discharge: 2018-05-25 | DRG: 885 | Disposition: A | Discharge status: Home / Self Care | Payer: BLUE CROSS/BLUE SHIELD | Source: Intra-hospital | Attending: Psychiatry | Admitting: Psychiatry

## 2018-05-19 ENCOUNTER — Other Ambulatory Visit: Payer: Self-pay

## 2018-05-19 ENCOUNTER — Encounter: Payer: Self-pay | Admitting: Psychiatry

## 2018-05-19 DIAGNOSIS — K219 Gastro-esophageal reflux disease without esophagitis: Secondary | ICD-10-CM | POA: Diagnosis present

## 2018-05-19 DIAGNOSIS — J45909 Unspecified asthma, uncomplicated: Secondary | ICD-10-CM | POA: Diagnosis present

## 2018-05-19 DIAGNOSIS — Z888 Allergy status to other drugs, medicaments and biological substances status: Secondary | ICD-10-CM

## 2018-05-19 DIAGNOSIS — G4700 Insomnia, unspecified: Secondary | ICD-10-CM | POA: Diagnosis present

## 2018-05-19 DIAGNOSIS — F840 Autistic disorder: Secondary | ICD-10-CM | POA: Diagnosis not present

## 2018-05-19 DIAGNOSIS — E162 Hypoglycemia, unspecified: Secondary | ICD-10-CM | POA: Diagnosis present

## 2018-05-19 DIAGNOSIS — R05 Cough: Secondary | ICD-10-CM

## 2018-05-19 DIAGNOSIS — Z79899 Other long term (current) drug therapy: Secondary | ICD-10-CM

## 2018-05-19 DIAGNOSIS — R45851 Suicidal ideations: Secondary | ICD-10-CM | POA: Diagnosis present

## 2018-05-19 DIAGNOSIS — F333 Major depressive disorder, recurrent, severe with psychotic symptoms: Secondary | ICD-10-CM | POA: Diagnosis not present

## 2018-05-19 DIAGNOSIS — N390 Urinary tract infection, site not specified: Secondary | ICD-10-CM | POA: Diagnosis not present

## 2018-05-19 DIAGNOSIS — R059 Cough, unspecified: Secondary | ICD-10-CM

## 2018-05-19 DIAGNOSIS — I10 Essential (primary) hypertension: Secondary | ICD-10-CM | POA: Diagnosis present

## 2018-05-19 DIAGNOSIS — F315 Bipolar disorder, current episode depressed, severe, with psychotic features: Secondary | ICD-10-CM | POA: Diagnosis present

## 2018-05-19 DIAGNOSIS — Z01810 Encounter for preprocedural cardiovascular examination: Secondary | ICD-10-CM | POA: Diagnosis not present

## 2018-05-19 DIAGNOSIS — Z818 Family history of other mental and behavioral disorders: Secondary | ICD-10-CM

## 2018-05-19 LAB — LIPID PANEL
CHOL/HDL RATIO: 2.9 ratio
CHOLESTEROL: 106 mg/dL (ref 0–200)
HDL: 37 mg/dL — ABNORMAL LOW (ref >40)
LDL Cholesterol: 56 mg/dL (ref 0–99)
Triglycerides: 64 mg/dL (ref <150)
VLDL: 13 mg/dL (ref 0–40)

## 2018-05-19 LAB — URINALYSIS, COMPLETE (UACMP) WITH MICROSCOPIC
Bilirubin Urine: NEGATIVE
GLUCOSE, UA: NEGATIVE mg/dL
Ketones, ur: NEGATIVE mg/dL
Leukocytes, UA: NEGATIVE
Nitrite: NEGATIVE
PROTEIN: NEGATIVE mg/dL
Specific Gravity, Urine: 1.021 (ref 1.005–1.030)
pH: 5 (ref 5.0–8.0)

## 2018-05-19 LAB — GLUCOSE, CAPILLARY: GLUCOSE-CAPILLARY: 94 mg/dL (ref 70–99)

## 2018-05-19 LAB — TSH: TSH: 2.257 u[IU]/mL (ref 0.350–4.500)

## 2018-05-19 MED ORDER — ALBUTEROL SULFATE HFA 108 (90 BASE) MCG/ACT IN AERS
2.0000 | INHALATION_SPRAY | Freq: Four times a day (QID) | RESPIRATORY_TRACT | Status: DC | PRN
  Administered 2018-05-20 – 2018-05-21 (×2): 2 via RESPIRATORY_TRACT
  Filled 2018-05-19: qty 6.7

## 2018-05-19 MED ORDER — ARIPIPRAZOLE 10 MG PO TABS: 20.0000 mg | ORAL_TABLET | Freq: Every day | ORAL | Status: DC

## 2018-05-19 MED ORDER — FLUTICASONE PROPIONATE 50 MCG/ACT NA SUSP
1.0000 | Freq: Every day | NASAL | Status: DC
  Administered 2018-05-19 – 2018-05-21 (×3): 1 via NASAL
  Filled 2018-05-19: qty 16

## 2018-05-19 MED ORDER — HYDROXYZINE HCL 50 MG PO TABS
50.0000 mg | ORAL_TABLET | Freq: Three times a day (TID) | ORAL | Status: DC | PRN
  Administered 2018-05-19 – 2018-05-23 (×2): 50 mg via ORAL
  Filled 2018-05-19 (×3): qty 1

## 2018-05-19 MED ORDER — MAGNESIUM HYDROXIDE 400 MG/5ML PO SUSP: 30.0000 mL | Freq: Every day | ORAL | Status: DC | PRN

## 2018-05-19 MED ORDER — TRAZODONE HCL 100 MG PO TABS: 100.0000 mg | ORAL_TABLET | Freq: Every evening | ORAL | Status: DC | PRN

## 2018-05-19 MED ORDER — LOSARTAN POTASSIUM 25 MG PO TABS: 25.0000 mg | ORAL_TABLET | Freq: Every day | ORAL | Status: DC

## 2018-05-19 MED ORDER — LOSARTAN POTASSIUM 25 MG PO TABS
25.0000 mg | ORAL_TABLET | Freq: Every day | ORAL | Status: DC
  Filled 2018-05-19: qty 1

## 2018-05-19 MED ORDER — PANTOPRAZOLE SODIUM 40 MG PO TBEC: 40.0000 mg | DELAYED_RELEASE_TABLET | Freq: Every day | ORAL | Status: DC

## 2018-05-19 MED ORDER — ALUM & MAG HYDROXIDE-SIMETH 200-200-20 MG/5ML PO SUSP: 30.0000 mL | ORAL | Status: DC | PRN

## 2018-05-19 MED ORDER — ALBUTEROL SULFATE HFA 108 (90 BASE) MCG/ACT IN AERS: 2.0000 | INHALATION_SPRAY | Freq: Four times a day (QID) | RESPIRATORY_TRACT | Status: DC | PRN

## 2018-05-19 MED ORDER — TRAZODONE HCL 100 MG PO TABS: 100.0000 mg | ORAL_TABLET | Freq: Every day | ORAL | Status: DC

## 2018-05-19 MED ORDER — ESCITALOPRAM OXALATE 10 MG PO TABS: 10.0000 mg | ORAL_TABLET | Freq: Every day | ORAL | Status: DC

## 2018-05-19 MED ORDER — ACETAMINOPHEN 325 MG PO TABS: 650.0000 mg | ORAL_TABLET | Freq: Four times a day (QID) | ORAL | Status: DC | PRN

## 2018-05-19 MED ORDER — PANTOPRAZOLE SODIUM 40 MG PO TBEC
40.0000 mg | DELAYED_RELEASE_TABLET | Freq: Every day | ORAL | Status: DC
  Administered 2018-05-19 – 2018-05-25 (×7): 40 mg via ORAL
  Filled 2018-05-19 (×7): qty 1

## 2018-05-19 MED ORDER — HYDROXYZINE HCL 25 MG PO TABS: 25.0000 mg | ORAL_TABLET | Freq: Three times a day (TID) | ORAL | Status: DC | PRN

## 2018-05-19 MED ORDER — VERAPAMIL HCL ER 240 MG PO TBCR
240.0000 mg | EXTENDED_RELEASE_TABLET | Freq: Every day | ORAL | Status: DC
  Administered 2018-05-19 – 2018-05-24 (×7): 240 mg via ORAL
  Filled 2018-05-19 (×9): qty 1

## 2018-05-19 MED ORDER — HYDROCHLOROTHIAZIDE 12.5 MG PO CAPS: 12.5000 mg | ORAL_CAPSULE | Freq: Every day | ORAL | Status: DC

## 2018-05-19 MED ORDER — QUETIAPINE FUMARATE 100 MG PO TABS
100.0000 mg | ORAL_TABLET | Freq: Every day | ORAL | Status: DC
  Administered 2018-05-19: 100 mg via ORAL
  Filled 2018-05-19 (×2): qty 1

## 2018-05-19 MED ORDER — GUAIFENESIN 100 MG/5ML PO SOLN
5.0000 mL | Freq: Four times a day (QID) | ORAL | Status: DC | PRN
  Administered 2018-05-20 – 2018-05-21 (×2): 100 mg via ORAL
  Filled 2018-05-19 (×3): qty 5

## 2018-05-19 MED ORDER — ARIPIPRAZOLE 10 MG PO TABS
30.0000 mg | ORAL_TABLET | Freq: Every day | ORAL | Status: DC
  Administered 2018-05-19: 30 mg via ORAL
  Filled 2018-05-19: qty 3

## 2018-05-19 NOTE — Tx Team (Signed)
Initial Treatment Plan 05/19/2018 1:23 AM Brittany JulyMacy Palmer [id number]    PATIENT STRESSORS: Other: Group home resident bullying   Depression  Stress Anxiety   PATIENT STRENGTHS: Ability for insight Active sense of humor Communication skills Motivation for treatment/growth Physical Health Special hobby/interest Supportive family/friends   PATIENT IDENTIFIED PROBLEMS: Suicidal thoughts 05/19/18  Bullying 05/19/18  Depression 05/19/18  Anxiety 05/19/18  Stress 05/19/18             DISCHARGE CRITERIA:  Adequate post-discharge living arrangements Improved stabilization in mood, thinking, and/or behavior Motivation to continue treatment in a less acute level of care Need for constant or close observation no longer present Verbal commitment to aftercare and medication compliance  PRELIMINARY DISCHARGE PLAN: Outpatient therapy Placement in alternative living arrangements  PATIENT/FAMILY INVOLVEMENT: This treatment plan has been presented to and reviewed with the patient, Brittany JulyMacy Palmer.  The patient has been given the opportunity to ask questions and make suggestions.  Brittany Pondserry J Alexiah Koroma, RN 05/19/2018, 1:23 AM

## 2018-05-19 NOTE — Progress Notes (Signed)
[facility] MD Progress Note  05/20/2018 10:18 AM Akina Maish  MRN:  [id number]  Subjective:    Ms. Hammond has no complaints today. Yesterday she felt dizzy, had blurred vision, and nausea likely from high dose of oral Abilify. She tolerated addition of Seroquel well and slept 7 hours. She still com[plains of "bad thoughts in her head" but is able to contract for safety.  Spoke with the mother who is the guardian. She approves of any medication changes necessary to make [facility] better. She was unaware that the patient is hospitalized. Of note, Hadlyn has been in the ER since 9/11.  Spoke with Ms. Haynes Dage, the group home administrator. Jazyah did not threaten with a knife, she has no access to knives at the facility. Markeshia is welcome to return to her facility where she has been residing since January of 2019. She confirmed that the patient has been on Abilify injections and will fax her list of medications.   Both, the mother and Ms. Lanae Boast agree that her problems are behavioral rather that a reflection of unstable mental illness. We were worned that she will have "a fit" if we ever say no to her.   Principal Problem: Severe recurrent major depression with psychotic features [facility]) Diagnosis:   Patient Active Problem List   Diagnosis Date Noted  . Severe recurrent major depression with psychotic features (HCC) [F33.3] 05/19/2018    Priority: High  . HTN (hypertension) [I10] 05/19/2018  . GERD (gastroesophageal reflux disease) [K21.9] 05/19/2018  . Asthma [J45.909] 05/19/2018  . MDD (major depressive disorder), severe (HCC) [F32.2] 09/09/2017  . Autism spectrum disorder [F84.0] 05/17/2017  . MDD (major depressive disorder), recurrent severe, without psychosis (HCC) [F33.2] 05/16/2017   Total Time spent with patient: 20 minutes  Past Psychiatric History: depression, Asperger's  Past Medical History:  Past Medical History:  Diagnosis Date  . Autism   . GERD (gastroesophageal reflux disease)    . Hypertension     Past Surgical History:  Procedure Laterality Date  . TONSILLECTOMY     Family History: History reviewed. No pertinent family history. Family Psychiatric  History: sister committed suicide Social History:  Social History   Substance and Sexual Activity  Alcohol Use No     Social History   Substance and Sexual Activity  Drug Use No    Social History   Socioeconomic History  . Marital status: Single    Spouse name: Not on file  . Number of children: Not on file  . Years of education: Not on file  . Highest education level: Not on file  Occupational History  . Not on file  Social Needs  . Financial resource strain: Not on file  . Food insecurity:    Worry: Not on file    Inability: Not on file  . Transportation needs:    Medical: Not on file    Non-medical: Not on file  Tobacco Use  . Smoking status: Never Smoker  . Smokeless tobacco: Never Used  Substance and Sexual Activity  . Alcohol use: No  . Drug use: No  . Sexual activity: Never  Lifestyle  . Physical activity:    Days per week: Not on file    Minutes per session: Not on file  . Stress: Not on file  Relationships  . Social connections:    Talks on phone: Not on file    Gets together: Not on file    Attends religious service: Not on file    Active  member of club or organization: Not on file    Attends meetings of clubs or organizations: Not on file    Relationship status: Not on file  Other Topics Concern  . Not on file  Social History Narrative  . Not on file   Additional Social History:                         Sleep: Poor  Appetite:  Fair  Current Medications: Current Facility-Administered Medications  Medication Dose Route Frequency Provider Last Rate Last Dose  . acetaminophen (TYLENOL) tablet 650 mg  650 mg Oral Q6H PRN Clapacs, John T, MD      . albuterol (PROVENTIL HFA;VENTOLIN HFA) 108 (90 Base) MCG/ACT inhaler 2 puff  2 puff Inhalation Q6H PRN Clapacs,  John T, MD      . alum & mag hydroxide-simeth (MAALOX/MYLANTA) 200-200-20 MG/5ML suspension 30 mL  30 mL Oral Q4H PRN Clapacs, John T, MD      . fluticasone (FLONASE) 50 MCG/ACT nasal spray 1 spray  1 spray Each Nare Daily Clapacs, Jackquline DenmarkJohn T, MD   1 spray at 05/20/18 [phone]  . guaiFENesin (ROBITUSSIN) 100 MG/5ML solution 100 mg  5 mL Oral Q6H PRN Clapacs, John T, MD      . hydrOXYzine (ATARAX/VISTARIL) tablet 50 mg  50 mg Oral TID PRN Clapacs, Jackquline DenmarkJohn T, MD   50 mg at 05/19/18 2130  . magnesium hydroxide (MILK OF MAGNESIA) suspension 30 mL  30 mL Oral Daily PRN Clapacs, John T, MD      . pantoprazole (PROTONIX) EC tablet 40 mg  40 mg Oral Daily Clapacs, Jackquline DenmarkJohn T, MD   40 mg at 05/20/18 [id number]  . QUEtiapine (SEROQUEL) tablet 100 mg  100 mg Oral QHS Talis Iwan B, MD   100 mg at 05/19/18 2131  . verapamil (CALAN-SR) CR tablet 240 mg  240 mg Oral QHS Clapacs, Jackquline DenmarkJohn T, MD   240 mg at 05/19/18 2130    Lab Results:  Results for orders placed or performed during the hospital encounter of 05/19/18 (from the past 48 hour(s))  Hemoglobin A1c     Status: None   Collection Time: 05/19/18  7:26 AM  Result Value Ref Range   Hgb A1c MFr Bld 5.2 4.8 - 5.6 %    Comment: (NOTE)         Prediabetes: 5.7 - 6.4         Diabetes: >6.4         Glycemic control for adults with diabetes: <7.0    Mean Plasma Glucose 103 mg/dL    Comment: (NOTE) Performed At: [facility] [address] [place], [place] [id number] Jolene SchimkeNagendra Sanjai MD YN:[phone]   Lipid panel     Status: Abnormal   Collection Time: 05/19/18  7:26 AM  Result Value Ref Range   Cholesterol 106 0 - 200 mg/dL   Triglycerides 64 <657<150 mg/dL   HDL 37 (L) >84>40 mg/dL   Total CHOL/HDL Ratio 2.9 RATIO   VLDL 13 0 - 40 mg/dL   LDL Cholesterol 56 0 - 99 mg/dL    Comment:        Total Cholesterol/HDL:CHD Risk Coronary Heart Disease Risk Table                     Men   Women  1/2 Average Risk   3.4   3.3  Average Risk       5.0  4.4  2 X Average  Risk   9.6   7.1  3 X Average Risk  23.4   11.0        Use the calculated Patient Ratio above and the CHD Risk Table to determine the patient's CHD Risk.        ATP III CLASSIFICATION (LDL):  <100     mg/dL   Optimal  387-564  mg/dL   Near or Above                    Optimal  130-159  mg/dL   Borderline  332-951  mg/dL   High  >884     mg/dL   Very High Performed at [facility], [address] [place], [place] [address]   TSH     Status: None   Collection Time: 05/19/18  7:26 AM  Result Value Ref Range   TSH 2.257 0.350 - 4.500 uIU/mL    Comment: Performed by a 3rd Generation assay with a functional sensitivity of <=0.01 uIU/mL. Performed at [facility], [address] [place], [place] [address]   Glucose, capillary     Status: None   Collection Time: 05/19/18  4:18 PM  Result Value Ref Range   Glucose-Capillary 94 70 - 99 mg/dL   Comment 1 Notify RN   Urinalysis, Complete w Microscopic     Status: Abnormal   Collection Time: 05/19/18  4:59 PM  Result Value Ref Range   Color, Urine YELLOW (A) YELLOW   APPearance HAZY (A) CLEAR   Specific Gravity, Urine 1.021 1.005 - 1.030   pH 5.0 5.0 - 8.0   Glucose, UA NEGATIVE NEGATIVE mg/dL   Hgb urine dipstick SMALL (A) NEGATIVE   Bilirubin Urine NEGATIVE NEGATIVE   Ketones, ur NEGATIVE NEGATIVE mg/dL   Protein, ur NEGATIVE NEGATIVE mg/dL   Nitrite NEGATIVE NEGATIVE   Leukocytes, UA NEGATIVE NEGATIVE   RBC / HPF 0-5 0 - 5 RBC/hpf   WBC, UA 0-5 0 - 5 WBC/hpf   Bacteria, UA RARE (A) NONE SEEN   Squamous Epithelial / LPF 0-5 0 - 5   Mucus PRESENT     Comment: Performed at [facility], [address] [place], [place] [address]  Glucose, capillary     Status: None   Collection Time: 05/20/18  6:35 AM  Result Value Ref Range   Glucose-Capillary 73 70 - 99 mg/dL    Blood Alcohol level:  Lab Results  Component Value Date   ETH <10 05/13/2018    Metabolic Disorder Labs: Lab Results   Component Value Date   HGBA1C 5.2 05/19/2018   MPG 103 05/19/2018   MPG 96.8 09/11/2017   Lab Results  Component Value Date   PROLACTIN 30.2 04/12/2011   Lab Results  Component Value Date   CHOL 106 05/19/2018   TRIG 64 05/19/2018   HDL 37 (L) 05/19/2018   CHOLHDL 2.9 05/19/2018   VLDL 13 05/19/2018   LDLCALC 56 05/19/2018   LDLCALC 53 09/11/2017    Physical Findings: AIMS:  , ,  ,  ,    CIWA:    COWS:     Musculoskeletal: Strength & Muscle Tone: within normal limits Gait & Station: normal Patient leans: N/A  Psychiatric Specialty Exam: Physical Exam  Nursing note and vitals reviewed. Psychiatric: Her affect is blunt. Her speech is delayed. She is slowed and withdrawn. Thought content is paranoid. Cognition and memory are normal. She expresses impulsivity.  Review of Systems  Neurological: Positive for dizziness.  Psychiatric/Behavioral: Positive for depression, hallucinations and suicidal ideas. The patient has insomnia.   All other systems reviewed and are negative.   Blood pressure 104/68, pulse 85, temperature 98.5 F (36.9 C), temperature source Oral, resp. rate 16, height 5\' 5"  (1.651 m), weight 72.6 kg, SpO2 100 %.Body mass index is 26.63 kg/m.  General Appearance: Casual  Eye Contact:  Good  Speech:  Clear and Coherent and Slow  Volume:  Normal  Mood:  Depressed  Affect:  Flat  Thought Process:  Goal Directed and Descriptions of Associations: Intact  Orientation:  Full (Time, Place, and Person)  Thought Content:  Hallucinations: Auditory Command:  to kill herself and Paranoid Ideation  Suicidal Thoughts:  Yes.  without intent/plan  Homicidal Thoughts:  Yes.  without intent/plan  Memory:  Immediate;   Fair Recent;   Fair Remote;   Fair  Judgement:  Poor  Insight:  Lacking  Psychomotor Activity:  Decreased  Concentration:  Concentration: Fair and Attention Span: Fair  Recall:  [employer] of Knowledge:  Fair  Language:  Fair  Akathisia:  No   Handed:  Right  AIMS (if indicated):     Assets:  Communication Skills Desire for Improvement Financial Resources/Insurance Physical Health Resilience Social Support  ADL's:  Intact  Cognition:  WNL  Sleep:  Number of Hours: 7.75     Treatment Plan Summary: Daily contact with patient to assess and evaluate symptoms and progress in treatment and Medication management   Ms. Shayleen is a 22 year old female with a history of depression and mood instability adfmitted for agitation and threatening behavior at the group home and suicidal ideation with a plan to cut herself.  #Suicidal ideation -patient able to contract for safety in the hospital  #Mood and psychosis -continue Abilify manitena 400 mg monthly injections, received her shot this month already -consider increasing Seroquel to address hallucinations -patient believes she should be on the Lithium -she also beleieves she should be on the Zoloft -she is allergic to Prozac, Risperdal and effexor  #Insomnia, resolved with treatment -discontinue Trazodone -continue Seroquel 100 mg nightly  #Asthma -continue inhaler  #GERD -continue Protonix 40 mg daily  #HTN -Verapamil 240 mg daily  #Hypoglycemia -CBG x 3 days  #UTI -culture with mixed species, repeat  #Labs -lipid panel, TSH, A1C are normal -EKG -pregnancy test is negative  #Social -incompetent adult -mother is the guardian  #Dispsotion -discharge back to her group home -follow up with her regular provider  Kristine Linea, MD 05/20/2018, 10:18 AM
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
## 2018-05-19 NOTE — Progress Notes (Signed)
Patient presented to this writer statiing, "I feel sick to my stomach and my vision is a little blurry." Patient told this writer earlier that her vision was blurry due to the fact that she didn't have her glasses. MD notified of acute condition and Abilify 30 mg was discontinued. Patient was given a ginger ale for nausea with some crackers and monitored by staff in day room. VS taken and BP: 136/91 P: 85 T:98.0 O2 sat: 100% on room air. BS- 94 this evening. Patient sat in dayroom until nausea subsided, then assisted to room by this Clinical research associatewriter. Patient stated that she wanted to lie down for awhile. Will continue to monitor and report to the oncoming shift of acute condition.

## 2018-05-19 NOTE — Plan of Care (Signed)
Pt. Able to verbally contract for safety. Pt. Reports she can remain safe while on the unit. Pt. Reports, "I won't try to hurt myself, I promise", "I just don't want to go back to that group home". Pt. Recently admitted and unable to progress in remaining care planning's.  Problem: Safety: Goal: Periods of time without injury will increase Outcome: Progressing   Problem: Safety: Goal: Ability to disclose and discuss suicidal ideas will improve Outcome: Progressing   Problem: Education: Goal: Knowledge of [facility] Education information/materials will improve Outcome: Not Progressing Goal: Emotional status will improve Outcome: Not Progressing Goal: Mental status will improve Outcome: Not Progressing Goal: Verbalization of understanding the information provided will improve Outcome: Not Progressing   Problem: Activity: Goal: Interest or engagement in activities will improve Outcome: Not Progressing Goal: Sleeping patterns will improve Outcome: Not Progressing   Problem: Coping: Goal: Ability to verbalize frustrations and anger appropriately will improve Outcome: Not Progressing Goal: Ability to demonstrate self-control will improve Outcome: Not Progressing   Problem: Health Behavior/Discharge Planning: Goal: Identification of resources available to assist in meeting health care needs will improve Outcome: Not Progressing Goal: Compliance with treatment plan for underlying cause of condition will improve Outcome: Not Progressing   Problem: Physical Regulation: Goal: Ability to maintain clinical measurements within normal limits will improve Outcome: Not Progressing   Problem: Education: Goal: Utilization of techniques to improve thought processes will improve Outcome: Not Progressing Goal: Knowledge of the prescribed therapeutic regimen will improve Outcome: Not Progressing   Problem: Activity: Goal: Interest or engagement in leisure activities will  improve Outcome: Not Progressing Goal: Imbalance in normal sleep/wake cycle will improve Outcome: Not Progressing   Problem: Coping: Goal: Coping ability will improve Outcome: Not Progressing Goal: Will verbalize feelings Outcome: Not Progressing   Problem: Health Behavior/Discharge Planning: Goal: Ability to make decisions will improve Outcome: Not Progressing Goal: Compliance with therapeutic regimen will improve Outcome: Not Progressing   Problem: Role Relationship: Goal: Will demonstrate positive changes in social behaviors and relationships Outcome: Not Progressing   Problem: Safety: Goal: Ability to identify and utilize support systems that promote safety will improve Outcome: Not Progressing   Problem: Self-Concept: Goal: Will verbalize positive feelings about self Outcome: Not Progressing Goal: Level of anxiety will decrease Outcome: Not Progressing   Problem: Education: Goal: Ability to incorporate positive changes in behavior to improve self-esteem will improve Outcome: Not Progressing   Problem: Health Behavior/Discharge Planning: Goal: Ability to identify and utilize available resources and services will improve Outcome: Not Progressing Goal: Ability to remain free from injury will improve Outcome: Not Progressing   Problem: Self-Concept: Goal: Will verbalize positive feelings about self Outcome: Not Progressing   Problem: Skin Integrity: Goal: Demonstration of wound healing without infection will improve Outcome: Not Progressing   Problem: Education: Goal: Ability to make informed decisions regarding treatment will improve Outcome: Not Progressing   Problem: Coping: Goal: Coping ability will improve Outcome: Not Progressing   Problem: Health Behavior/Discharge Planning: Goal: Identification of resources available to assist in meeting health care needs will improve Outcome: Not Progressing   Problem: Medication: Goal: Compliance with  prescribed medication regimen will improve Outcome: Not Progressing   Problem: Self-Concept: Goal: Ability to disclose and discuss suicidal ideas will improve Outcome: Not Progressing Goal: Will verbalize positive feelings about self Outcome: Not Progressing

## 2018-05-19 NOTE — Progress Notes (Addendum)
Recreation Therapy Notes  Date: 05/19/2018  Time: 9:30 am  Location: Craft Room  Behavioral response: Appropriate    Intervention Topic: Happiness  Discussion/Intervention:  Group content today was focused on Happiness. The group defined happiness and stated reasons they are and are not happy at times. Participants identified reasons they are normally happy and why. Individuals expressed how not being happy affects themselves and others. Patients stated reasons why happiness is important to them. The group described how they feel when they are happy. Individuals participated in the intervention "What is happiness" where they defined what happiness means to them.  Clinical Observations/Feedback:  Patient came to group and identified  metal music as something that makes her happy. She stated that being depressed is a reason some people are not happy. Individual was social with peers and staff while participating in the intervention.  Kaycen Whitworth LRT/CTRS         Vivi Piccirilli 05/19/2018 12:56 PM
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
## 2018-05-19 NOTE — Progress Notes (Signed)
D: [facility]. Patient skin assessment completed with Jon GillsAlexis, RN, skin is intact, no contraband found @0040 . Pt. Was admitted under the services of, Dr. Toni Amendlapacs. Pt. Arrived on to the unit @0037 . Pt. Cooperative and appropriate during admissions process. Pt. Able to engage appropriately during the interviewing process. Pt. Given fluids to drink and offered a meal, but declined meal. Pt. Reports this early AM that she is depressed and anxious about being bullied at her group home that she lives at. Pt. Reports anxiety and depression high and endorses passive suicidal thoughts and homicidal thoughts towards, "a person at my group home that's bullying me". Pt. Does not elaborate on the person she is angry towards at her group home. Pt. When asked about thoughts of suicide directly, reports that she only has thoughts of suicide when, "I think about going back to that group home". Pt. Verbally is able to contract for safety on the unit. Pt. Affect is appropriate, smiles occasionally and is pleasant. Pt. Reports she can remain safe while on the unit. Pt. When creating her treatment plan reports that she would very much like to be placed in another group home, because all of these feelings are from being bullied. Pt. Denies AVH. No observations of responding to internal stimuli. Pt. Given extensive admissions education.    A: Patient oriented to unit/room/call light.Patient was encourage to participate in unit activities and continue with plan of care. Q x 15 minute observation checks were initiated for safety. MD monitoring for safety per nursing judgement appropriate given assessments interviewing.   R: Patient is receptive to treatment plan initiated and safety monitoring by staff will be implemented per MD orders.

## 2018-05-19 NOTE — Plan of Care (Signed)
Patient alert and oriented to self, place and time. Verbalized to this writer, "I don't feel like I want to hurt myself or anybody here but if I have to go back to the group home I might hurt someone there." Patient present in the milieu interacting appropriately with her peers. Compliant with her treatment regimen, meals eaten in the dayroom. Milieu remains safe with q 15 minute safety checks.

## 2018-05-19 NOTE — B[facility] Suicide Risk Assessment (Signed)
[facility]   Nursing information obtained from:  Patient, Review of record Demographic factors:  Adolescent or young adult, Caucasian, Unemployed Current Mental Status:  Suicidal ideation indicated by patient Loss Factors:  NA Historical Factors:  NA Risk Reduction Factors:  Positive social support, Sense of responsibility to family, Positive coping skills or problem solving skills  Total Time spent with patient: 1 hour Principal Problem: Severe recurrent major depression with psychotic features (HCC) Diagnosis:   Patient Active Problem List   Diagnosis Date Noted  . Severe recurrent major depression with psychotic features (HCC) [F33.3] 05/19/2018    Priority: High  . HTN (hypertension) [I10] 05/19/2018  . GERD (gastroesophageal reflux disease) [K21.9] 05/19/2018  . Asthma [J45.909] 05/19/2018  . MDD (major depressive disorder), severe (HCC) [F32.2] 09/09/2017  . Autism spectrum disorder [F84.0] 05/17/2017  . MDD (major depressive disorder), recurrent severe, without psychosis (HCC) [F33.2] 05/16/2017   Subjective Data: suicidal ideation  Continued Clinical Symptoms:  Alcohol Use Disorder Identification Test Final Score (AUDIT): 0 The "Alcohol Use Disorders Identification Test", Guidelines for Use in Primary Care, Second Edition.  World Science writerHealth Organization [facility](WHO). Score between 0-7:  no or low risk or alcohol related problems. Score between 8-15:  moderate risk of alcohol related problems. Score between 16-19:  high risk of alcohol related problems. Score 20 or above:  warrants further diagnostic evaluation for alcohol dependence and treatment.   CLINICAL FACTORS:   Depression:   Impulsivity Insomnia More than one psychiatric diagnosis Currently Psychotic Unstable or Poor Therapeutic Relationship Previous Psychiatric Diagnoses and Treatments   Musculoskeletal: Strength & Muscle Tone: within normal limits Gait & Station: normal Patient leans:  N/A  Psychiatric Specialty Exam: Physical Exam  Nursing note and vitals reviewed. Psychiatric: Her affect is blunt. Her speech is delayed. She is slowed, withdrawn and actively hallucinating. Thought content is paranoid. Cognition and memory are normal. She expresses impulsivity. She expresses suicidal ideation. She expresses suicidal plans.    Review of Systems  Neurological: Positive for dizziness.  Psychiatric/Behavioral: Positive for depression, hallucinations and suicidal ideas. The patient has insomnia.   All other systems reviewed and are negative.   Blood pressure 123/87, pulse 95, temperature 97.8 F (36.6 C), temperature source Oral, resp. rate 18, height 5\' 5"  (1.651 m), weight 72.6 kg, SpO2 100 %.Body mass index is 26.63 kg/m.  General Appearance: Casual  Eye Contact:  Good  Speech:  Clear and Coherent and Slow  Volume:  Increased  Mood:  Depressed  Affect:  Flat  Thought Process:  Goal Directed and Descriptions of Associations: Intact  Orientation:  Full (Time, Place, and Person)  Thought Content:  Hallucinations: Auditory Command:  to do stuff and Paranoid Ideation  Suicidal Thoughts:  Yes.  with intent/plan  Homicidal Thoughts:  No  Memory:  Immediate;   Fair Recent;   Fair Remote;   Fair  Judgement:  Poor  Insight:  Lacking  Psychomotor Activity:  Psychomotor Retardation  Concentration:  Concentration: Fair and Attention Span: Fair  Recall:  [employer]  Fund of Knowledge:  Fair  Language:  Fair  Akathisia:  No  Handed:  Right  AIMS (if indicated):     Assets:  Communication Skills Desire for Improvement Financial Resources/Insurance Physical Health Resilience Social Support  ADL's:  Intact  Cognition:  WNL  Sleep:  Number of Hours: 2.15      COGNITIVE FEATURES THAT CONTRIBUTE TO RISK:  None    SUICIDE RISK:   Moderate:  Frequent suicidal ideation  with limited intensity, and duration, some specificity in terms of plans, no associated intent, good  self-control, limited dysphoria/symptomatology, some risk factors present, and identifiable protective factors, including available and accessible social support.  PLAN OF CARE: hospital admission, medication management, discharge planning.  Ms. Laquiesha is a 22 year old female with a history of depression and mood instability adfmitted for agitation and threatening behavior at the group home and suicidal ideation with a plan to cut herself.  #Suicidal ideation -patient able to contract for safety in the hospital  #Mood and psychosis -could not reach the guardian -reportedly patient is on Abilify manitena injections and received her shot this month already -complains of dizziness and blurred vision from oral Abilify started in ER -she would like to start Seroquel for depression, anxiety, sleep and mood stabilization -patient believes she should be on the Lithium -she also beleieves she should be on the Zoloft -she is allergic to Prozac, Risperdal and effexor  #Insomnia, -slept 2 hours with Trazodone -start Seroquel 100 mg nightly  #Asthma -continue inhaler  #GERD -continue Protonix 40 mg daily  #HTN -Verapamil 240 mg daily  #Hypoglycemia -CBG x 3 days  #UTI -culture with mixed species, repeat  #Labs -lipid panel, TSH, A1C -EKG -pregnancy test  #Social -incompetent adult -mother is the guardian  #Dispsotion -unclear if allowed to return to the group home -wishes for new placement -follow up [facility]    I certify that inpatient services furnished can reasonably be expected to improve the patient's condition.   Kristine Linea, MD 05/19/2018, 3:41 PM

## 2018-05-19 NOTE — BHH Group Notes (Signed)
BHH Group Notes:  (Nursing/MHT/Case Management/Adjunct)  Date:  05/19/2018  Time:  10:43 PM  Type of Therapy:  Group Therapy  Participation Level:  Did Not Attend   Jinger NeighborsKeith D Urian Martenson 05/19/2018, 10:43 PM

## 2018-05-19 NOTE — BHH Group Notes (Signed)
05/19/2018 1PM  Type of Therapy/Topic:  Group Therapy:  Feelings about Diagnosis  Participation Level:  Active   Description of Group:   This group will allow patients to explore their thoughts and feelings about diagnoses they have received. Patients will be guided to explore their level of understanding and acceptance of these diagnoses. Facilitator will encourage patients to process their thoughts and feelings about the reactions of others to their diagnosis and will guide patients in identifying ways to discuss their diagnosis with significant others in their lives. This group will be process-oriented, with patients participating in exploration of their own experiences, giving and receiving support, and processing challenge from other group members.   Therapeutic Goals: 1. Patient will demonstrate understanding of diagnosis as evidenced by identifying two or more symptoms of the disorder 2. Patient will be able to express two feelings regarding the diagnosis 3. Patient will demonstrate their ability to communicate their needs through discussion and/or role play  Summary of Patient Progress: Actively and appropriately engaged in the group. Patient was able to provide support and validation to other group members.Patient practiced active listening when interacting with the facilitator and other group members. Brittany Palmer spoke about having a diagnosis of being autistic. She spoke about having issues with aggression and being impulsive. [name] volunteered in the group activity. Patient is still in the process of obtaining treatment goals.        Therapeutic Modalities:   Cognitive Behavioral Therapy Brief Therapy Feelings Identification    Johny ShearsCassandra  Laba, LCSW 05/19/2018 2:23 PM

## 2018-05-19 NOTE — Progress Notes (Signed)
Recreation Therapy Notes  INPATIENT RECREATION THERAPY ASSESSMENT  Patient Details Name: Orvan JulyMacy Kanzler MRN: [id number] DOB: February 15, 1996 Today's Date: 05/19/2018       Information Obtained From: Patient  Able to Participate in Assessment/Interview: Yes  Patient Presentation: Responsive  Reason for Admission (Per Patient): Suicidal Ideation, Other (Comments)(Being Bullied)  Patient Stressors:    Coping Skills:   Music, Other (Comment)(Breaking sticks)  Leisure Interests (2+):  Sports - Dance, Music - Listen, Individual - TV, Art - Coloring  Frequency of Recreation/Participation: Monthly  Awareness of Community Resources:     [employer]Community Resources:     Current Use:    If no, Barriers?:    Expressed Interest in [employer] Street CorporationCommunity Resource Information:    [place]  Patient Main Form of Transportation: Other (Comment)(Group home)  Patient Strengths:  Overcoming things  Patient Identified Areas of Improvement:  Not get easily agitated  Patient Goal for Hospitalization:  Find another group home  Current SI (including self-harm):  Yes(Verbal contract for safety)  Current HI:  Yes(Some one in the group home)  Current AVH: No  Staff Intervention Plan: Group Attendance, Collaborate with Interdisciplinary Treatment Team  Consent to Intern Participation: N/A  Shebra Muldrow 05/19/2018, 2:00 PM

## 2018-05-19 NOTE — Progress Notes (Signed)
Precautionary checks every 15 minutes for safety maintained, room free of safety hazards, patient sustains no injury or falls during this shift. Will endorse care to next shift.   

## 2018-05-20 ENCOUNTER — Inpatient Hospital Stay: Payer: BLUE CROSS/BLUE SHIELD

## 2018-05-20 LAB — GLUCOSE, CAPILLARY
Glucose-Capillary: 73 mg/dL (ref 70–99)
Glucose-Capillary: 88 mg/dL (ref 70–99)

## 2018-05-20 LAB — HEMOGLOBIN A1C
Hgb A1c MFr Bld: 5.2 % (ref 4.8–5.6)
Mean Plasma Glucose: 103 mg/dL

## 2018-05-20 MED ORDER — QUETIAPINE FUMARATE 200 MG PO TABS
200.0000 mg | ORAL_TABLET | Freq: Every day | ORAL | Status: DC
  Administered 2018-05-20: 200 mg via ORAL
  Filled 2018-05-20: qty 1

## 2018-05-20 MED ORDER — ALBUTEROL SULFATE (2.5 MG/3ML) 0.083% IN NEBU
2.5000 mg | INHALATION_SOLUTION | Freq: Four times a day (QID) | RESPIRATORY_TRACT | Status: DC | PRN
  Administered 2018-05-21: 2.5 mg via RESPIRATORY_TRACT
  Filled 2018-05-20: qty 3

## 2018-05-20 NOTE — Plan of Care (Signed)
Emotional and mental status  improved . Information given in concrete form  for better understanding  Attending unit programing  Voice no concerns around sleep . Working on Pharmacologistcoping skills  No issues around  control  or outburst . No concerns around safety. Attending  unit  programing .   Problem: Coping: Goal: Ability to demonstrate self-control will improve Outcome: Progressing   Problem: Education: Goal: Emotional status will improve Outcome: Progressing Goal: Mental status will improve Outcome: Progressing Goal: Verbalization of understanding the information provided will improve Outcome: Progressing   Problem: Activity: Goal: Interest or engagement in activities will improve Outcome: Progressing Goal: Sleeping patterns will improve Outcome: Progressing   Problem: Coping: Goal: Ability to verbalize frustrations and anger appropriately will improve Outcome: Progressing Goal: Ability to demonstrate self-control will improve Outcome: Progressing   Problem: Health Behavior/Discharge Planning: Goal: Identification of resources available to assist in meeting health care needs will improve Outcome: Progressing Goal: Compliance with treatment plan for underlying cause of condition will improve Outcome: Progressing   Problem: Physical Regulation: Goal: Ability to maintain clinical measurements within normal limits will improve Outcome: Progressing   Problem: Safety: Goal: Periods of time without injury will increase Outcome: Progressing   Problem: Education: Goal: Utilization of techniques to improve thought processes will improve Outcome: Progressing Goal: Knowledge of the prescribed therapeutic regimen will improve Outcome: Progressing   Problem: Activity: Goal: Interest or engagement in leisure activities will improve Outcome: Progressing Goal: Imbalance in normal sleep/wake cycle will improve Outcome: Progressing   Problem: Coping: Goal: Coping ability will  improve Outcome: Progressing Goal: Will verbalize feelings Outcome: Progressing   Problem: Health Behavior/Discharge Planning: Goal: Ability to make decisions will improve Outcome: Progressing Goal: Compliance with therapeutic regimen will improve Outcome: Progressing   Problem: Role Relationship: Goal: Will demonstrate positive changes in social behaviors and relationships Outcome: Progressing   Problem: Safety: Goal: Ability to disclose and discuss suicidal ideas will improve Outcome: Progressing Goal: Ability to identify and utilize support systems that promote safety will improve Outcome: Progressing   Problem: Self-Concept: Goal: Will verbalize positive feelings about self Outcome: Progressing Goal: Level of anxiety will decrease Outcome: Progressing   Problem: Education: Goal: Ability to incorporate positive changes in behavior to improve self-esteem will improve Outcome: Progressing   Problem: Health Behavior/Discharge Planning: Goal: Ability to identify and utilize available resources and services will improve Outcome: Progressing Goal: Ability to remain free from injury will improve Outcome: Progressing   Problem: Self-Concept: Goal: Will verbalize positive feelings about self Outcome: Progressing   Problem: Skin Integrity: Goal: Demonstration of wound healing without infection will improve Outcome: Progressing   Problem: Education: Goal: Ability to make informed decisions regarding treatment will improve Outcome: Progressing   Problem: Coping: Goal: Coping ability will improve Outcome: Progressing   Problem: Health Behavior/Discharge Planning: Goal: Identification of resources available to assist in meeting health care needs will improve Outcome: Progressing   Problem: Medication: Goal: Compliance with prescribed medication regimen will improve Outcome: Progressing   Problem: Self-Concept: Goal: Ability to disclose and discuss suicidal ideas  will improve Outcome: Progressing Goal: Will verbalize positive feelings about self Outcome: Progressing   Problem: Safety: Goal: Ability to remain free from injury will improve Outcome: Progressing

## 2018-05-20 NOTE — BHH Group Notes (Signed)
BHH Group Notes:  (Nursing/MHT/Case Management/Adjunct)  Date:  05/20/2018  Time:  9:38 PM  Type of Therapy:  Group Therapy  Participation Level:  Active  Participation Quality:  Appropriate  Affect:  Appropriate  Cognitive:  Alert  Insight:  Good  Engagement in Group:  Engaged  Modes of Intervention:  Support  Summary of Progress/Problems:  [name] 05/20/2018, 9:38 PM

## 2018-05-20 NOTE — Progress Notes (Signed)
D: Patient stated slept good last night .Stated appetite is good and energy level  Is normal. Stated concentration is good . S Denies suicidal  HomicidalEmotional and mental status  improved . Information given in concrete form  for better understanding  Attending unit programing  Voice no concerns around sleep . Working on Pharmacologistcoping skills  No issues around  control  or outburst . No concerns around safety. Attending  unit  programing .  ideations  .  No auditory hallucinations  No pain concerns . Appropriate ADL'S. Interacting with peers and staff.  A: Encourage patient participation with unit programming . Instruction  Given on  Medication , verbalize understanding. R: Voice no other concerns. Staff continue to monitor

## 2018-05-20 NOTE — Progress Notes (Signed)
Patient back from radiology at this time. Will continue to monitor per routine orders for safety.

## 2018-05-20 NOTE — Progress Notes (Signed)
Patient off unit with security and behavioral health staff at radiology for testing.

## 2018-05-20 NOTE — Progress Notes (Signed)
[facility] MD Progress Note  05/21/2018 11:42 AM Brittany Palmer  MRN:  [id number]  Subjective:    Brittany Palmer feels better but still has "thoughts" in her head that she refuses to call voices. She still feels that she could hurt a peer at the group home upon return. Last night she developed a cough that continues. Chest Xray was negative. She did not receive nebulizer treatment. Will start Dulera.   Principal Problem: Severe recurrent major depression with psychotic features [facility]) Diagnosis:   Patient Active Problem List   Diagnosis Date Noted  . Severe recurrent major depression with psychotic features (HCC) [F33.3] 05/19/2018    Priority: High  . HTN (hypertension) [I10] 05/19/2018  . GERD (gastroesophageal reflux disease) [K21.9] 05/19/2018  . Asthma [J45.909] 05/19/2018  . MDD (major depressive disorder), severe (HCC) [F32.2] 09/09/2017  . Autism spectrum disorder [F84.0] 05/17/2017  . MDD (major depressive disorder), recurrent severe, without psychosis (HCC) [F33.2] 05/16/2017   Total Time spent with patient: 20 minutes  Past Psychiatric History: schizoaffective disorder  Past Medical History:  Past Medical History:  Diagnosis Date  . Autism   . GERD (gastroesophageal reflux disease)   . Hypertension     Past Surgical History:  Procedure Laterality Date  . TONSILLECTOMY     Family History: History reviewed. No pertinent family history. Family Psychiatric  History: sister committed suicide Social History:  Social History   Substance and Sexual Activity  Alcohol Use No     Social History   Substance and Sexual Activity  Drug Use No    Social History   Socioeconomic History  . Marital status: Single    Spouse name: Not on file  . Number of children: Not on file  . Years of education: Not on file  . Highest education level: Not on file  Occupational History  . Not on file  Social Needs  . Financial resource strain: Not on file  . Food insecurity:    Worry: Not on  file    Inability: Not on file  . Transportation needs:    Medical: Not on file    Non-medical: Not on file  Tobacco Use  . Smoking status: Never Smoker  . Smokeless tobacco: Never Used  Substance and Sexual Activity  . Alcohol use: No  . Drug use: No  . Sexual activity: Never  Lifestyle  . Physical activity:    Days per week: Not on file    Minutes per session: Not on file  . Stress: Not on file  Relationships  . Social connections:    Talks on phone: Not on file    Gets together: Not on file    Attends religious service: Not on file    Active member of club or organization: Not on file    Attends meetings of clubs or organizations: Not on file    Relationship status: Not on file  Other Topics Concern  . Not on file  Social History Narrative  . Not on file   Additional Social History:                         Sleep: Fair  Appetite:  Fair  Current Medications: Current Facility-Administered Medications  Medication Dose Route Frequency Provider Last Rate Last Dose  . acetaminophen (TYLENOL) tablet 650 mg  650 mg Oral Q6H PRN Clapacs, John T, MD      . albuterol (PROVENTIL HFA;VENTOLIN HFA) 108 (90 Base) MCG/ACT inhaler 2 puff  2 puff Inhalation Q6H PRN Clapacs, Jackquline Denmark, MD   2 puff at 05/21/18 0816  . albuterol (PROVENTIL) (2.5 MG/3ML) 0.083% nebulizer solution 2.5 mg  2.5 mg Nebulization Q6H PRN Moselle Rister B, MD      . alum & mag hydroxide-simeth (MAALOX/MYLANTA) 200-200-20 MG/5ML suspension 30 mL  30 mL Oral Q4H PRN Clapacs, John T, MD      . guaiFENesin (ROBITUSSIN) 100 MG/5ML solution 100 mg  5 mL Oral Q6H PRN Clapacs, Jackquline Denmark, MD   100 mg at 05/21/18 0816  . hydrOXYzine (ATARAX/VISTARIL) tablet 50 mg  50 mg Oral TID PRN Clapacs, Jackquline Denmark, MD   50 mg at 05/19/18 2130  . magnesium hydroxide (MILK OF MAGNESIA) suspension 30 mL  30 mL Oral Daily PRN Clapacs, John T, MD      . mometasone-formoterol (DULERA) 100-5 MCG/ACT inhaler 2 puff  2 puff Inhalation BID  Izel Hochberg B, MD      . pantoprazole (PROTONIX) EC tablet 40 mg  40 mg Oral Daily Clapacs, Jackquline Denmark, MD   40 mg at 05/21/18 0816  . QUEtiapine (SEROQUEL) tablet 300 mg  300 mg Oral QHS Deaaron Fulghum B, MD      . verapamil (CALAN-SR) CR tablet 240 mg  240 mg Oral QHS Clapacs, Jackquline Denmark, MD   240 mg at 05/20/18 2143    Lab Results:  Results for orders placed or performed during the hospital encounter of 05/19/18 (from the past 48 hour(s))  Glucose, capillary     Status: None   Collection Time: 05/19/18  4:18 PM  Result Value Ref Range   Glucose-Capillary 94 70 - 99 mg/dL   Comment 1 Notify RN   Urinalysis, Complete w Microscopic     Status: Abnormal   Collection Time: 05/19/18  4:59 PM  Result Value Ref Range   Color, Urine YELLOW (A) YELLOW   APPearance HAZY (A) CLEAR   Specific Gravity, Urine 1.021 1.005 - 1.030   pH 5.0 5.0 - 8.0   Glucose, UA NEGATIVE NEGATIVE mg/dL   Hgb urine dipstick SMALL (A) NEGATIVE   Bilirubin Urine NEGATIVE NEGATIVE   Ketones, ur NEGATIVE NEGATIVE mg/dL   Protein, ur NEGATIVE NEGATIVE mg/dL   Nitrite NEGATIVE NEGATIVE   Leukocytes, UA NEGATIVE NEGATIVE   RBC / HPF 0-5 0 - 5 RBC/hpf   WBC, UA 0-5 0 - 5 WBC/hpf   Bacteria, UA RARE (A) NONE SEEN   Squamous Epithelial / LPF 0-5 0 - 5   Mucus PRESENT     Comment: Performed at [facility], [address] [place], [place] [address]  Urine Culture     Status: Abnormal   Collection Time: 05/19/18  4:59 PM  Result Value Ref Range   Specimen Description      URINE, RANDOM Performed at [facility], [address], [place], [place] [address]    Special Requests      NONE Performed at [facility], [address] [place], [place] [address]    Culture MULTIPLE SPECIES PRESENT, SUGGEST RECOLLECTION (A)    Report Status 05/21/2018 FINAL   Glucose, capillary     Status: None   Collection Time: 05/20/18  6:35 AM  Result Value Ref Range   Glucose-Capillary 73 70 - 99  mg/dL  Glucose, capillary     Status: None   Collection Time: 05/20/18  5:31 PM  Result Value Ref Range   Glucose-Capillary 88 70 - 99 mg/dL   Comment 1  Document in Chart     Blood Alcohol level:  Lab Results  Component Value Date   ETH <10 05/13/2018    Metabolic Disorder Labs: Lab Results  Component Value Date   HGBA1C 5.2 05/19/2018   MPG 103 05/19/2018   MPG 96.8 09/11/2017   Lab Results  Component Value Date   PROLACTIN 30.2 04/12/2011   Lab Results  Component Value Date   CHOL 106 05/19/2018   TRIG 64 05/19/2018   HDL 37 (L) 05/19/2018   CHOLHDL 2.9 05/19/2018   VLDL 13 05/19/2018   LDLCALC 56 05/19/2018   LDLCALC 53 09/11/2017    Physical Findings: AIMS:  , ,  ,  ,    CIWA:    COWS:     Musculoskeletal: Strength & Muscle Tone: within normal limits Gait & Station: normal Patient leans: N/A  Psychiatric Specialty Exam: Physical Exam  Nursing note and vitals reviewed. Psychiatric: Her affect is blunt. Her speech is delayed. She is withdrawn and actively hallucinating. Thought content is delusional. Cognition and memory are normal. She expresses impulsivity.    Review of Systems  Neurological: Negative.   Psychiatric/Behavioral: Positive for hallucinations.  All other systems reviewed and are negative.   Blood pressure 122/78, pulse (!) 113, temperature 97.9 F (36.6 C), temperature source Oral, resp. rate 18, height 5\' 5"  (1.651 m), weight 72.6 kg, SpO2 100 %.Body mass index is 26.63 kg/m.  General Appearance: Casual  Eye Contact:  Good  Speech:  Clear and Coherent and Slow  Volume:  Normal  Mood:  Euthymic  Affect:  Flat  Thought Process:  Goal Directed and Descriptions of Associations: Intact  Orientation:  Full (Time, Place, and Person)  Thought Content:  Delusions and Hallucinations: Auditory  Suicidal Thoughts:  No  Homicidal Thoughts:  Yes.  with intent/plan  Memory:  Immediate;   Fair Recent;   Fair Remote;   Fair  Judgement:  Poor   Insight:  Lacking  Psychomotor Activity:  Decreased  Concentration:  Concentration: Fair and Attention Span: Fair  Recall:  [employer]  Fund of Knowledge:  Fair  Language:  Fair  Akathisia:  No  Handed:  Right  AIMS (if indicated):     Assets:  Communication Skills Desire for Improvement Financial Resources/Insurance Housing Physical Health Resilience Social Support  ADL's:  Intact  Cognition:  WNL  Sleep:  Number of Hours: 6.15     Treatment Plan Summary: Daily contact with patient to assess and evaluate symptoms and progress in treatment and Medication management   Brittany Palmer is a 22 year old female with a history of depression and mood instability adfmitted for agitation and threatening behavior at the group home and suicidal ideation with a plan to cut herself.  #Suicidal ideation -patient able to contract for safety in the hospital  #Mood and psychosis -continue Abilify manitena 400 mg monthly injections, received her shot this month already -increase Seroquel to 300 mg nightly to address hallucinations, this is a target dose -patient believes she should be on the Lithium -she also beleieves she should be on the Zoloft -she is allergic to Prozac, Risperdal and Effexor  #Insomnia, resolved with treatment -discontinue Trazodone -continue Seroquel   #Asthma, new onset cough -continue inhaler -start albuterol nebulizer PRN -start Dulara daily  -chest Xray is negative  #GERD -continue Protonix 40 mg daily  #HTN -Verapamil 240 mg daily  #H/O hypoglycemia -CBG normal  #UTI -culture with mixed species, repeat  #Labs -lipid panel, TSH, A1C are normal -EKG reviewed,  NSR with QTc 437 -pregnancy test is negative  #Social -incompetent adult -mother is the guardian  #Dispsotion -discharge back to her group home -follow up with her regular provider  Kristine Linea, MD 05/21/2018, 11:42 AM

## 2018-05-20 NOTE — Tx Team (Addendum)
Interdisciplinary Treatment and Diagnostic Plan Update  05/20/2018 Time of Session: 10:30 AM Brittany Palmer MRN: [id number]  Principal Diagnosis: Severe recurrent major depression with psychotic features [facility](HCC)  Secondary Diagnoses: Principal Problem:   Severe recurrent major depression with psychotic features (HCC) Active Problems:   Autism spectrum disorder   HTN (hypertension)   GERD (gastroesophageal reflux disease)   Asthma   Current Medications:  Current Facility-Administered Medications  Medication Dose Route Frequency Provider Last Rate Last Dose  . acetaminophen (TYLENOL) tablet 650 mg  650 mg Oral Q6H PRN Clapacs, John T, MD      . albuterol (PROVENTIL HFA;VENTOLIN HFA) 108 (90 Base) MCG/ACT inhaler 2 puff  2 puff Inhalation Q6H PRN Clapacs, John T, MD      . alum & mag hydroxide-simeth (MAALOX/MYLANTA) 200-200-20 MG/5ML suspension 30 mL  30 mL Oral Q4H PRN Clapacs, John T, MD      . fluticasone (FLONASE) 50 MCG/ACT nasal spray 1 spray  1 spray Each Nare Daily Clapacs, Jackquline DenmarkJohn T, MD   1 spray at 05/20/18 [phone]  . guaiFENesin (ROBITUSSIN) 100 MG/5ML solution 100 mg  5 mL Oral Q6H PRN Clapacs, John T, MD      . hydrOXYzine (ATARAX/VISTARIL) tablet 50 mg  50 mg Oral TID PRN Clapacs, Jackquline DenmarkJohn T, MD   50 mg at 05/19/18 2130  . magnesium hydroxide (MILK OF MAGNESIA) suspension 30 mL  30 mL Oral Daily PRN Clapacs, John T, MD      . pantoprazole (PROTONIX) EC tablet 40 mg  40 mg Oral Daily Clapacs, Jackquline DenmarkJohn T, MD   40 mg at 05/20/18 [id number]  . QUEtiapine (SEROQUEL) tablet 100 mg  100 mg Oral QHS Pucilowska, Jolanta B, MD   100 mg at 05/19/18 2131  . verapamil (CALAN-SR) CR tablet 240 mg  240 mg Oral QHS Clapacs, Jackquline DenmarkJohn T, MD   240 mg at 05/19/18 2130   PTA Medications: Medications Prior to Admission  Medication Sig Dispense Refill Last Dose  . albuterol (PROVENTIL HFA;VENTOLIN HFA) 108 (90 Base) MCG/ACT inhaler Inhale 2 puffs into the lungs every 6 (six) hours as needed for wheezing or shortness of  breath.    09/09/2017  . ARIPiprazole (ABILIFY) 20 MG tablet Take 1 tablet (20 mg total) by mouth daily. 30 tablet 0   . escitalopram (LEXAPRO) 10 MG tablet Take 1 tablet (10 mg total) by mouth daily. 30 tablet 0   . fluticasone (FLONASE) 50 MCG/ACT nasal spray Place 1 spray into both nostrils daily.   09/10/2017  . hydrochlorothiazide (MICROZIDE) 12.5 MG capsule Take 12.5 mg by mouth daily.   09/10/2017  . hydrOXYzine (ATARAX/VISTARIL) 25 MG tablet Take 1 tablet (25 mg total) by mouth 3 (three) times daily as needed for anxiety. 30 tablet 0 09/10/2017  . losartan (COZAAR) 25 MG tablet Take 25 mg by mouth daily.   09/10/2017  . pantoprazole (PROTONIX) 40 MG tablet Take 1 tablet (40 mg total) by mouth daily. 30 tablet 0 09/10/2017  . traZODone (DESYREL) 100 MG tablet Take 1 tablet (100 mg total) by mouth at bedtime as needed for sleep. 30 tablet 0   . verapamil (CALAN-SR) 240 MG CR tablet Take 240 mg by mouth at bedtime.   09/10/2017    Patient Stressors: Other: Group home resident bullying   Patient Strengths: Ability for insight Active sense of humor Communication skills Motivation for treatment/growth Physical Health Special hobby/interest Supportive family/friends  Treatment Modalities: Medication Management, Group therapy, Case management,  1 to 1 session with  clinician, Psychoeducation, Recreational therapy.   Physician Treatment Plan for Primary Diagnosis: Severe recurrent major depression with psychotic features (HCC) Long Term Goal(s): Improvement in symptoms so as ready for discharge Improvement in symptoms so as ready for discharge   Short Term Goals: Ability to identify changes in lifestyle to reduce recurrence of condition will improve Ability to verbalize feelings will improve Ability to disclose and discuss suicidal ideas Ability to demonstrate self-control will improve Ability to identify and develop effective coping behaviors will improve Ability to maintain clinical measurements  within normal limits will improve Ability to identify triggers associated with substance abuse/mental health issues will improve NA  Medication Management: Evaluate patient's response, side effects, and tolerance of medication regimen.  Therapeutic Interventions: 1 to 1 sessions, Unit Group sessions and Medication administration.  Evaluation of Outcomes: Progressing  Physician Treatment Plan for Secondary Diagnosis: Principal Problem:   Severe recurrent major depression with psychotic features (HCC) Active Problems:   Autism spectrum disorder   HTN (hypertension)   GERD (gastroesophageal reflux disease)   Asthma  Long Term Goal(s): Improvement in symptoms so as ready for discharge Improvement in symptoms so as ready for discharge   Short Term Goals: Ability to identify changes in lifestyle to reduce recurrence of condition will improve Ability to verbalize feelings will improve Ability to disclose and discuss suicidal ideas Ability to demonstrate self-control will improve Ability to identify and develop effective coping behaviors will improve Ability to maintain clinical measurements within normal limits will improve Ability to identify triggers associated with substance abuse/mental health issues will improve NA     Medication Management: Evaluate patient's response, side effects, and tolerance of medication regimen.  Therapeutic Interventions: 1 to 1 sessions, Unit Group sessions and Medication administration.  Evaluation of Outcomes: Progressing   RN Treatment Plan for Primary Diagnosis: Severe recurrent major depression with psychotic features (HCC) Long Term Goal(s): Knowledge of disease and therapeutic regimen to maintain health will improve  Short Term Goals: Ability to verbalize frustration and anger appropriately will improve, Ability to identify and develop effective coping behaviors will improve and Compliance with prescribed medications will improve  Medication  Management: RN will administer medications as ordered by provider, will assess and evaluate patient's response and provide education to patient for prescribed medication. RN will report any adverse and/or side effects to prescribing provider.  Therapeutic Interventions: 1 on 1 counseling sessions, Psychoeducation, Medication administration, Evaluate responses to treatment, Monitor vital signs and CBGs as ordered, Perform/monitor CIWA, COWS, AIMS and Fall Risk screenings as ordered, Perform wound care treatments as ordered.  Evaluation of Outcomes: Progressing   LCSW Treatment Plan for Primary Diagnosis: Severe recurrent major depression with psychotic features (HCC) Long Term Goal(s): Safe transition to appropriate next level of care at discharge, Engage patient in therapeutic group addressing interpersonal concerns.  Short Term Goals: Engage patient in aftercare planning with referrals and resources and Increase skills for wellness and recovery  Therapeutic Interventions: Assess for all discharge needs, 1 to 1 time with Social worker, Explore available resources and support systems, Assess for adequacy in community support network, Educate family and significant other(s) on suicide prevention, Complete Psychosocial Assessment, Interpersonal group therapy.  Evaluation of Outcomes: Progressing   Progress in Treatment: Attending groups: Yes. Participating in groups: Yes. Taking medication as prescribed: Yes. Toleration medication: Yes. Family/Significant other contact made: No, will contact:  CSW will contact pt's guardian Patient understands diagnosis: Yes. Discussing patient identified problems/goals with staff: Yes. Medical problems stabilized or resolved: Yes. Denies suicidal/homicidal  ideation: Yes. Issues/concerns per patient self-inventory: No. Other: n/a  New problem(s) identified: No, Describe:  No new problems identified  New Short Term/Long Term Goal(s):  Patient Goals:   "Get better and find another group home"  Discharge Plan or Barriers: Tentative discharge plans are for pt to return to her grouphome and resume outpatient MM services.  Reason for Continuation of Hospitalization: Anxiety Mania Medication stabilization  Estimated Length of Stay: 3-5 days  Recreational Therapy: Patient Stressors: N/A Patient Goal: Patient will identify 3 positive coping skills strategies to use post d/c within 5 recreation therapy group sessions  Attendees: Patient: Brittany Palmer 05/20/2018 4:06 PM  Physician: Kristine Linea, MD 05/20/2018 4:06 PM  Nursing: Hulan Amato, RN 05/20/2018 4:06 PM  RN Care Manager: 05/20/2018 4:06 PM  Social Worker: Huey Romans, LCSW 05/20/2018 4:06 PM  Recreational Therapist: Garret Reddish, LRT 05/20/2018 4:06 PM  Other: Johny Shears, LCSWA 05/20/2018 4:06 PM  Other: Damian Leavell, Chaplain 05/20/2018 4:06 PM  Other: 05/20/2018 4:06 PM    Scribe for Treatment Team: Alease Frame, LCSW 05/20/2018 4:06 PM

## 2018-05-20 NOTE — [facility] Group Notes (Signed)
LCSW Group Therapy Note  05/20/2018 1:00 pm  Type of Therapy/Topic:  Group Therapy:  Emotion Regulation  Participation Level:  Active   Description of Group:    The purpose of this group is to assist patients in learning to regulate negative emotions and experience positive emotions. Patients will be guided to discuss ways in which they have been vulnerable to their negative emotions. These vulnerabilities will be juxtaposed with experiences of positive emotions or situations, and patients will be challenged to use positive emotions to combat negative ones. Special emphasis will be placed on coping with negative emotions in conflict situations, and patients will process healthy conflict resolution skills.  Therapeutic Goals: 1. Patient will identify two positive emotions or experiences to reflect on in order to balance out negative emotions 2. Patient will label two or more emotions that they find the most difficult to experience 3. Patient will demonstrate positive conflict resolution skills through discussion and/or role plays  Summary of Patient Progress: Brittany Palmer actively participated in today's group discussion on emotion regulation.  Brittany Palmer shared that anger and eventual rage has been the most difficult emotions that she often experiences.  Brittany Palmer shared that she has tried doing some coping strategies, but has found that nothing seems to help her to calm down.  Brittany Palmer was encouraged to think about some of the things that others are using in the group and consider using one or two to see if it can help her with conflict in a more positive way.      Therapeutic Modalities:   Cognitive Behavioral Therapy Feelings Identification Dialectical Behavioral Therapy

## 2018-05-20 NOTE — Progress Notes (Signed)
   05/20/18 1045  Clinical Encounter Type  Visited With Patient  Visit Type Initial;Spiritual support;Behavioral Health  Referral From Social work  Consult/Referral To Chaplain  Spiritual Encounters  Spiritual Needs Emotional;Other (Comment)   CH attended the patient's treatment team meeting. Ms. Brittany Palmer had reported being dizzy and nauseated yesterday but was feel better to day likely because she slept last night. It was revealed by the Dr. Claiborne Billingshat the patient's mother did not know that Ms. Brittany Palmer was in the hospital. The patient also feels that the medication does not work at the current dose. Ms. Brittany Palmer also stated that she does not want to go back to her current group home. She got into an argument with another lady at the group home and she feel that a different home would be better. Patient stated that her goal is to get better.

## 2018-05-20 NOTE — Progress Notes (Signed)
Recreation Therapy Notes  Date: 05/20/2018  Time: 9:30 am  Location: Craft Room  Behavioral response: Appropriate    Intervention Topic: Team work  Discussion/Intervention:  Group content on today was focused on teamwork. The group identified what teamwork is. Individuals described who is a part of their team. Patients expressed why they thought teamwork is important. The group stated reasons why they thought it was easier to work with a Comptrollersmaller/larger team. Individuals discussed some positives and negatives of working with a team. Patients gave examples of past experiences they had while working with a team. The group participated in the intervention "Story in a bag", patients were in groups and were able to test their skill in a team setting.  Clinical Observations/Feedback:  Patient came to group and stated team work gets the job done faster. Individual was social with peers and staff while participating in the intervention.  Edmundo Tedesco LRT/CTRS         Karle Desrosier 05/20/2018 1:57 PM

## 2018-05-20 NOTE — Plan of Care (Addendum)
Patient found asleep in bed upon my arrival. Patient is isolative to her room throughout the evening. Patient is neither visible nor social this evening. Patient slept all evening and could not verbalize why other than to state that she is drowsy. Reports depression but denies SI/HI/AVH at this time. Reports feeling safe on the unit and contracts for safety. Reports poor appetite. Denies pain. Patient is minimally verbal but polite and pleasant. Compliant with HS medications and VS. VSS. Does not attend group. Q 15 minute checks maintained. Will continue to monitor throughout the shift. Patient slept 7.75 hours. No apparent distress. Reports sleeping well. Reports feeling better today than yesterday. CBG 73. Will endorse care to oncoming shift.  Problem: Education: Goal: Emotional status will improve Outcome: Progressing Goal: Mental status will improve Outcome: Progressing   Problem: Coping: Goal: Ability to demonstrate self-control will improve Outcome: Progressing   Problem: Physical Regulation: Goal: Ability to maintain clinical measurements within normal limits will improve Outcome: Progressing   Problem: Education: Goal: Knowledge of the prescribed therapeutic regimen will improve Outcome: Progressing   Problem: Coping: Goal: Will verbalize feelings Outcome: Progressing   Problem: Activity: Goal: Interest or engagement in activities will improve Outcome: Not Progressing Goal: Sleeping patterns will improve Outcome: Not Progressing   Problem: Activity: Goal: Imbalance in normal sleep/wake cycle will improve Outcome: Not Progressing

## 2018-05-21 LAB — URINE CULTURE

## 2018-05-21 MED ORDER — MOMETASONE FURO-FORMOTEROL FUM 100-5 MCG/ACT IN AERO
2.0000 | INHALATION_SPRAY | Freq: Two times a day (BID) | RESPIRATORY_TRACT | Status: DC
  Administered 2018-05-21 – 2018-05-25 (×8): 2 via RESPIRATORY_TRACT
  Filled 2018-05-21: qty 8.8

## 2018-05-21 MED ORDER — LEVALBUTEROL HCL 1.25 MG/0.5ML IN NEBU
1.2500 mg | INHALATION_SOLUTION | Freq: Four times a day (QID) | RESPIRATORY_TRACT | Status: DC | PRN
  Administered 2018-05-22 – 2018-05-25 (×6): 1.25 mg via RESPIRATORY_TRACT
  Filled 2018-05-21 (×7): qty 0.5

## 2018-05-21 MED ORDER — QUETIAPINE FUMARATE 200 MG PO TABS
300.0000 mg | ORAL_TABLET | Freq: Every day | ORAL | Status: DC
  Administered 2018-05-21 – 2018-05-24 (×4): 300 mg via ORAL
  Filled 2018-05-21 (×4): qty 1

## 2018-05-21 NOTE — [facility] Group Notes (Signed)
[facility] Group Notes:  (Nursing/MHT/Case Management/Adjunct)  Date:  05/21/2018  Time:  10:52 PM  Type of Therapy:  Group Therapy  Participation Level:  Active  Participation Quality:  Appropriate  Affect:  Appropriate  Cognitive:  Appropriate  Insight:  Appropriate  Engagement in Group:  Engaged  Modes of Intervention:  Discussion  Summary of Progress/Problems: Brittany Palmer attended group. Brittany Palmer stated her goal was to think positive. Brittany Palmer stated she was able to accomplish her goal. Brittany Palmer stated she was upset following group. Brittany Palmer stated she burped after eating snack and another patient got mad. Brittany Palmer stated, "I'm pissed off right now." Brittany Palmer was able to calm down without incident. MHT reviewed the rules and expectations of the unit. 1. Visitation hours (2 visitors at a time, no one under 12 allowed on unit) 2. Phone hours (one long distance call per day) 3. Routine checks throughout night, encouraged to cover appropriately 4. Clean up after self, no food or drinks allowed in room 5. No touching, hugging, doing hair 6. No walking down other hallways, only room hall and main hall, or going into other's patient rooms 7. Vital times and self-inventory sheets to be completed 8. No use of last names or sharing of personal information MHT processed with patients about attending groups and availability of social workers and doctors. MHT encouraged patients to learn new coping skills while attending groups MHT encouraged communication between prescribing doctors and patients MHT encouraged patients to inform prescribing doctors of the effectiveness of the their medications  Brittany Palmer 05/21/2018, 10:52 PM

## 2018-05-21 NOTE — Plan of Care (Signed)
Pt. Affect is brighter this evening, despite complaints of some discomfort breathing and nagging cough that is being addressed and self report of anxiety and depression at, "8/10" and "5/10" respectively. Pt. Participation around the unit, at groups, and snacks good. Pt. Complaint with medications. Pt. Able to engage appropriately during assessments. Pt. Able to contract for safety. Pt. Denies Si/HI. Pt. Reports she can remain safe on the unit.    Problem: Education: Goal: Emotional status will improve Outcome: Progressing Goal: Mental status will improve Outcome: Progressing   Problem: Activity: Goal: Interest or engagement in activities will improve Outcome: Progressing   Problem: Health Behavior/Discharge Planning: Goal: Compliance with treatment plan for underlying cause of condition will improve Outcome: Progressing   Problem: Safety: Goal: Periods of time without injury will increase Outcome: Progressing   Problem: Activity: Goal: Interest or engagement in leisure activities will improve Outcome: Progressing   Problem: Coping: Goal: Will verbalize feelings Outcome: Progressing   Problem: Health Behavior/Discharge Planning: Goal: Compliance with therapeutic regimen will improve Outcome: Progressing   Problem: Safety: Goal: Ability to disclose and discuss suicidal ideas will improve Outcome: Progressing   Problem: Safety: Goal: Ability to remain free from injury will improve Outcome: Progressing

## 2018-05-21 NOTE — Progress Notes (Addendum)
Recreation Therapy Notes  Date: 05/21/2018  Time: 9:30 am  Location: Craft Room  Behavioral response: Appropriate  Intervention Topic: Self-esteem  Discussion/Intervention:  Group content today was focused on self-esteem. Patient defined self-esteem and where it comes form. The group described reasons self-esteem is important. Individuals stated things that impact self-esteem and positive ways to improve self-esteem. The group participated in the intervention "Improving Me" where patients were able to create a collage of positive things that makes them who they are. Clinical Observations/Feedback:  Patient came to group and defined self-esteem as what you think about yourself. Individual was social with peers and staff while participating in the intervention. [name] LRT/CTRS         Brittany Palmer 05/21/2018 11:36 AM

## 2018-05-21 NOTE — Progress Notes (Signed)
D: Pt denies SI/HI, contracts for safety. Pt. Does report  command auditory hallucinations is something she struggles with, though Reports not actively hearing voices this evening. Pt is pleasant and cooperative and engages well during assessments. Pt. reports some discomfort when breathing this evening and a nagging cough that was further assessed this evening with call placed to MD to update. Pt. Received chest x-ray, PRN medications, and was encouraged to increase fluids. Pt. Reports improvement in breathing from PRN medications and staff interventions/encouragements to increase fluids. Physician impression of CXR reportedly, WDL, but will notify treatment team during shift report CXR was complete. Pt. Affect is brighter this evening, despite complaints of some discomfort. Pt. self report of anxiety and depression at, "8/10" and "5/10" respectively. Pt. Continues to perseverate she really does not want to return to previous group home living.   A: Q x 15 minute observation checks were completed for safety. Patient was provided with education, but needs reinforcement.  Patient was given/offered medications per orders. Patient  was encourage to attend groups, participate in unit activities and continue with plan of care. Pt. Chart and plans of care reviewed. Pt. Given support and encouragement.   R: Patient is complaint with medication and unit procedures.             Precautionary checks every 15 minutes for safety maintained, room free of safety hazards, patient sustains no injury or falls during this shift. Will endorse care to next shift.

## 2018-05-21 NOTE — Plan of Care (Addendum)
Patient is alert and oriented to self, place and situation. Patient has an unproductive cough and Robitussin was administered with semi-effectiveness. O2 Sat checked results: 100% on room air. Patient present in the milieu interacting with select peers. Denies having thoughts of self harm but states, "I would probably hurt someone if I had to go back to the group home, I hate it there." Patient went outdoors this morning for recreation, came back inside coughing. Assessed by this Clinical research associatewriter and MD made aware. Compliant with medications, MD made aware of coughing. Milieu remains safe with q 15 minute safety checks. Will continue to monitor.

## 2018-05-21 NOTE — Plan of Care (Signed)
  Problem: Education: Goal: Knowledge of [place] [facility] Education information/materials will improve Outcome: Progressing Goal: Emotional status will improve Outcome: Progressing Goal: Mental status will improve Outcome: Progressing Goal: Verbalization of understanding the information provided will improve Outcome: Progressing   Problem: Activity: Goal: Interest or engagement in activities will improve Outcome: Progressing Goal: Sleeping patterns will improve Outcome: Progressing   Problem: Coping: Goal: Ability to verbalize frustrations and anger appropriately will improve Outcome: Progressing Goal: Ability to demonstrate self-control will improve Outcome: Progressing   Problem: Health Behavior/Discharge Planning: Goal: Identification of resources available to assist in meeting health care needs will improve Outcome: Progressing Goal: Compliance with treatment plan for underlying cause of condition will improve Outcome: Progressing   Problem: Physical Regulation: Goal: Ability to maintain clinical measurements within normal limits will improve Outcome: Progressing   Problem: Safety: Goal: Periods of time without injury will increase Outcome: Progressing   Problem: Education: Goal: Utilization of techniques to improve thought processes will improve Outcome: Progressing Goal: Knowledge of the prescribed therapeutic regimen will improve Outcome: Progressing   Problem: Activity: Goal: Interest or engagement in leisure activities will improve Outcome: Progressing Goal: Imbalance in normal sleep/wake cycle will improve Outcome: Progressing   Problem: Coping: Goal: Coping ability will improve Outcome: Progressing Goal: Will verbalize feelings Outcome: Progressing   Problem: Health Behavior/Discharge Planning: Goal: Ability to make decisions will improve Outcome: Progressing Goal: Compliance with therapeutic regimen will improve Outcome: Progressing    Problem: Role Relationship: Goal: Will demonstrate positive changes in social behaviors and relationships Outcome: Progressing   Problem: Safety: Goal: Ability to disclose and discuss suicidal ideas will improve Outcome: Progressing Goal: Ability to identify and utilize support systems that promote safety will improve Outcome: Progressing   Problem: Self-Concept: Goal: Will verbalize positive feelings about self Outcome: Progressing Goal: Level of anxiety will decrease Outcome: Progressing   Problem: Education: Goal: Ability to incorporate positive changes in behavior to improve self-esteem will improve Outcome: Progressing   Problem: Health Behavior/Discharge Planning: Goal: Ability to identify and utilize available resources and services will improve Outcome: Progressing Goal: Ability to remain free from injury will improve Outcome: Progressing   Problem: Self-Concept: Goal: Will verbalize positive feelings about self Outcome: Progressing   Problem: Skin Integrity: Goal: Demonstration of wound healing without infection will improve Outcome: Progressing   Problem: Education: Goal: Ability to make informed decisions regarding treatment will improve Outcome: Progressing   Problem: Coping: Goal: Coping ability will improve Outcome: Progressing   Problem: Health Behavior/Discharge Planning: Goal: Identification of resources available to assist in meeting health care needs will improve Outcome: Progressing   Problem: Medication: Goal: Compliance with prescribed medication regimen will improve Outcome: Progressing   Problem: Self-Concept: Goal: Ability to disclose and discuss suicidal ideas will improve Outcome: Progressing Goal: Will verbalize positive feelings about self Outcome: Progressing   Problem: Safety: Goal: Ability to remain free from injury will improve Outcome: Progressing

## 2018-05-21 NOTE — [facility] Group Notes (Signed)
LCSW Group Therapy Note  05/21/2018 1:00 pm  Type of Therapy/Topic:  Group Therapy:  Balance in Life  Participation Level:  Active  Description of Group:    This group will address the concept of balance and how it feels and looks when one is unbalanced. Patients will be encouraged to process areas in their lives that are out of balance and identify reasons for remaining unbalanced. Facilitators will guide patients in utilizing problem-solving interventions to address and correct the stressor making their life unbalanced. Understanding and applying boundaries will be explored and addressed for obtaining and maintaining a balanced life. Patients will be encouraged to explore ways to assertively make their unbalanced needs known to significant others in their lives, using other group members and facilitator for support and feedback.  Therapeutic Goals: 1. Patient will identify two or more emotions or situations they have that consume much of in their lives. 2. Patient will identify signs/triggers that life has become out of balance:  3. Patient will identify two ways to set boundaries in order to achieve balance in their lives:  4. Patient will demonstrate ability to communicate their needs through discussion and/or role plays  Summary of Patient Progress:   Brittany Palmer was able to participate some in today's group discussion on balance in life.  Brittany Palmer shared that anger has been the one emotion that has consumed much of her life.  Brittany Palmer shared that her roommate has been a big trigger for her anger recently and that she often punches walls, uses profanity, and tries to hurt herself which is a sign that her life has become out of balance.   Therapeutic Modalities:   Cognitive Behavioral Therapy Solution-Focused Therapy Assertiveness Training  Alease FrameSonya S Malisa Ruggiero, [place]CS[place] 05/21/2018 3:49 PM

## 2018-05-21 NOTE — Progress Notes (Addendum)
[facility] MD Progress Note  05/22/2018 11:04 AM Brittany Palmer  MRN:  [id number]  Subjective:    Brittany Palmer is a 22 year old incompetent female with a history of schizoaffective disorder and autism spectrum disorder admitted for suicidal and homicidal threats and auditory hallucinations. She was hoping for a new group home but it will not bee possible.   Brittany Palmer reports new onset hallucinations last night. So far she would always call the "thoughts" but now she hears female and female unfamiliar voices telling her to hurt herself. She also believes that if discharge back to her group home she is going to hurt a peer there. She reports hurting other people in the past. She sleep well and denies depression or anxiety. No side effects from medications.  She has asthma exacerbation, has been coughing unproductive cough and receives nebulizer for it. Chest Xray is negative. At supper she felt nauseated and vomited once. No nausea today. Ate breakfast with no problems. Last night, she had disagreement with a peer here after someone made a comment about her burping.   Principal Problem: Severe recurrent major depression with psychotic features [facility]) Diagnosis:   Patient Active Problem List   Diagnosis Date Noted  . Severe recurrent major depression with psychotic features (HCC) [F33.3] 05/19/2018    Priority: High  . HTN (hypertension) [I10] 05/19/2018  . GERD (gastroesophageal reflux disease) [K21.9] 05/19/2018  . Asthma [J45.909] 05/19/2018  . MDD (major depressive disorder), severe (HCC) [F32.2] 09/09/2017  . Autism spectrum disorder [F84.0] 05/17/2017  . MDD (major depressive disorder), recurrent severe, without psychosis (HCC) [F33.2] 05/16/2017   Total Time spent with patient: 20 minutes  Past Psychiatric History: schizoaffective disorder  Past Medical History:  Past Medical History:  Diagnosis Date  . Autism   . GERD (gastroesophageal reflux disease)   . Hypertension     Past Surgical  History:  Procedure Laterality Date  . TONSILLECTOMY     Family History: History reviewed. No pertinent family history. Family Psychiatric  History: sister committed suicide Social History:  Social History   Substance and Sexual Activity  Alcohol Use No     Social History   Substance and Sexual Activity  Drug Use No    Social History   Socioeconomic History  . Marital status: Single    Spouse name: Not on file  . Number of children: Not on file  . Years of education: Not on file  . Highest education level: Not on file  Occupational History  . Not on file  Social Needs  . Financial resource strain: Not on file  . Food insecurity:    Worry: Not on file    Inability: Not on file  . Transportation needs:    Medical: Not on file    Non-medical: Not on file  Tobacco Use  . Smoking status: Never Smoker  . Smokeless tobacco: Never Used  Substance and Sexual Activity  . Alcohol use: No  . Drug use: No  . Sexual activity: Never  Lifestyle  . Physical activity:    Days per week: Not on file    Minutes per session: Not on file  . Stress: Not on file  Relationships  . Social connections:    Talks on phone: Not on file    Gets together: Not on file    Attends religious service: Not on file    Active member of club or organization: Not on file    Attends meetings of clubs or organizations: Not on file  Relationship status: Not on file  Other Topics Concern  . Not on file  Social History Narrative  . Not on file   Additional Social History:                         Sleep: Fair  Appetite:  Fair  Current Medications: Current Facility-Administered Medications  Medication Dose Route Frequency Provider Last Rate Last Dose  . acetaminophen (TYLENOL) tablet 650 mg  650 mg Oral Q6H PRN Clapacs, John T, MD      . albuterol (PROVENTIL HFA;VENTOLIN HFA) 108 (90 Base) MCG/ACT inhaler 2 puff  2 puff Inhalation Q6H PRN Clapacs, Jackquline Denmark, MD   2 puff at 05/21/18 0816   . alum & mag hydroxide-simeth (MAALOX/MYLANTA) 200-200-20 MG/5ML suspension 30 mL  30 mL Oral Q4H PRN Clapacs, John T, MD      . guaiFENesin (ROBITUSSIN) 100 MG/5ML solution 100 mg  5 mL Oral Q6H PRN Clapacs, Jackquline Denmark, MD   100 mg at 05/21/18 0816  . hydrOXYzine (ATARAX/VISTARIL) tablet 50 mg  50 mg Oral TID PRN Clapacs, Jackquline Denmark, MD   50 mg at 05/19/18 2130  . levalbuterol (XOPENEX) nebulizer solution 1.25 mg  1.25 mg Nebulization Q6H PRN Gaelen Brager B, MD      . magnesium hydroxide (MILK OF MAGNESIA) suspension 30 mL  30 mL Oral Daily PRN Clapacs, John T, MD      . mometasone-formoterol (DULERA) 100-5 MCG/ACT inhaler 2 puff  2 puff Inhalation BID Emmajo Bennette B, MD   2 puff at 05/21/18 2114  . pantoprazole (PROTONIX) EC tablet 40 mg  40 mg Oral Daily Clapacs, Jackquline Denmark, MD   40 mg at 05/22/18 0829  . QUEtiapine (SEROQUEL) tablet 300 mg  300 mg Oral QHS Atina Feeley B, MD   300 mg at 05/21/18 2114  . verapamil (CALAN-SR) CR tablet 240 mg  240 mg Oral QHS Clapacs, Jackquline Denmark, MD   240 mg at 05/21/18 2121    Lab Results:  Results for orders placed or performed during the hospital encounter of 05/19/18 (from the past 48 hour(s))  Glucose, capillary     Status: None   Collection Time: 05/20/18  5:31 PM  Result Value Ref Range   Glucose-Capillary 88 70 - 99 mg/dL   Comment 1 Document in Chart     Blood Alcohol level:  Lab Results  Component Value Date   ETH <10 05/13/2018    Metabolic Disorder Labs: Lab Results  Component Value Date   HGBA1C 5.2 05/19/2018   MPG 103 05/19/2018   MPG 96.8 09/11/2017   Lab Results  Component Value Date   PROLACTIN 30.2 04/12/2011   Lab Results  Component Value Date   CHOL 106 05/19/2018   TRIG 64 05/19/2018   HDL 37 (L) 05/19/2018   CHOLHDL 2.9 05/19/2018   VLDL 13 05/19/2018   LDLCALC 56 05/19/2018   LDLCALC 53 09/11/2017    Physical Findings: AIMS: Facial and Oral Movements Muscles of Facial Expression: None, normal Lips  and Perioral Area: None, normal Jaw: None, normal Tongue: None, normal,Extremity Movements Upper (arms, wrists, hands, fingers): None, normal Lower (legs, knees, ankles, toes): None, normal, Trunk Movements Neck, shoulders, hips: None, normal, Overall Severity Severity of abnormal movements (highest score from questions above): None, normal Incapacitation due to abnormal movements: None, normal Patient's awareness of abnormal movements (rate only patient's report): No Awareness, Dental Status Current problems with teeth and/or dentures?: No Does patient  usually wear dentures?: No  CIWA:    COWS:     Musculoskeletal: Strength & Muscle Tone: within normal limits Gait & Station: normal Patient leans: N/A  Psychiatric Specialty Exam: Physical Exam  Nursing note and vitals reviewed. Psychiatric: Her speech is normal. Her affect is blunt. She is actively hallucinating. Thought content is paranoid. Cognition and memory are normal. She expresses impulsivity.    Review of Systems  Neurological: Negative.   Psychiatric/Behavioral: Positive for hallucinations.  All other systems reviewed and are negative.   Blood pressure 117/80, pulse 79, temperature 98.4 F (36.9 C), temperature source Oral, resp. rate 16, height 5\' 5"  (1.651 m), weight 72.6 kg, SpO2 100 %.Body mass index is 26.63 kg/m.  General Appearance: Casual  Eye Contact:  Good  Speech:  Clear and Coherent  Volume:  Normal  Mood:  Depressed  Affect:  Blunt  Thought Process:  Goal Directed and Descriptions of Associations: Intact  Orientation:  Full (Time, Place, and Person)  Thought Content:  Hallucinations: Auditory and Paranoid Ideation  Suicidal Thoughts:  No  Homicidal Thoughts:  No  Memory:  Immediate;   Fair Recent;   Fair Remote;   Fair  Judgement:  Poor  Insight:  Lacking  Psychomotor Activity:  Normal  Concentration:  Concentration: Fair and Attention Span: Fair  Recall:  FiservFair  Fund of Knowledge:  Fair   Language:  Fair  Akathisia:  No  Handed:  Right  AIMS (if indicated):     Assets:  Communication Skills Desire for Improvement Financial Resources/Insurance Housing Physical Health Resilience Social Support  ADL's:  Intact  Cognition:  WNL  Sleep:  Number of Hours: 7.75     Treatment Plan Summary: Daily contact with patient to assess and evaluate symptoms and progress in treatment and Medication management   Ms. [name] is a 22 year old female with a history of depression and mood instability adfmitted for agitation and threatening behavior at the group home and homicidal and suicidal ideation with a plan to cut herself.  #Suicidal ideation resolved, atill homicidal -patient able to contract for safety in the hospital  #Mood and psychosis, impoving -continueAbilify manitena400 mg monthlyinjections,received her shot this month already, date unknown -continue Seroquel 300 mg nightly to address hallucinations  #Insomnia,resolved with Seroquel  #Asthma, new onset cough, afebrile -continue Albuterol inhaler -continue Xopenex nebulizer PRN -continue Dulera BID -chest Xray is negative  #GERD -continue Protonix 40 mg daily  #HTN -Verapamil 240 mg daily  #H/O hypoglycemia -CBG normal  #Labs -lipid panel, TSH, A1Care normal -EKG reviewed, NSR with QTc 437 -pregnancy testis negative  #Social -incompetent adult -mother is the guardian  #Dispsition -discharge back to her group home -follow upwith her regular provider  Kristine LineaJolanta Kahli Fitzgerald, MD 05/22/2018, 11:04 AM

## 2018-05-22 MED ORDER — IPRATROPIUM BROMIDE 0.02 % IN SOLN
0.5000 mg | Freq: Four times a day (QID) | RESPIRATORY_TRACT | Status: DC | PRN
  Administered 2018-05-22 – 2018-05-24 (×4): 0.5 mg via RESPIRATORY_TRACT
  Filled 2018-05-22 (×5): qty 2.5

## 2018-05-22 NOTE — Progress Notes (Signed)
Received Any this AM after breakfast,she was compliant with her medication. She denied all of the psychiatric sytmptoms this AM. She stated hearing voices last night telling her to hurt herself. The voices are gone this AM, she will let this writer know when they return. She continues to have a cough at intervals. Respiratory administered a neb with good results. She recommended Atrovent or Xopenex for her rescue inhaler.She rated depression 2/10 and anxiety 3/10 on her self inventory form. No change in her status this PM.

## 2018-05-22 NOTE — Progress Notes (Signed)
Recreation Therapy Notes   Date: 05/22/2018  Time: 9:30 am  Location: Craft Room  Behavioral response: Appropriate   Intervention Topic: Coping skills  Discussion/Intervention:  Group content on today was focused on coping skills. The group defined what coping skills are and when they can be used. Individuals described how they normally cope with thing and the coping skills they normally use. Patients expressed why it is important to cope with things and how not coping with things can affect you. The group participated in the intervention "My coping box" and made coping boxes while adding coping skills they could use in the future to the box. Clinical Observations/Feedback:  Patient attended group and defined coping skills as a distraction. She identified music as a Associate Professorcoping skill she uses. Individual participated in the intervention and was upbeat.  Braedyn Riggle LRT/CTRS         Tiffay Pinette 05/22/2018 12:42 PM

## 2018-05-22 NOTE — Progress Notes (Signed)
Nursing note 7p-7a  Pt observed interacting with peers on unit this shift. Displayed a bright, animated affect and pleasant mood upon interaction with this Clinical research associatewriter. Pt denies pain ,denies SI and also denies visual hallucinations at this time.Pt does endorse audio hallucinations that are command in type. Pt stated that "the voices tell me to say bad stuff." She denies HI but then stated, "the only way I can get rid of the bad toward someone at the group home is if I go to another group home."  Pt is able to verbally contract for safety with this RN. Goal: "to go to another group home"  Pt is now resting in bed with eyes closed, with no signs or symptoms of pain or distress noted. Pt continues to remain safe on the unit and is observed by rounding every 15 min. RN will continue to monitor.

## 2018-05-22 NOTE — [facility] Group Notes (Signed)
05/22/2018 1PM  Type of Therapy and Topic:  Group Therapy:  Feelings around Relapse and Recovery  Participation Level:  Active   Description of Group:    Patients in this group will discuss emotions they experience before and after a relapse. They will process how experiencing these feelings, or avoidance of experiencing them, relates to having a relapse. Facilitator will guide patients to explore emotions they have related to recovery. Patients will be encouraged to process which emotions are more powerful. They will be guided to discuss the emotional reaction significant others in their lives may have to patients' relapse or recovery. Patients will be assisted in exploring ways to respond to the emotions of others without this contributing to a relapse.  Therapeutic Goals: 1. Patient will identify two or more emotions that lead to a relapse for them 2. Patient will identify two emotions that result when they relapse 3. Patient will identify two emotions related to recovery 4. Patient will demonstrate ability to communicate their needs through discussion and/or role plays   Summary of Patient Progress: Actively and appropriately engaged in the group. Patient was able to provide support and validation to other group members.Patient practiced active listening when interacting with the facilitator and other group members. [name] reports relapsing after being bullied at her group home. She reports "I don't want to go back there and want to be placed in a new group home." She reports struggling with anger and reports enjoying music when she does feel angry.  Patient is still in the process of obtaining treatment goals.      Therapeutic Modalities:   Cognitive Behavioral Therapy Solution-Focused Therapy Assertiveness Training Relapse Prevention Therapy   Johny ShearsCassandra  Mandala, LCSW 05/22/2018 3:31 PM

## 2018-05-22 NOTE — [facility] Group Notes (Signed)
[facility] Group Notes:  (Nursing/MHT/Case Management/Adjunct)  Date:  05/22/2018  Time:  9:41 PM  Type of Therapy:  Group Therapy  Participation Level:  Active  Participation Quality:  Appropriate  Affect:  Appropriate  Cognitive:  Alert  Insight:  Good  Engagement in Group:  Supportive  Modes of Intervention:  Activity  Summary of Progress/Problems:  Brittany Palmer 05/22/2018, 9:41 PM

## 2018-05-22 NOTE — Progress Notes (Signed)
Patient ID: Brittany Palmer, female   DOB: 05/11/1996, 22 y.o.   MRN: [id number] CSW attempted to contact pt's mother/guardian, [name] [phone]) but was unable to reach her or leave a VM.  CSW also attempted to contact [name] at [phone], but was unable to reach her.  CSW left [name] a VM.

## 2018-05-23 NOTE — Progress Notes (Signed)
Patient alert and oriented x 4, denies SI/HI/AVH, affect is bright upon approach, patient;s voice was noted to be hoarse and she explained that she needed a neb treatment. Writer called respiratory staff, and she received her nebulizer treatment with good effects. Patient's thoughts are organized and coherent.  Support and encouragement offered to patient and she was encouraged to go to group which she did . No distress noted, 15 minutes safety checks maintained will contijnue to monitor.

## 2018-05-23 NOTE — [facility] Group Notes (Signed)
LCSW Group Therapy Note  05/23/2018 1:15pm  Type of Therapy and Topic: Group Therapy: Holding on to Grudges   Participation Level: Active   Description of Group:  In this group patients will be asked to explore and define a grudge. Patients will be guided to discuss their thoughts, feelings, and reasons as to why people have grudges. Patients will process the impact grudges have on daily life and identify thoughts and feelings related to holding grudges. Facilitator will challenge patients to identify ways to let go of grudges and the benefits this provides. Patients will be confronted to address why one struggles letting go of grudges. Lastly, patients will identify feelings and thoughts related to what life would look like without grudges. This group will be process-oriented, with patients participating in exploration of their own experiences, giving and receiving support, and processing challenge from other group members.  Therapeutic Goals:  1. Patient will identify specific grudges related to their personal life.  2. Patient will identify feelings, thoughts, and beliefs around grudges.  3. Patient will identify how one releases grudges appropriately.  4. Patient will identify situations where they could have let go of the grudge, but instead chose to hold on.   Summary of Patient Progress: The patient scored his/her mood at a 9 (10 best). Patients were guided to discuss their thoughts, feelings, and reasons as to why people have grudges. The patient was able to process the impact grudges have on daily life and identified thoughts and feelings related to holding grudges. The patient was challenged to identify ways to let go of grudges and the benefits this provides. Pt. actively and appropriately engaged in the group. Patient was able to provide support and validation to other group members.   Therapeutic Modalities:  Cognitive Behavioral Therapy  Solution Focused Therapy  Motivational  Interviewing  Brief Therapy   Ivannia Willhelm  CUEBAS-COLON, LCSW 05/23/2018 12:42 PM

## 2018-05-23 NOTE — Plan of Care (Signed)
Pt. Presents this evening pleasant and cooperative, affect brighter. Pt. Participation in unit activities and groups appropriate. Pt. Complaint with medications. Pt. Denies SI/HI, contracts for safety.    Problem: Education: Goal: Emotional status will improve Outcome: Progressing Goal: Mental status will improve Outcome: Progressing   Problem: Activity: Goal: Interest or engagement in activities will improve Outcome: Progressing   Problem: Health Behavior/Discharge Planning: Goal: Compliance with treatment plan for underlying cause of condition will improve Outcome: Progressing   Problem: Safety: Goal: Periods of time without injury will increase Outcome: Progressing   Problem: Safety: Goal: Ability to disclose and discuss suicidal ideas will improve Outcome: Progressing   Problem: Health Behavior/Discharge Planning: Goal: Ability to remain free from injury will improve Outcome: Progressing

## 2018-05-23 NOTE — Progress Notes (Signed)
D: Pt denies SI/HI, contracts for safety. Pt is pleasant and cooperative, affect is brighter overall. Pt. Periodically smiles and laughs cheerfully, engaging appropriately with peers, staff, and this Clinical research associatewriter. Pt. Complains of anxiety later in the night, given PRN medications with good effect.  Patient Interactions appropriate overall. Pt. Continues to endorse having command auditory hallucinations that, "sometimes say to get violent". Pt. Given education to come to staff with safety concerns if she feels the urge to listen to voices. During assessments depression reportedly, "1/10", but does continue to fixate on not going back to group home she came from, this makes her clearly anxious and concerned. Pt. Denies any breathing problems this evening, reports staff interventions earlier in the day reportedly affective.  A: Q x 15 minute observation checks were completed for safety. Patient was provided with education, but needs reinforcement.  Patient was given/offered medications per orders. Patient  was encourage to attend groups, participate in unit activities and continue with plan of care. Pt. Chart and plans of care reviewed. Pt. Given support and encouragement.   R: Patient is complaint with medication and unit procedures.             Precautionary checks every 15 minutes for safety maintained, room free of safety hazards, patient sustains no injury or falls during this shift. Will endorse care to next shift.

## 2018-05-23 NOTE — Progress Notes (Signed)
D- Patient alert and oriented. Patient presents in a pleasant mood on assessment stating that she slept good last night and had no complaints/concerns to voice to this Clinical research associatewriter. Patient rated her anxiety a "4/10", however, she couldn't explain to this writer why she's feeling this way "I don't know". Patient also reports having HI towards a person at her group home "because she's bullying me". Patient denies SI, AVH, and pain at this time. Patient's goal for today is to "stay positive".  A- Scheduled medications administered to patient, per MD orders. Support and encouragement provided.  Routine safety checks conducted every 15 minutes.  Patient informed to notify staff with problems or concerns.  R- No adverse drug reactions noted. Patient contracts for safety at this time. Patient compliant with medications and treatment plan. Patient receptive, calm, and cooperative. Patient interacts well with others on the unit.  Patient remains safe at this time.

## 2018-05-23 NOTE — Progress Notes (Signed)
[facility] MD Progress Note  05/23/2018 4:48 PM Brittany Palmer  MRN:  [id number]  Subjective:   Brittany Palmer is a 22 year old incompetent female with a history of schizoaffective disorder and autism spectrum disorder admitted for suicidal and homicidal threats and auditory hallucinations. She was hoping for a new group home but it will not bee possible.   Today, she reports feeling well, "just chilling". She continues to say the voices "come and go", "sometimes, telling me to do bad thing" but did not elaborate. She wants more med to help with the voices.  Agreed to provide PRN haldol, and she can ask for it if she feels the voices are too disturbing to her. She agreed.   Denied SI or HI.   Per Dr. Demetrius Charity: She has asthma exacerbation, has been coughing unproductive cough and receives nebulizer for it. Chest Xray is negative. Pt has had no cough during our interview.  NO compliant of nausea, and was able to eat her meals without difficulty.   Principal Problem: Severe recurrent major depression with psychotic features [facility]) Diagnosis:   Patient Active Problem List   Diagnosis Date Noted  . Severe recurrent major depression with psychotic features (HCC) [F33.3] 05/19/2018  . HTN (hypertension) [I10] 05/19/2018  . GERD (gastroesophageal reflux disease) [K21.9] 05/19/2018  . Asthma [J45.909] 05/19/2018  . MDD (major depressive disorder), severe (HCC) [F32.2] 09/09/2017  . Autism spectrum disorder [F84.0] 05/17/2017  . MDD (major depressive disorder), recurrent severe, without psychosis (HCC) [F33.2] 05/16/2017   Total Time spent with patient: 20 minutes  Past Psychiatric History: schizoaffective disorder  Past Medical History:  Past Medical History:  Diagnosis Date  . Autism   . GERD (gastroesophageal reflux disease)   . Hypertension     Past Surgical History:  Procedure Laterality Date  . TONSILLECTOMY     Family History: History reviewed. No pertinent family history. Family Psychiatric   History: sister committed suicide Social History:  Social History   Substance and Sexual Activity  Alcohol Use No     Social History   Substance and Sexual Activity  Drug Use No    Social History   Socioeconomic History  . Marital status: Single    Spouse name: Not on file  . Number of children: Not on file  . Years of education: Not on file  . Highest education level: Not on file  Occupational History  . Not on file  Social Needs  . Financial resource strain: Not on file  . Food insecurity:    Worry: Not on file    Inability: Not on file  . Transportation needs:    Medical: Not on file    Non-medical: Not on file  Tobacco Use  . Smoking status: Never Smoker  . Smokeless tobacco: Never Used  Substance and Sexual Activity  . Alcohol use: No  . Drug use: No  . Sexual activity: Never  Lifestyle  . Physical activity:    Days per week: Not on file    Minutes per session: Not on file  . Stress: Not on file  Relationships  . Social connections:    Talks on phone: Not on file    Gets together: Not on file    Attends religious service: Not on file    Active member of club or organization: Not on file    Attends meetings of clubs or organizations: Not on file    Relationship status: Not on file  Other Topics Concern  . Not on file  Social History Narrative  . Not on file   Additional Social History:   Sleep: Fair  Appetite:  Fair  Current Medications: Current Facility-Administered Medications  Medication Dose Route Frequency Provider Last Rate Last Dose  . acetaminophen (TYLENOL) tablet 650 mg  650 mg Oral Q6H PRN Clapacs, John T, MD      . alum & mag hydroxide-simeth (MAALOX/MYLANTA) 200-200-20 MG/5ML suspension 30 mL  30 mL Oral Q4H PRN Clapacs, John T, MD      . guaiFENesin (ROBITUSSIN) 100 MG/5ML solution 100 mg  5 mL Oral Q6H PRN Clapacs, Jackquline DenmarkJohn T, MD   100 mg at 05/21/18 0816  . hydrOXYzine (ATARAX/VISTARIL) tablet 50 mg  50 mg Oral TID PRN Clapacs, Jackquline DenmarkJohn  T, MD   50 mg at 05/19/18 2130  . ipratropium (ATROVENT) nebulizer solution 0.5 mg  0.5 mg Nebulization Q6H PRN Pucilowska, Jolanta B, MD   0.5 mg at 05/23/18 1238  . levalbuterol (XOPENEX) nebulizer solution 1.25 mg  1.25 mg Nebulization Q6H PRN Pucilowska, Jolanta B, MD   1.25 mg at 05/23/18 1238  . magnesium hydroxide (MILK OF MAGNESIA) suspension 30 mL  30 mL Oral Daily PRN Clapacs, John T, MD      . mometasone-formoterol (DULERA) 100-5 MCG/ACT inhaler 2 puff  2 puff Inhalation BID Pucilowska, Jolanta B, MD   2 puff at 05/23/18 0839  . pantoprazole (PROTONIX) EC tablet 40 mg  40 mg Oral Daily Clapacs, Jackquline DenmarkJohn T, MD   40 mg at 05/23/18 0839  . QUEtiapine (SEROQUEL) tablet 300 mg  300 mg Oral QHS Pucilowska, Jolanta B, MD   300 mg at 05/22/18 2239  . verapamil (CALAN-SR) CR tablet 240 mg  240 mg Oral QHS Clapacs, Jackquline DenmarkJohn T, MD   240 mg at 05/22/18 2237    Lab Results:  No results found for this or any previous visit (from the past 48 hour(s)).  Blood Alcohol level:  Lab Results  Component Value Date   ETH <10 05/13/2018    Metabolic Disorder Labs: Lab Results  Component Value Date   HGBA1C 5.2 05/19/2018   MPG 103 05/19/2018   MPG 96.8 09/11/2017   Lab Results  Component Value Date   PROLACTIN 30.2 04/12/2011   Lab Results  Component Value Date   CHOL 106 05/19/2018   TRIG 64 05/19/2018   HDL 37 (L) 05/19/2018   CHOLHDL 2.9 05/19/2018   VLDL 13 05/19/2018   LDLCALC 56 05/19/2018   LDLCALC 53 09/11/2017    Physical Findings: AIMS: Facial and Oral Movements Muscles of Facial Expression: None, normal Lips and Perioral Area: None, normal Jaw: None, normal Tongue: None, normal,Extremity Movements Upper (arms, wrists, hands, fingers): None, normal Lower (legs, knees, ankles, toes): None, normal, Trunk Movements Neck, shoulders, hips: None, normal, Overall Severity Severity of abnormal movements (highest score from questions above): None, normal Incapacitation due to abnormal  movements: None, normal Patient's awareness of abnormal movements (rate only patient's report): No Awareness, Dental Status Current problems with teeth and/or dentures?: No Does patient usually wear dentures?: No  CIWA:    COWS:     Musculoskeletal: Strength & Muscle Tone: within normal limits Gait & Station: normal Patient leans: N/A  Psychiatric Specialty Exam: Physical Exam  Nursing note and vitals reviewed. Psychiatric: Her speech is normal. Her affect is blunt. She is actively hallucinating. Thought content is paranoid. Cognition and memory are normal. She expresses impulsivity.    Review of Systems  Neurological: Negative.   Psychiatric/Behavioral: Positive for hallucinations.  All other systems reviewed and are negative.   Blood pressure 110/72, pulse 100, temperature 97.7 F (36.5 C), temperature source Oral, resp. rate 16, height 5\' 5"  (1.651 m), weight 72.6 kg, SpO2 99 %.Body mass index is 26.63 kg/m.  General Appearance: Casual  Eye Contact:  Good  Speech:  Clear and Coherent  Volume:  Increased  Mood:  Euthymic  Affect:  Appropriate and Congruent  Thought Process:  Goal Directed and Descriptions of Associations: Intact  Orientation:  Full (Time, Place, and Person)  Thought Content:  Hallucinations: Auditory and Paranoid Ideation  Suicidal Thoughts:  No  Homicidal Thoughts:  No  Memory:  Immediate;   Fair Recent;   Fair Remote;   Fair  Judgement:  Poor  Insight:  Lacking  Psychomotor Activity:  Normal  Concentration:  Concentration: Fair and Attention Span: Fair  Recall:  [employer] of Knowledge:  Fair  Language:  Fair  Akathisia:  No  Handed:  Right  AIMS (if indicated):     Assets:  Communication Skills Desire for Improvement Financial Resources/Insurance Housing Physical Health Resilience Social Support  ADL's:  Intact  Cognition:  WNL  Sleep:  Number of Hours: 6.15     Treatment Plan Summary: Daily contact with patient to assess and  evaluate symptoms and progress in treatment and Medication management   Ms. Briele is a 22 year old female with a history of depression and mood instability adfmitted for agitation and threatening behavior at the group home and homicidal and suicidal ideation with a plan to cut herself.  #Suicidal ideation resolved, atill homicidal -patient able to contract for safety in the hospital -denied SI or HI in hosptial  #Mood and psychosis, impoving -continueAbilify manitena400 mg monthlyinjections,received her shot this month already, date unknown -continue Seroquel 300 mg nightly to address hallucinations -will provide haldol 2mg  po q6h prn for worsening AH. Pt did not want seroquel prn because the worry of sedation.   #Insomnia -- continue Seroquel 300mg  qhs.   #Asthma, new onset cough, afebrile -continue Albuterol inhaler -continue Xopenex nebulizer PRN -continue Dulera BID -chest Xray is negative  #GERD -continue Protonix 40 mg daily  #HTN -Verapamil 240 mg daily  #H/O hypoglycemia -CBG normal  #Labs -lipid panel, TSH, A1Care normal -EKG reviewed, NSR with QTc 437 -pregnancy testis negative  #Social -incompetent adult -mother is the guardian  #Dispsition -discharge back to her group home -follow upwith her regular provider  Toy Samarin, MD 05/23/2018, 4:48 PM

## 2018-05-24 MED ORDER — HALOPERIDOL 1 MG PO TABS: 2.0000 mg | ORAL_TABLET | Freq: Four times a day (QID) | ORAL | Status: DC | PRN

## 2018-05-24 NOTE — Progress Notes (Signed)
[facility] MD Progress Note  05/24/2018 10:06 AM Brittany Palmer  MRN:  [id number]  Subjective:   Brittany Palmer is a 22 year old incompetent female with a history of schizoaffective disorder and autism spectrum disorder admitted for suicidal and homicidal threats and auditory hallucinations. She was hoping for a new group home but it will not bee possible.   Today, she reports doing well, but still hears voices intermittently. She said that the voices told her to "kill myself" last night, yet, she doesn't appear to be stressed about it.  She said that she took vistaril at that time.  Agreed to provide PRN haldol, and she can ask for it if she feels the voices are too disturbing to her. She agreed.   She also asked about her inhaler, and is informed that she has both Atrovent and Proventil as needed if she feels SOB.   Denied SI or HI.   Per Dr. Demetrius Charity: She has asthma exacerbation, has been coughing unproductive cough and receives nebulizer for it. Chest Xray is negative. Pt has had no cough during our interview.  NO compliant of nausea, and was able to eat her meals without difficulty.   Principal Problem: Severe recurrent major depression with psychotic features [facility]) Diagnosis:   Patient Active Problem List   Diagnosis Date Noted  . Severe recurrent major depression with psychotic features (HCC) [F33.3] 05/19/2018  . HTN (hypertension) [I10] 05/19/2018  . GERD (gastroesophageal reflux disease) [K21.9] 05/19/2018  . Asthma [J45.909] 05/19/2018  . MDD (major depressive disorder), severe (HCC) [F32.2] 09/09/2017  . Autism spectrum disorder [F84.0] 05/17/2017  . MDD (major depressive disorder), recurrent severe, without psychosis (HCC) [F33.2] 05/16/2017   Total Time spent with patient: 20 minutes  Past Psychiatric History: schizoaffective disorder  Past Medical History:  Past Medical History:  Diagnosis Date  . Autism   . GERD (gastroesophageal reflux disease)   . Hypertension     Past Surgical  History:  Procedure Laterality Date  . TONSILLECTOMY     Family History: History reviewed. No pertinent family history. Family Psychiatric  History: sister committed suicide Social History:  Social History   Substance and Sexual Activity  Alcohol Use No     Social History   Substance and Sexual Activity  Drug Use No    Social History   Socioeconomic History  . Marital status: Single    Spouse name: Not on file  . Number of children: Not on file  . Years of education: Not on file  . Highest education level: Not on file  Occupational History  . Not on file  Social Needs  . Financial resource strain: Not on file  . Food insecurity:    Worry: Not on file    Inability: Not on file  . Transportation needs:    Medical: Not on file    Non-medical: Not on file  Tobacco Use  . Smoking status: Never Smoker  . Smokeless tobacco: Never Used  Substance and Sexual Activity  . Alcohol use: No  . Drug use: No  . Sexual activity: Never  Lifestyle  . Physical activity:    Days per week: Not on file    Minutes per session: Not on file  . Stress: Not on file  Relationships  . Social connections:    Talks on phone: Not on file    Gets together: Not on file    Attends religious service: Not on file    Active member of club or organization: Not on file  Attends meetings of clubs or organizations: Not on file    Relationship status: Not on file  Other Topics Concern  . Not on file  Social History Narrative  . Not on file   Additional Social History:   Sleep: Fair  Appetite:  Fair  Current Medications: Current Facility-Administered Medications  Medication Dose Route Frequency Provider Last Rate Last Dose  . acetaminophen (TYLENOL) tablet 650 mg  650 mg Oral Q6H PRN Clapacs, John T, MD      . alum & mag hydroxide-simeth (MAALOX/MYLANTA) 200-200-20 MG/5ML suspension 30 mL  30 mL Oral Q4H PRN Clapacs, John T, MD      . guaiFENesin (ROBITUSSIN) 100 MG/5ML solution 100 mg  5  mL Oral Q6H PRN Clapacs, John T, MD   100 mg at 05/21/18 0816  . haloperidol (HALDOL) tablet 2 mg  2 mg Oral Q6H PRN Angellee Cohill, MD      . hydrOXYzine (ATARAX/VISTARIL) tablet 50 mg  50 mg Oral TID PRN Clapacs, Jackquline Denmark, MD   50 mg at 05/23/18 2248  . ipratropium (ATROVENT) nebulizer solution 0.5 mg  0.5 mg Nebulization Q6H PRN Pucilowska, Jolanta B, MD   0.5 mg at 05/23/18 1843  . levalbuterol (XOPENEX) nebulizer solution 1.25 mg  1.25 mg Nebulization Q6H PRN Pucilowska, Jolanta B, MD   1.25 mg at 05/23/18 1843  . magnesium hydroxide (MILK OF MAGNESIA) suspension 30 mL  30 mL Oral Daily PRN Clapacs, John T, MD      . mometasone-formoterol (DULERA) 100-5 MCG/ACT inhaler 2 puff  2 puff Inhalation BID Pucilowska, Jolanta B, MD   2 puff at 05/24/18 0833  . pantoprazole (PROTONIX) EC tablet 40 mg  40 mg Oral Daily Clapacs, Jackquline Denmark, MD   40 mg at 05/24/18 0831  . QUEtiapine (SEROQUEL) tablet 300 mg  300 mg Oral QHS Pucilowska, Jolanta B, MD   300 mg at 05/23/18 2154  . verapamil (CALAN-SR) CR tablet 240 mg  240 mg Oral QHS Clapacs, Jackquline Denmark, MD   240 mg at 05/23/18 2115    Lab Results:  No results found for this or any previous visit (from the past 48 hour(s)).  Blood Alcohol level:  Lab Results  Component Value Date   ETH <10 05/13/2018    Metabolic Disorder Labs: Lab Results  Component Value Date   HGBA1C 5.2 05/19/2018   MPG 103 05/19/2018   MPG 96.8 09/11/2017   Lab Results  Component Value Date   PROLACTIN 30.2 04/12/2011   Lab Results  Component Value Date   CHOL 106 05/19/2018   TRIG 64 05/19/2018   HDL 37 (L) 05/19/2018   CHOLHDL 2.9 05/19/2018   VLDL 13 05/19/2018   LDLCALC 56 05/19/2018   LDLCALC 53 09/11/2017    Physical Findings: AIMS: Facial and Oral Movements Muscles of Facial Expression: None, normal Lips and Perioral Area: None, normal Jaw: None, normal Tongue: None, normal,Extremity Movements Upper (arms, wrists, hands, fingers): None, normal Lower (legs, knees,  ankles, toes): None, normal, Trunk Movements Neck, shoulders, hips: None, normal, Overall Severity Severity of abnormal movements (highest score from questions above): None, normal Incapacitation due to abnormal movements: None, normal Patient's awareness of abnormal movements (rate only patient's report): No Awareness, Dental Status Current problems with teeth and/or dentures?: No Does patient usually wear dentures?: No  CIWA:    COWS:     Musculoskeletal: Strength & Muscle Tone: within normal limits Gait & Station: normal Patient leans: N/A  Psychiatric Specialty Exam: Physical Exam  Nursing note and vitals reviewed. Psychiatric: Her speech is normal. Her affect is blunt. She is actively hallucinating. Thought content is paranoid. Cognition and memory are normal. She expresses impulsivity.    Review of Systems  Neurological: Negative.   Psychiatric/Behavioral: Positive for hallucinations.  All other systems reviewed and are negative.   Blood pressure 115/70, pulse 99, temperature 98.5 F (36.9 C), temperature source Oral, resp. rate 16, height 5\' 5"  (1.651 m), weight 72.6 kg, SpO2 99 %.Body mass index is 26.63 kg/m.  General Appearance: Casual  Eye Contact:  Good  Speech:  Clear and Coherent  Volume:  Increased  Mood:  Euthymic  Affect:  Appropriate and Congruent  Thought Process:  Goal Directed and Descriptions of Associations: Intact  Orientation:  Full (Time, Place, and Person)  Thought Content:  Hallucinations: Auditory and Paranoid Ideation  Suicidal Thoughts:  No  Homicidal Thoughts:  No  Memory:  Immediate;   Fair Recent;   Fair Remote;   Fair  Judgement:  Poor  Insight:  Lacking  Psychomotor Activity:  Normal  Concentration:  Concentration: Fair and Attention Span: Fair  Recall:  [employer]Fair  Fund of Knowledge:  Fair  Language:  Fair  Akathisia:  No  Handed:  Right  AIMS (if indicated):     Assets:  Communication Skills Desire for Improvement Financial  Resources/Insurance Housing Physical Health Resilience Social Support  ADL's:  Intact  Cognition:  WNL  Sleep:  Number of Hours: 6.45     Treatment Plan Summary: Daily contact with patient to assess and evaluate symptoms and progress in treatment and Medication management   Ms. [name] is a 22 year old female with a history of depression and mood instability adfmitted for agitation and threatening behavior at the group home and homicidal and suicidal ideation with a plan to cut herself.   #Suicidal ideation resolved, atill homicidal -patient able to contract for safety in the hospital -denied SI or HI in hosptial  #Mood and psychosis, impoving -continueAbilify manitena400 mg monthlyinjections,received her shot this month already, date unknown -continue Seroquel 300 mg nightly to address hallucinations -will provide haldol 2mg  po q6h prn for worsening AH. Pt did not want seroquel prn because the worry of sedation.   #Insomnia -- continue Seroquel 300mg  qhs.   #Asthma, new onset cough, afebrile -continue Albuterol inhaler PRN -continue Xopenex nebulizer PRN -continue Dulera BID -chest Xray is negative  #GERD -continue Protonix 40 mg daily  #HTN -Verapamil 240 mg daily  #H/O hypoglycemia -CBG normal  #Labs -lipid panel, TSH, A1Care normal -EKG reviewed, NSR with QTc 437 -pregnancy testis negative  #Social -incompetent adult -mother is the guardian  #Dispsition -discharge back to her group home -follow upwith her regular provider  Janaisha Tolsma, MD 05/24/2018, 10:06 AM

## 2018-05-24 NOTE — Progress Notes (Signed)
Received Brittany Palmer this AM after her breakfast, she was compliant with her medication. She endorsed feeling only anxious this AM and rated it 2/10 on her self inventory form. She is coughing and respiratory was called for a nebulizer treatment with good results.

## 2018-05-24 NOTE — [facility] Group Notes (Signed)
LCSW Group Therapy Note 05/24/2018 1:15pm  Type of Therapy and Topic: Group Therapy: Feelings Around Returning Home & Establishing a Supportive Framework and Supporting Oneself When Supports Not Available  Participation Level: Did Not Attend  Description of Group:  Patients first processed thoughts and feelings about upcoming discharge. These included fears of upcoming changes, lack of change, new living environments, judgements and expectations from others and overall stigma of mental health issues. The group then discussed the definition of a supportive framework, what that looks and feels like, and how do to discern it from an unhealthy non-supportive network. The group identified different types of supports as well as what to do when your family/friends are less than helpful or unavailable  Therapeutic Goals  1. Patient will identify one healthy supportive network that they can use at discharge. 2. Patient will identify one factor of a supportive framework and how to tell it from an unhealthy network. 3. Patient able to identify one coping skill to use when they do not have positive supports from others. 4. Patient will demonstrate ability to communicate their needs through discussion and/or role plays.  Summary of Patient Progress:  Pt was invited to attend group but chose not to attend. CSW will continue to encourage pt to attend group throughout their admission.   Therapeutic Modalities Cognitive Behavioral Therapy Motivational Interviewing   Glenna Brunkow  CUEBAS-COLON, LCSW 05/24/2018 10:55 AM 

## 2018-05-25 MED ORDER — ARIPIPRAZOLE ER 400 MG IM SRER: 400.0000 mg | INTRAMUSCULAR | 1 refills | Status: DC

## 2018-05-25 MED ORDER — ARIPIPRAZOLE ER 400 MG IM SRER: 400.0000 mg | INTRAMUSCULAR | Status: DC

## 2018-05-25 MED ORDER — QUETIAPINE FUMARATE 300 MG PO TABS: 300.0000 mg | ORAL_TABLET | Freq: Every day | ORAL | 1 refills | Status: DC

## 2018-05-25 MED ORDER — MOMETASONE FURO-FORMOTEROL FUM 100-5 MCG/ACT IN AERO: 2.0000 | INHALATION_SPRAY | Freq: Two times a day (BID) | RESPIRATORY_TRACT | 1 refills | Status: DC

## 2018-05-25 MED ORDER — HYDROXYZINE HCL 50 MG PO TABS: 50.0000 mg | ORAL_TABLET | Freq: Three times a day (TID) | ORAL | 1 refills | Status: DC | PRN

## 2018-05-25 NOTE — Plan of Care (Signed)
Pt. Verbalizes understanding of provided education, more improved. Pt. Affect bright this evening, with reported improved mood overall. Pt. Participation in unit activities and groups appropriate. Pt. Complaint with medications. Pt. Denies SI/HI, contracts for safety.    Problem: Education: Goal: Knowledge of [place] [facility] Education information/materials will improve Outcome: Progressing Goal: Emotional status will improve Outcome: Progressing Goal: Mental status will improve Outcome: Progressing   Problem: Activity: Goal: Interest or engagement in activities will improve Outcome: Progressing   Problem: Health Behavior/Discharge Planning: Goal: Compliance with treatment plan for underlying cause of condition will improve Outcome: Progressing   Problem: Safety: Goal: Periods of time without injury will increase Outcome: Progressing   Problem: Health Behavior/Discharge Planning: Goal: Compliance with therapeutic regimen will improve Outcome: Progressing   Problem: Safety: Goal: Ability to disclose and discuss suicidal ideas will improve Outcome: Progressing

## 2018-05-25 NOTE — [facility] Suicide Risk Assessment (Signed)
[facility] INPATIENT:  Family/Significant Other Suicide Prevention Education  Suicide Prevention Education:  Contact Attempts: Brittany Palmer, Mother and guardian, (name of family member/significant other) has been identified by the patient as the family member/significant other with whom the patient will be residing, and identified as the person(s) who will aid the patient in the event of a mental health crisis.  With written consent from the patient, two attempts were made to provide suicide prevention education, prior to and/or following the patient's discharge.  We were unsuccessful in providing suicide prevention education.  A suicide education pamphlet was given to the patient to share with family/significant other.  Date and time of first attempt: 9/17 LVM Date and time of second attempt:9/23 LVM  Brittany MacSara P Bernhardt Riemenschneider, LCSW 05/25/2018, 6:00 PM

## 2018-05-25 NOTE — Progress Notes (Signed)
D: Pt denies SI/HI, contracts for safety. Pt. Continues to endorse auditory hallucinations, but to a lesser degree this evening. Pt. Reports, "I just had them earlier today, but not now", "the voices aren't commanding me to do anything, just a woman's voice saying my name today". Pt is pleasant and cooperative, with bright affect. Pt. has no Complaints, breathing improved with staff interventions.  Patient Interactions with staff and peers appropriate. Anxiety reported, "5/10" depression 0. Denies pain this evening.   A: Q x 15 minute observation checks were completed for safety. Patient was provided with education, needs reinforcement.  Patient was given/offered medications per orders. Patient  was encourage to attend groups, participate in unit activities and continue with plan of care. Pt. Chart and plans of care reviewed. Pt. Given support and encouragement.   R: Patient is complaint with medication and unit procedures.              Precautionary checks every 15 minutes for safety maintained, room free of safety hazards, patient sustains no injury or falls during this shift. Will endorse care to next shift.

## 2018-05-25 NOTE — Progress Notes (Addendum)
Received Brittany Palmer this AM after breakfast, she was compliant with her medication. She endorsed feeling a little anxious and rated it 2/10 on her Self Inventory Form. The voices only comes at night. She denied feeling suicidal and feels safe to return to her group home.

## 2018-05-25 NOTE — [facility] Suicide Risk Assessment (Signed)
[facility]   Principal Problem: Bipolar I disorder, current or most recent episode depressed, with psychotic features [facility](HCC) Discharge Diagnoses:  Patient Active Problem List   Diagnosis Date Noted  . Bipolar I disorder, current or most recent episode depressed, with psychotic features (HCC) [F31.5] 05/19/2018    Priority: High  . HTN (hypertension) [I10] 05/19/2018  . GERD (gastroesophageal reflux disease) [K21.9] 05/19/2018  . Asthma [J45.909] 05/19/2018  . MDD (major depressive disorder), severe (HCC) [F32.2] 09/09/2017  . Autism spectrum disorder [F84.0] 05/17/2017  . MDD (major depressive disorder), recurrent severe, without psychosis (HCC) [F33.2] 05/16/2017    Total Time spent with patient: 20 minutes  Musculoskeletal: Strength & Muscle Tone: within normal limits Gait & Station: normal Patient leans: N/A  Psychiatric Specialty Exam: Review of Systems  Neurological: Negative.   Psychiatric/Behavioral: Negative.   All other systems reviewed and are negative.   Blood pressure 112/68, pulse 88, temperature 97.8 F (36.6 C), temperature source Oral, resp. rate 18, height 5\' 5"  (1.651 m), weight 72.6 kg, SpO2 99 %.Body mass index is 26.63 kg/m.  General Appearance: Casual  Eye Contact::  Good  Speech:  Clear and Coherent409  Volume:  Normal  Mood:  Euthymic  Affect:  Appropriate  Thought Process:  Goal Directed and Descriptions of Associations: Intact  Orientation:  Full (Time, Place, and Person)  Thought Content:  WDL  Suicidal Thoughts:  No  Homicidal Thoughts:  No  Memory:  Immediate;   Fair Recent;   Fair Remote;   Fair  Judgement:  Poor  Insight:  Lacking  Psychomotor Activity:  Normal  Concentration:  Fair  Recall:  [employer]Fair  Fund of Knowledge:Fair  Language: Fair  Akathisia:  No  Handed:  Right  AIMS (if indicated):     Assets:  Communication Skills Desire for Improvement Financial Resources/Insurance Housing Physical  Health Resilience Social Support  Sleep:  Number of Hours: 7  Cognition: WNL  ADL's:  Intact   Mental Status Per Nursing Assessment::   On Admission:  Suicidal ideation indicated by patient  Demographic Factors:  Caucasian  Loss Factors: Loss of significant relationship and NA  Historical Factors: Family history of mental illness or substance abuse and Impulsivity  Risk Reduction Factors:   Sense of responsibility to family, Living with another person, especially a relative, Positive social support and Positive therapeutic relationship  Continued Clinical Symptoms:  Bipolar Disorder:   Depressive phase  Cognitive Features That Contribute To Risk:  None    Suicide Risk:  Minimal: No identifiable suicidal ideation.  Patients presenting with no risk factors but with morbid ruminations; may be classified as minimal risk based on the severity of the depressive symptoms  Follow-up Information    [facility] Go on 06/03/2018.   Why:  2:45 pm for hopsital follow up. Contact information: [address] [place] [address] [phone]           Plan Of Care/Follow-up recommendations:  Activity:  as tolerated Diet:  low sodium heart healthy Other:  keep folow up appointments  Kristine LineaJolanta Shloime Keilman, MD 05/25/2018, 3:58 PM

## 2018-05-25 NOTE — Progress Notes (Signed)
Patient ID: [name], female   DOB: 12-Feb-1996, 22 y.o.   MRN: [id number]  Discharge Note:  Patient denies SI/HI/AVH at this time. Discharge instructions, AVS, prescriptions, and transition record gone over with patient. Patient agrees to comply with medication management, follow-up visit, and outpatient therapy. Patient belongings returned to patient. Patient questions and concerns addressed and answered. Patient ambulatory off unit. Patient discharged to home with Group Home staff member.

## 2018-05-25 NOTE — Progress Notes (Signed)
Recreation Therapy Notes  Date: 05/25/2018  Time: 9:30 am  Location: Craft Room  Behavioral response: Appropriate  Intervention Topic: Problem Solving  Discussion/Intervention:  Group content on today was focused on problem solving. The group described what problem solving is. Patients expressed how problems affect them and how they deal with problems. Individuals identified healthy ways to deal with problems. Patients explained what normally happens to them when they do not deal with problems. The group expressed reoccurring problems for them. The group participated in the intervention "Ways to Solve problems" where patients were given a chance to explore different ways to solve problems.  Clinical Observations/Feedback:  Patient came to group and defined problem solving as trying to find a solution. Individual was social with peers and staff while participating in the intervention. Brittany Palmer LRT/CTRS         Brittany Palmer 05/25/2018 12:42 PM

## 2018-05-25 NOTE — Progress Notes (Signed)
  [facility] :  Will you be returning to the same living situation after discharge:  Yes,   [facility] At discharge, do you have transportation home?: Yes,    Do you have the ability to pay for your medications: Yes,     Release of information consent forms completed and in the chart;  Patient's signature needed at discharge.  Patient to Follow up at: Follow-up Information    [facility] on 06/03/2018.   Why:  2:45 pm for hopsital follow up. Contact information: [address] [place] [address] [phone]           Next level of care provider [facility]:no  Safety Planning and Suicide Prevention discussed: Yes,        Has patient been referred to the Quitline?: Patient refused referral  Patient has been referred for addiction treatment: Yes  Glennon MacSara P Iker Nuttall, LCSW 05/25/2018, 6:01 PM

## 2018-05-25 NOTE — [place] FL2 (Signed)
[place] MEDICAID FL2 LEVEL OF CARE SCREENING TOOL     IDENTIFICATION  Patient Name: Brittany Palmer Birthdate: 1996-03-26 Sex: female Admission Date (Current Location): 05/19/2018  [place] and [place] Number:  [address]  [id number] [facility] Facility and Address:  [facility], [address] [place], [place] [address]      Provider Number: [id number]  Attending Physician Name and Address:  Shari Prows, MD  Relative Name and Phone Number:  Reita Shindler, Mother and Guardian [phone]    Current Level of Care: Hospital Recommended Level of Care: Other (Comment)(Group Home) Prior Approval Number:    Date Approved/Denied:   PASRR Number:    Discharge Plan: Other (Comment)(Group Home)    Current Diagnoses: Patient Active Problem List   Diagnosis Date Noted  . Severe recurrent major depression with psychotic features (HCC) 05/19/2018  . HTN (hypertension) 05/19/2018  . GERD (gastroesophageal reflux disease) 05/19/2018  . Asthma 05/19/2018  . MDD (major depressive disorder), severe (HCC) 09/09/2017  . Autism spectrum disorder 05/17/2017  . MDD (major depressive disorder), recurrent severe, without psychosis (HCC) 05/16/2017    Orientation RESPIRATION BLADDER Height & Weight     Self, Time, Situation, Place  Normal Continent Weight: 160 lb (72.6 kg) Height:  5\' 5"  (165.1 cm)  BEHAVIORAL SYMPTOMS/MOOD NEUROLOGICAL BOWEL NUTRITION STATUS      Continent    AMBULATORY STATUS COMMUNICATION OF NEEDS Skin   Independent Verbally Normal                       Personal Care Assistance Level of Assistance  Bathing, Feeding, Dressing Bathing Assistance: Independent Feeding assistance: Independent Dressing Assistance: Independent     Functional Limitations Info  Sight, Hearing, Speech Sight Info: Adequate Hearing Info: Adequate Speech Info: Adequate    SPECIAL CARE FACTORS FREQUENCY                       Contractures Contractures  Info: Not present    Additional Factors Info                  Current Medications (05/25/2018):  This is the current hospital active medication list Current Facility-Administered Medications  Medication Dose Route Frequency Provider Last Rate Last Dose  . acetaminophen (TYLENOL) tablet 650 mg  650 mg Oral Q6H PRN Clapacs, John T, MD      . alum & mag hydroxide-simeth (MAALOX/MYLANTA) 200-200-20 MG/5ML suspension 30 mL  30 mL Oral Q4H PRN Clapacs, John T, MD      . guaiFENesin (ROBITUSSIN) 100 MG/5ML solution 100 mg  5 mL Oral Q6H PRN Clapacs, John T, MD   100 mg at 05/21/18 0816  . haloperidol (HALDOL) tablet 2 mg  2 mg Oral Q6H PRN He, Jun, MD      . hydrOXYzine (ATARAX/VISTARIL) tablet 50 mg  50 mg Oral TID PRN Clapacs, Jackquline Denmark, MD   50 mg at 05/23/18 2248  . ipratropium (ATROVENT) nebulizer solution 0.5 mg  0.5 mg Nebulization Q6H PRN Pucilowska, Jolanta B, MD   0.5 mg at 05/24/18 1409  . levalbuterol (XOPENEX) nebulizer solution 1.25 mg  1.25 mg Nebulization Q6H PRN Pucilowska, Jolanta B, MD   1.25 mg at 05/24/18 1409  . magnesium hydroxide (MILK OF MAGNESIA) suspension 30 mL  30 mL Oral Daily PRN Clapacs, John T, MD      . mometasone-formoterol (DULERA) 100-5 MCG/ACT inhaler 2 puff  2 puff Inhalation BID Pucilowska, Ellin Goodie, MD  2 puff at 05/25/18 0915  . pantoprazole (PROTONIX) EC tablet 40 mg  40 mg Oral Daily Clapacs, Jackquline DenmarkJohn T, MD   40 mg at 05/25/18 0914  . QUEtiapine (SEROQUEL) tablet 300 mg  300 mg Oral QHS Pucilowska, Jolanta B, MD   300 mg at 05/24/18 2125  . verapamil (CALAN-SR) CR tablet 240 mg  240 mg Oral QHS Clapacs, Jackquline DenmarkJohn T, MD   240 mg at 05/24/18 2125     Discharge Medications: Please see discharge summary for a list of discharge medications.  Relevant Imaging Results:  Relevant Lab Results:   Additional Information    Glennon MacSara P Romie Tay, LCSW

## 2018-05-25 NOTE — Progress Notes (Signed)
Recreation Therapy Notes  INPATIENT RECREATION TR PLAN  Patient Details Name: Brittany Palmer MRN: [id number] DOB: 12/05/1995 Today's Date: 05/25/2018  Rec Therapy Plan Is patient appropriate for Therapeutic Recreation?: Yes Treatment times per week: at least 3 Estimated Length of Stay: 5-7 days TR Treatment/Interventions: Group participation (Comment)  Discharge Criteria Pt will be discharged from therapy if:: Discharged Treatment plan/goals/alternatives discussed and agreed upon by:: Patient/family  Discharge Summary Short term goals set:  Patient will identify 3 positive coping skills strategies to use post d/c within 5 recreation therapy group sessions Short term goals met: Complete Progress toward goals comments: Groups attended Which groups?: Other (Comment), Coping skills, Self-esteem(Problem Solving, Team work) Reason goals not met: N/A Therapeutic equipment acquired: N/A Reason patient discharged from therapy: Discharge from hospital Pt/family agrees with progress & goals achieved: Yes Date patient discharged from therapy: 05/25/18      05/25/2018, 1:29 PM  
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
## 2018-05-26 DIAGNOSIS — Z79899 Other long term (current) drug therapy: Secondary | ICD-10-CM | POA: Diagnosis not present

## 2018-06-03 DIAGNOSIS — F319 Bipolar disorder, unspecified: Secondary | ICD-10-CM | POA: Diagnosis not present

## 2018-06-09 DIAGNOSIS — K5900 Constipation, unspecified: Secondary | ICD-10-CM | POA: Diagnosis not present

## 2018-06-09 DIAGNOSIS — E663 Overweight: Secondary | ICD-10-CM | POA: Diagnosis not present

## 2018-06-09 DIAGNOSIS — Z6826 Body mass index (BMI) 26.0-26.9, adult: Secondary | ICD-10-CM | POA: Diagnosis not present

## 2018-06-09 DIAGNOSIS — F3181 Bipolar II disorder: Secondary | ICD-10-CM | POA: Diagnosis not present

## 2018-06-22 DIAGNOSIS — F319 Bipolar disorder, unspecified: Secondary | ICD-10-CM | POA: Diagnosis not present

## 2018-06-23 DIAGNOSIS — F39 Unspecified mood [affective] disorder: Secondary | ICD-10-CM | POA: Diagnosis not present

## 2018-06-23 DIAGNOSIS — R441 Visual hallucinations: Secondary | ICD-10-CM | POA: Diagnosis not present

## 2018-06-23 DIAGNOSIS — J45909 Unspecified asthma, uncomplicated: Secondary | ICD-10-CM | POA: Diagnosis not present

## 2018-06-23 DIAGNOSIS — F79 Unspecified intellectual disabilities: Secondary | ICD-10-CM | POA: Diagnosis not present

## 2018-06-23 DIAGNOSIS — K219 Gastro-esophageal reflux disease without esophagitis: Secondary | ICD-10-CM | POA: Diagnosis not present

## 2018-06-23 DIAGNOSIS — R45851 Suicidal ideations: Secondary | ICD-10-CM | POA: Diagnosis not present

## 2018-06-23 DIAGNOSIS — R0781 Pleurodynia: Secondary | ICD-10-CM | POA: Diagnosis not present

## 2018-06-23 DIAGNOSIS — R630 Anorexia: Secondary | ICD-10-CM | POA: Diagnosis not present

## 2018-06-23 DIAGNOSIS — F329 Major depressive disorder, single episode, unspecified: Secondary | ICD-10-CM | POA: Diagnosis not present

## 2018-06-23 DIAGNOSIS — E162 Hypoglycemia, unspecified: Secondary | ICD-10-CM | POA: Diagnosis not present

## 2018-06-23 DIAGNOSIS — F840 Autistic disorder: Secondary | ICD-10-CM | POA: Diagnosis not present

## 2018-06-23 DIAGNOSIS — I10 Essential (primary) hypertension: Secondary | ICD-10-CM | POA: Diagnosis not present

## 2018-06-23 DIAGNOSIS — I499 Cardiac arrhythmia, unspecified: Secondary | ICD-10-CM | POA: Diagnosis not present

## 2018-06-23 DIAGNOSIS — F88 Other disorders of psychological development: Secondary | ICD-10-CM | POA: Diagnosis not present

## 2018-06-24 DIAGNOSIS — F88 Other disorders of psychological development: Secondary | ICD-10-CM | POA: Diagnosis not present

## 2018-06-24 DIAGNOSIS — F329 Major depressive disorder, single episode, unspecified: Secondary | ICD-10-CM | POA: Diagnosis not present

## 2018-06-24 DIAGNOSIS — R45851 Suicidal ideations: Secondary | ICD-10-CM | POA: Diagnosis not present

## 2018-06-25 DIAGNOSIS — F329 Major depressive disorder, single episode, unspecified: Secondary | ICD-10-CM | POA: Diagnosis not present

## 2018-06-26 DIAGNOSIS — K219 Gastro-esophageal reflux disease without esophagitis: Secondary | ICD-10-CM | POA: Diagnosis not present

## 2018-06-26 DIAGNOSIS — I10 Essential (primary) hypertension: Secondary | ICD-10-CM | POA: Diagnosis not present

## 2018-06-26 DIAGNOSIS — F88 Other disorders of psychological development: Secondary | ICD-10-CM | POA: Diagnosis not present

## 2018-06-26 DIAGNOSIS — F39 Unspecified mood [affective] disorder: Secondary | ICD-10-CM | POA: Diagnosis not present

## 2018-06-27 DIAGNOSIS — K219 Gastro-esophageal reflux disease without esophagitis: Secondary | ICD-10-CM | POA: Diagnosis not present

## 2018-06-27 DIAGNOSIS — F39 Unspecified mood [affective] disorder: Secondary | ICD-10-CM | POA: Diagnosis not present

## 2018-06-27 DIAGNOSIS — F88 Other disorders of psychological development: Secondary | ICD-10-CM | POA: Diagnosis not present

## 2018-06-27 DIAGNOSIS — F329 Major depressive disorder, single episode, unspecified: Secondary | ICD-10-CM | POA: Diagnosis not present

## 2018-06-27 DIAGNOSIS — I10 Essential (primary) hypertension: Secondary | ICD-10-CM | POA: Diagnosis not present

## 2018-06-28 DIAGNOSIS — I10 Essential (primary) hypertension: Secondary | ICD-10-CM | POA: Diagnosis not present

## 2018-06-28 DIAGNOSIS — K219 Gastro-esophageal reflux disease without esophagitis: Secondary | ICD-10-CM | POA: Diagnosis not present

## 2018-06-28 DIAGNOSIS — F88 Other disorders of psychological development: Secondary | ICD-10-CM | POA: Diagnosis not present

## 2018-06-28 DIAGNOSIS — F329 Major depressive disorder, single episode, unspecified: Secondary | ICD-10-CM | POA: Diagnosis not present

## 2018-06-28 DIAGNOSIS — F39 Unspecified mood [affective] disorder: Secondary | ICD-10-CM | POA: Diagnosis not present

## 2018-06-29 DIAGNOSIS — F840 Autistic disorder: Secondary | ICD-10-CM | POA: Diagnosis not present

## 2018-06-29 DIAGNOSIS — I10 Essential (primary) hypertension: Secondary | ICD-10-CM | POA: Diagnosis not present

## 2018-06-29 DIAGNOSIS — F329 Major depressive disorder, single episode, unspecified: Secondary | ICD-10-CM | POA: Diagnosis not present

## 2018-06-29 DIAGNOSIS — F79 Unspecified intellectual disabilities: Secondary | ICD-10-CM | POA: Diagnosis not present

## 2018-06-30 DIAGNOSIS — F79 Unspecified intellectual disabilities: Secondary | ICD-10-CM | POA: Diagnosis not present

## 2018-06-30 DIAGNOSIS — F840 Autistic disorder: Secondary | ICD-10-CM | POA: Diagnosis not present

## 2018-06-30 DIAGNOSIS — I10 Essential (primary) hypertension: Secondary | ICD-10-CM | POA: Diagnosis not present

## 2018-06-30 DIAGNOSIS — F329 Major depressive disorder, single episode, unspecified: Secondary | ICD-10-CM | POA: Diagnosis not present

## 2018-07-01 DIAGNOSIS — F79 Unspecified intellectual disabilities: Secondary | ICD-10-CM | POA: Diagnosis not present

## 2018-07-01 DIAGNOSIS — F329 Major depressive disorder, single episode, unspecified: Secondary | ICD-10-CM | POA: Diagnosis not present

## 2018-07-01 DIAGNOSIS — F840 Autistic disorder: Secondary | ICD-10-CM | POA: Diagnosis not present

## 2018-07-01 DIAGNOSIS — I10 Essential (primary) hypertension: Secondary | ICD-10-CM | POA: Diagnosis not present

## 2018-07-02 DIAGNOSIS — I10 Essential (primary) hypertension: Secondary | ICD-10-CM | POA: Diagnosis not present

## 2018-07-02 DIAGNOSIS — F79 Unspecified intellectual disabilities: Secondary | ICD-10-CM | POA: Diagnosis not present

## 2018-07-02 DIAGNOSIS — F329 Major depressive disorder, single episode, unspecified: Secondary | ICD-10-CM | POA: Diagnosis not present

## 2018-07-02 DIAGNOSIS — F840 Autistic disorder: Secondary | ICD-10-CM | POA: Diagnosis not present

## 2018-07-03 DIAGNOSIS — F840 Autistic disorder: Secondary | ICD-10-CM | POA: Diagnosis not present

## 2018-07-03 DIAGNOSIS — F79 Unspecified intellectual disabilities: Secondary | ICD-10-CM | POA: Diagnosis not present

## 2018-07-03 DIAGNOSIS — I10 Essential (primary) hypertension: Secondary | ICD-10-CM | POA: Diagnosis not present

## 2018-07-03 DIAGNOSIS — F329 Major depressive disorder, single episode, unspecified: Secondary | ICD-10-CM | POA: Diagnosis not present

## 2018-07-04 DIAGNOSIS — F79 Unspecified intellectual disabilities: Secondary | ICD-10-CM | POA: Diagnosis not present

## 2018-07-04 DIAGNOSIS — F840 Autistic disorder: Secondary | ICD-10-CM | POA: Diagnosis not present

## 2018-07-04 DIAGNOSIS — I10 Essential (primary) hypertension: Secondary | ICD-10-CM | POA: Diagnosis not present

## 2018-07-04 DIAGNOSIS — F329 Major depressive disorder, single episode, unspecified: Secondary | ICD-10-CM | POA: Diagnosis not present

## 2018-07-05 DIAGNOSIS — I10 Essential (primary) hypertension: Secondary | ICD-10-CM | POA: Diagnosis not present

## 2018-07-05 DIAGNOSIS — F329 Major depressive disorder, single episode, unspecified: Secondary | ICD-10-CM | POA: Diagnosis not present

## 2018-07-05 DIAGNOSIS — F79 Unspecified intellectual disabilities: Secondary | ICD-10-CM | POA: Diagnosis not present

## 2018-07-05 DIAGNOSIS — F840 Autistic disorder: Secondary | ICD-10-CM | POA: Diagnosis not present

## 2018-07-06 DIAGNOSIS — F79 Unspecified intellectual disabilities: Secondary | ICD-10-CM | POA: Diagnosis not present

## 2018-07-06 DIAGNOSIS — F840 Autistic disorder: Secondary | ICD-10-CM | POA: Diagnosis not present

## 2018-07-06 DIAGNOSIS — I10 Essential (primary) hypertension: Secondary | ICD-10-CM | POA: Diagnosis not present

## 2018-07-06 DIAGNOSIS — F329 Major depressive disorder, single episode, unspecified: Secondary | ICD-10-CM | POA: Diagnosis not present

## 2018-07-07 DIAGNOSIS — F329 Major depressive disorder, single episode, unspecified: Secondary | ICD-10-CM | POA: Diagnosis not present

## 2018-07-22 DIAGNOSIS — F840 Autistic disorder: Secondary | ICD-10-CM | POA: Diagnosis not present

## 2018-07-22 DIAGNOSIS — F79 Unspecified intellectual disabilities: Secondary | ICD-10-CM | POA: Diagnosis not present

## 2018-07-22 DIAGNOSIS — F39 Unspecified mood [affective] disorder: Secondary | ICD-10-CM | POA: Diagnosis not present

## 2018-07-22 DIAGNOSIS — F3341 Major depressive disorder, recurrent, in partial remission: Secondary | ICD-10-CM | POA: Diagnosis not present

## 2018-08-14 DIAGNOSIS — F39 Unspecified mood [affective] disorder: Secondary | ICD-10-CM | POA: Diagnosis not present

## 2018-08-14 DIAGNOSIS — F840 Autistic disorder: Secondary | ICD-10-CM | POA: Diagnosis not present

## 2018-08-14 DIAGNOSIS — F79 Unspecified intellectual disabilities: Secondary | ICD-10-CM | POA: Diagnosis not present

## 2018-08-24 DIAGNOSIS — J45901 Unspecified asthma with (acute) exacerbation: Secondary | ICD-10-CM | POA: Diagnosis not present

## 2018-09-11 DIAGNOSIS — F79 Unspecified intellectual disabilities: Secondary | ICD-10-CM | POA: Diagnosis not present

## 2018-09-11 DIAGNOSIS — F840 Autistic disorder: Secondary | ICD-10-CM | POA: Diagnosis not present

## 2018-09-11 DIAGNOSIS — F3341 Major depressive disorder, recurrent, in partial remission: Secondary | ICD-10-CM | POA: Diagnosis not present

## 2018-09-25 DIAGNOSIS — F840 Autistic disorder: Secondary | ICD-10-CM | POA: Diagnosis not present

## 2018-09-25 DIAGNOSIS — F3341 Major depressive disorder, recurrent, in partial remission: Secondary | ICD-10-CM | POA: Diagnosis not present

## 2018-09-25 DIAGNOSIS — F79 Unspecified intellectual disabilities: Secondary | ICD-10-CM | POA: Diagnosis not present

## 2018-10-21 DIAGNOSIS — Z79899 Other long term (current) drug therapy: Secondary | ICD-10-CM | POA: Diagnosis not present

## 2018-10-21 DIAGNOSIS — F39 Unspecified mood [affective] disorder: Secondary | ICD-10-CM | POA: Diagnosis not present

## 2018-10-21 DIAGNOSIS — F79 Unspecified intellectual disabilities: Secondary | ICD-10-CM | POA: Diagnosis not present

## 2018-10-21 DIAGNOSIS — F840 Autistic disorder: Secondary | ICD-10-CM | POA: Diagnosis not present

## 2018-10-27 DIAGNOSIS — I10 Essential (primary) hypertension: Secondary | ICD-10-CM | POA: Diagnosis not present

## 2018-10-27 DIAGNOSIS — H539 Unspecified visual disturbance: Secondary | ICD-10-CM | POA: Diagnosis not present

## 2018-10-27 DIAGNOSIS — Z1329 Encounter for screening for other suspected endocrine disorder: Secondary | ICD-10-CM | POA: Diagnosis not present

## 2018-10-27 DIAGNOSIS — E161 Other hypoglycemia: Secondary | ICD-10-CM | POA: Diagnosis not present

## 2018-10-27 DIAGNOSIS — R51 Headache: Secondary | ICD-10-CM | POA: Diagnosis not present

## 2018-11-10 DIAGNOSIS — F3181 Bipolar II disorder: Secondary | ICD-10-CM | POA: Diagnosis not present

## 2018-11-10 DIAGNOSIS — K219 Gastro-esophageal reflux disease without esophagitis: Secondary | ICD-10-CM | POA: Diagnosis not present

## 2018-11-10 DIAGNOSIS — I10 Essential (primary) hypertension: Secondary | ICD-10-CM | POA: Diagnosis not present

## 2018-11-10 DIAGNOSIS — I482 Chronic atrial fibrillation, unspecified: Secondary | ICD-10-CM | POA: Diagnosis not present

## 2018-11-11 DIAGNOSIS — F3341 Major depressive disorder, recurrent, in partial remission: Secondary | ICD-10-CM | POA: Diagnosis not present

## 2018-11-11 DIAGNOSIS — F79 Unspecified intellectual disabilities: Secondary | ICD-10-CM | POA: Diagnosis not present

## 2018-11-11 DIAGNOSIS — F840 Autistic disorder: Secondary | ICD-10-CM | POA: Diagnosis not present

## 2019-04-12 DIAGNOSIS — H66009 Acute suppurative otitis media without spontaneous rupture of ear drum, unspecified ear: Secondary | ICD-10-CM | POA: Diagnosis not present

## 2019-05-05 DIAGNOSIS — F39 Unspecified mood [affective] disorder: Secondary | ICD-10-CM | POA: Diagnosis not present

## 2019-05-05 DIAGNOSIS — F840 Autistic disorder: Secondary | ICD-10-CM | POA: Diagnosis not present

## 2019-05-05 DIAGNOSIS — F79 Unspecified intellectual disabilities: Secondary | ICD-10-CM | POA: Diagnosis not present

## 2019-06-17 DIAGNOSIS — R000 Tachycardia, unspecified: Secondary | ICD-10-CM | POA: Diagnosis not present

## 2019-06-17 DIAGNOSIS — R8271 Bacteriuria: Secondary | ICD-10-CM | POA: Diagnosis not present

## 2019-06-17 DIAGNOSIS — T50902A Poisoning by unspecified drugs, medicaments and biological substances, intentional self-harm, initial encounter: Secondary | ICD-10-CM | POA: Diagnosis not present

## 2019-06-17 DIAGNOSIS — T450X2A Poisoning by antiallergic and antiemetic drugs, intentional self-harm, initial encounter: Secondary | ICD-10-CM | POA: Diagnosis not present

## 2019-06-17 DIAGNOSIS — K219 Gastro-esophageal reflux disease without esophagitis: Secondary | ICD-10-CM | POA: Diagnosis not present

## 2019-06-17 DIAGNOSIS — I10 Essential (primary) hypertension: Secondary | ICD-10-CM | POA: Diagnosis not present

## 2019-06-17 DIAGNOSIS — F418 Other specified anxiety disorders: Secondary | ICD-10-CM | POA: Diagnosis not present

## 2019-06-18 DIAGNOSIS — F418 Other specified anxiety disorders: Secondary | ICD-10-CM | POA: Diagnosis not present

## 2019-06-18 DIAGNOSIS — J449 Chronic obstructive pulmonary disease, unspecified: Secondary | ICD-10-CM | POA: Diagnosis present

## 2019-06-18 DIAGNOSIS — J45909 Unspecified asthma, uncomplicated: Secondary | ICD-10-CM | POA: Diagnosis present

## 2019-06-18 DIAGNOSIS — R000 Tachycardia, unspecified: Secondary | ICD-10-CM | POA: Diagnosis not present

## 2019-06-18 DIAGNOSIS — T50902A Poisoning by unspecified drugs, medicaments and biological substances, intentional self-harm, initial encounter: Secondary | ICD-10-CM | POA: Diagnosis not present

## 2019-06-18 DIAGNOSIS — K219 Gastro-esophageal reflux disease without esophagitis: Secondary | ICD-10-CM | POA: Diagnosis not present

## 2019-06-18 DIAGNOSIS — F250 Schizoaffective disorder, bipolar type: Secondary | ICD-10-CM | POA: Diagnosis not present

## 2019-06-18 DIAGNOSIS — F251 Schizoaffective disorder, depressive type: Secondary | ICD-10-CM | POA: Diagnosis not present

## 2019-06-18 DIAGNOSIS — R45851 Suicidal ideations: Secondary | ICD-10-CM | POA: Diagnosis present

## 2019-06-18 DIAGNOSIS — F603 Borderline personality disorder: Secondary | ICD-10-CM | POA: Diagnosis present

## 2019-06-18 DIAGNOSIS — I10 Essential (primary) hypertension: Secondary | ICD-10-CM | POA: Diagnosis present

## 2019-06-18 DIAGNOSIS — T450X2A Poisoning by antiallergic and antiemetic drugs, intentional self-harm, initial encounter: Secondary | ICD-10-CM | POA: Diagnosis not present

## 2019-06-18 DIAGNOSIS — R8271 Bacteriuria: Secondary | ICD-10-CM | POA: Diagnosis not present

## 2019-06-19 DIAGNOSIS — F250 Schizoaffective disorder, bipolar type: Secondary | ICD-10-CM | POA: Diagnosis not present

## 2019-06-20 DIAGNOSIS — F250 Schizoaffective disorder, bipolar type: Secondary | ICD-10-CM | POA: Diagnosis not present

## 2019-06-21 DIAGNOSIS — F250 Schizoaffective disorder, bipolar type: Secondary | ICD-10-CM | POA: Diagnosis not present

## 2019-06-22 DIAGNOSIS — F250 Schizoaffective disorder, bipolar type: Secondary | ICD-10-CM | POA: Diagnosis not present

## 2019-09-13 DIAGNOSIS — Z23 Encounter for immunization: Secondary | ICD-10-CM | POA: Diagnosis not present

## 2019-09-14 DIAGNOSIS — Z6827 Body mass index (BMI) 27.0-27.9, adult: Secondary | ICD-10-CM | POA: Diagnosis not present

## 2019-09-14 DIAGNOSIS — F79 Unspecified intellectual disabilities: Secondary | ICD-10-CM | POA: Diagnosis not present

## 2019-09-14 DIAGNOSIS — F840 Autistic disorder: Secondary | ICD-10-CM | POA: Diagnosis not present

## 2019-09-14 DIAGNOSIS — K219 Gastro-esophageal reflux disease without esophagitis: Secondary | ICD-10-CM | POA: Diagnosis not present

## 2019-09-14 DIAGNOSIS — R112 Nausea with vomiting, unspecified: Secondary | ICD-10-CM | POA: Diagnosis not present

## 2019-09-14 DIAGNOSIS — J45909 Unspecified asthma, uncomplicated: Secondary | ICD-10-CM | POA: Diagnosis not present

## 2019-09-22 DIAGNOSIS — F329 Major depressive disorder, single episode, unspecified: Secondary | ICD-10-CM | POA: Diagnosis not present

## 2019-09-22 DIAGNOSIS — F209 Schizophrenia, unspecified: Secondary | ICD-10-CM | POA: Diagnosis not present

## 2019-09-22 DIAGNOSIS — G259 Extrapyramidal and movement disorder, unspecified: Secondary | ICD-10-CM | POA: Diagnosis not present

## 2019-09-22 DIAGNOSIS — F79 Unspecified intellectual disabilities: Secondary | ICD-10-CM | POA: Diagnosis not present

## 2019-09-22 DIAGNOSIS — Z6827 Body mass index (BMI) 27.0-27.9, adult: Secondary | ICD-10-CM | POA: Diagnosis not present

## 2019-10-06 DIAGNOSIS — F840 Autistic disorder: Secondary | ICD-10-CM | POA: Diagnosis not present

## 2019-10-06 DIAGNOSIS — R05 Cough: Secondary | ICD-10-CM | POA: Diagnosis not present

## 2019-10-06 DIAGNOSIS — J45909 Unspecified asthma, uncomplicated: Secondary | ICD-10-CM | POA: Diagnosis not present

## 2019-10-06 DIAGNOSIS — Z6824 Body mass index (BMI) 24.0-24.9, adult: Secondary | ICD-10-CM | POA: Diagnosis not present

## 2019-10-06 DIAGNOSIS — G4700 Insomnia, unspecified: Secondary | ICD-10-CM | POA: Diagnosis not present

## 2019-10-11 DIAGNOSIS — Z23 Encounter for immunization: Secondary | ICD-10-CM | POA: Diagnosis not present

## 2019-11-03 DIAGNOSIS — G259 Extrapyramidal and movement disorder, unspecified: Secondary | ICD-10-CM | POA: Diagnosis not present

## 2019-11-03 DIAGNOSIS — F209 Schizophrenia, unspecified: Secondary | ICD-10-CM | POA: Diagnosis not present

## 2019-11-03 DIAGNOSIS — F329 Major depressive disorder, single episode, unspecified: Secondary | ICD-10-CM | POA: Diagnosis not present

## 2019-11-03 DIAGNOSIS — Z79899 Other long term (current) drug therapy: Secondary | ICD-10-CM | POA: Diagnosis not present

## 2019-11-03 DIAGNOSIS — Z6824 Body mass index (BMI) 24.0-24.9, adult: Secondary | ICD-10-CM | POA: Diagnosis not present

## 2019-11-03 DIAGNOSIS — F79 Unspecified intellectual disabilities: Secondary | ICD-10-CM | POA: Diagnosis not present

## 2019-12-15 ENCOUNTER — Encounter (HOSPITAL_COMMUNITY): Payer: Self-pay

## 2019-12-15 ENCOUNTER — Emergency Department (HOSPITAL_COMMUNITY)
Admission: EM | Admit: 2019-12-15 | Discharge: 2019-12-17 | Disposition: A | Discharge status: Other Acute Inpatient Hospital | Payer: Medicare Other | Attending: Emergency Medicine | Admitting: Emergency Medicine

## 2019-12-15 DIAGNOSIS — F315 Bipolar disorder, current episode depressed, severe, with psychotic features: Secondary | ICD-10-CM | POA: Insufficient documentation

## 2019-12-15 DIAGNOSIS — Z79899 Other long term (current) drug therapy: Secondary | ICD-10-CM | POA: Diagnosis not present

## 2019-12-15 DIAGNOSIS — Z20822 Contact with and (suspected) exposure to covid-19: Secondary | ICD-10-CM | POA: Diagnosis not present

## 2019-12-15 DIAGNOSIS — R45851 Suicidal ideations: Secondary | ICD-10-CM | POA: Diagnosis not present

## 2019-12-15 DIAGNOSIS — Z046 Encounter for general psychiatric examination, requested by authority: Secondary | ICD-10-CM | POA: Diagnosis not present

## 2019-12-15 DIAGNOSIS — Z03818 Encounter for observation for suspected exposure to other biological agents ruled out: Secondary | ICD-10-CM | POA: Diagnosis not present

## 2019-12-15 DIAGNOSIS — IMO0002 Reserved for concepts with insufficient information to code with codable children: Secondary | ICD-10-CM

## 2019-12-15 DIAGNOSIS — I10 Essential (primary) hypertension: Secondary | ICD-10-CM | POA: Insufficient documentation

## 2019-12-15 DIAGNOSIS — F29 Unspecified psychosis not due to a substance or known physiological condition: Secondary | ICD-10-CM | POA: Diagnosis not present

## 2019-12-15 DIAGNOSIS — R440 Auditory hallucinations: Secondary | ICD-10-CM | POA: Insufficient documentation

## 2019-12-15 DIAGNOSIS — R442 Other hallucinations: Secondary | ICD-10-CM | POA: Diagnosis not present

## 2019-12-15 DIAGNOSIS — F840 Autistic disorder: Secondary | ICD-10-CM | POA: Insufficient documentation

## 2019-12-15 DIAGNOSIS — R443 Hallucinations, unspecified: Secondary | ICD-10-CM

## 2019-12-15 DIAGNOSIS — Z7289 Other problems related to lifestyle: Secondary | ICD-10-CM

## 2019-12-15 DIAGNOSIS — N390 Urinary tract infection, site not specified: Secondary | ICD-10-CM

## 2019-12-15 DIAGNOSIS — R441 Visual hallucinations: Secondary | ICD-10-CM | POA: Diagnosis present

## 2019-12-15 LAB — CBC
HCT: 42.9 % (ref 36.0–46.0)
Hemoglobin: 14.1 g/dL (ref 12.0–15.0)
MCH: 29.9 pg (ref 26.0–34.0)
MCHC: 32.9 g/dL (ref 30.0–36.0)
MCV: 90.9 fL (ref 80.0–100.0)
Platelets: 301 10*3/uL (ref 150–400)
RBC: 4.72 MIL/uL (ref 3.87–5.11)
RDW: 12.7 % (ref 11.5–15.5)
WBC: 12.3 10*3/uL — ABNORMAL HIGH (ref 4.0–10.5)
nRBC: 0 % (ref 0.0–0.2)

## 2019-12-15 LAB — RAPID URINE DRUG SCREEN, HOSP PERFORMED
Amphetamines: NOT DETECTED
Barbiturates: NOT DETECTED
Benzodiazepines: NOT DETECTED
Cocaine: NOT DETECTED
Opiates: NOT DETECTED
Tetrahydrocannabinol: NOT DETECTED

## 2019-12-15 LAB — ACETAMINOPHEN LEVEL: Acetaminophen (Tylenol), Serum: <10 ug/mL — ABNORMAL LOW (ref 10–30)

## 2019-12-15 LAB — COMPREHENSIVE METABOLIC PANEL
ALT: 22 U/L (ref 0–44)
AST: 23 U/L (ref 15–41)
Albumin: 4.4 g/dL (ref 3.5–5.0)
Alkaline Phosphatase: 58 U/L (ref 38–126)
Anion gap: 11 (ref 5–15)
BUN: 11 mg/dL (ref 6–20)
CO2: 25 mmol/L (ref 22–32)
Calcium: 9.5 mg/dL (ref 8.9–10.3)
Chloride: 105 mmol/L (ref 98–111)
Creatinine, Ser: 0.89 mg/dL (ref 0.44–1.00)
GFR calc Af Amer: >60 mL/min (ref >60)
GFR calc non Af Amer: >60 mL/min (ref >60)
Glucose, Bld: 92 mg/dL (ref 70–99)
Potassium: 3.9 mmol/L (ref 3.5–5.1)
Sodium: 141 mmol/L (ref 135–145)
Total Bilirubin: 0.6 mg/dL (ref 0.3–1.2)
Total Protein: 7.4 g/dL (ref 6.5–8.1)

## 2019-12-15 LAB — I-STAT BETA HCG BLOOD, ED (MC, WL, AP ONLY): I-stat hCG, quantitative: <5 m[IU]/mL (ref <5)

## 2019-12-15 LAB — ETHANOL: Alcohol, Ethyl (B): <10 mg/dL (ref <10)

## 2019-12-15 LAB — SALICYLATE LEVEL: Salicylate Lvl: <7 mg/dL — ABNORMAL LOW (ref 7.0–30.0)

## 2019-12-15 NOTE — ED Triage Notes (Signed)
Pt comes via [facility] from group home for SI, pt has been hearing and seeing a demonic figure named "Earley Abide" outside of her room that is trying to kill her, pt is autistic, has been cutting herself with cuticle cutters. Pt has also been refusing mediations

## 2019-12-16 LAB — URINALYSIS, ROUTINE W REFLEX MICROSCOPIC
Bilirubin Urine: NEGATIVE
Glucose, UA: NEGATIVE mg/dL
Ketones, ur: NEGATIVE mg/dL
Leukocytes,Ua: NEGATIVE
Nitrite: NEGATIVE
Protein, ur: 100 mg/dL — AB
Specific Gravity, Urine: 1.026 (ref 1.005–1.030)
pH: 9 — ABNORMAL HIGH (ref 5.0–8.0)

## 2019-12-16 LAB — RESPIRATORY PANEL BY RT PCR (FLU A&B, COVID)
Influenza A by PCR: NEGATIVE
Influenza B by PCR: NEGATIVE
SARS Coronavirus 2 by RT PCR: NEGATIVE

## 2019-12-16 MED ORDER — BUDESONIDE-FORMOTEROL FUMARATE 160-4.5 MCG/ACT IN AERO
2.0000 | INHALATION_SPRAY | Freq: Two times a day (BID) | RESPIRATORY_TRACT | Status: DC
  Filled 2019-12-16: qty 6

## 2019-12-16 MED ORDER — CEPHALEXIN 250 MG PO CAPS
500.0000 mg | ORAL_CAPSULE | Freq: Three times a day (TID) | ORAL | Status: DC
  Administered 2019-12-16 – 2019-12-17 (×3): 500 mg via ORAL
  Filled 2019-12-16 (×3): qty 2

## 2019-12-16 MED ORDER — PROMETHAZINE HCL 25 MG/ML IJ SOLN
12.5000 mg | Freq: Once | INTRAMUSCULAR | Status: AC
  Administered 2019-12-16: 12.5 mg via INTRAMUSCULAR
  Filled 2019-12-16: qty 1

## 2019-12-16 MED ORDER — ARIPIPRAZOLE 5 MG PO TABS
15.0000 mg | ORAL_TABLET | Freq: Every day | ORAL | Status: DC
  Administered 2019-12-16: 15 mg via ORAL
  Filled 2019-12-16: qty 1

## 2019-12-16 MED ORDER — QUETIAPINE FUMARATE 50 MG PO TABS
300.0000 mg | ORAL_TABLET | Freq: Every day | ORAL | Status: DC
  Administered 2019-12-16: 300 mg via ORAL
  Filled 2019-12-16: qty 2

## 2019-12-16 MED ORDER — VERAPAMIL HCL ER 240 MG PO TBCR
240.0000 mg | EXTENDED_RELEASE_TABLET | Freq: Every day | ORAL | Status: DC
  Administered 2019-12-16: 240 mg via ORAL
  Filled 2019-12-16: qty 1

## 2019-12-16 MED ORDER — ALBUTEROL SULFATE HFA 108 (90 BASE) MCG/ACT IN AERS: 2.0000 | INHALATION_SPRAY | Freq: Four times a day (QID) | RESPIRATORY_TRACT | Status: DC | PRN

## 2019-12-16 MED ORDER — ARIPIPRAZOLE 15 MG PO TABS
15.0000 mg | ORAL_TABLET | Freq: Every day | ORAL | Status: DC
  Filled 2019-12-16: qty 1

## 2019-12-16 MED ORDER — ONDANSETRON 4 MG PO TBDP
4.0000 mg | ORAL_TABLET | Freq: Once | ORAL | Status: AC
  Administered 2019-12-16: 4 mg via ORAL
  Filled 2019-12-16: qty 1

## 2019-12-16 MED ORDER — CEPHALEXIN 500 MG PO CAPS: 500.0000 mg | ORAL_CAPSULE | Freq: Three times a day (TID) | ORAL | 0 refills | Status: DC

## 2019-12-16 MED ORDER — MOMETASONE FURO-FORMOTEROL FUM 200-5 MCG/ACT IN AERO
2.0000 | INHALATION_SPRAY | Freq: Two times a day (BID) | RESPIRATORY_TRACT | Status: DC
  Administered 2019-12-16 – 2019-12-17 (×2): 2 via RESPIRATORY_TRACT
  Filled 2019-12-16 (×2): qty 8.8

## 2019-12-16 MED ORDER — FLUVOXAMINE MALEATE 50 MG PO TABS
50.0000 mg | ORAL_TABLET | Freq: Every day | ORAL | Status: DC
  Administered 2019-12-16: 50 mg via ORAL
  Filled 2019-12-16: qty 1

## 2019-12-16 NOTE — ED Notes (Signed)
PO prescribed meds delayed, pt continues to vomit after PO zofran. EDP notified, IM phenergan ordered, verified via phone with pharmacist. Provided to pt, pt calm and cooperative, makes no mention of "Hilda" from prev notes at this time.

## 2019-12-16 NOTE — BH Assessment (Signed)
Tele Assessment Note   Patient Name: Brittany Palmer MRN: [id number] Referring Physician: Virgel Manifold, MD Location of Patient: [facility] Location of Provider: In[place] is an 24 y.o. female present to [facility] from her group home after cutting herself triggered by a voice named 'Hilda.' Patient report this was a suicide attempt because Lenell Antu told her to kill herself and stop taking her medications. Patient has history of autism spectrum disorder, major depressive disorder, and Bipolar. Reported yesterday she saw 'Hilda' outside of her window. Since seeing Lenell Antu she reports Lenell Antu has been giving her commends to harm herself. Patient wrote on her left arm and scratched her left wrist with fingernail clippers. She states that she refused her evening medications yesterday and has refused medications in the ER per chart review. Denies homicidal ideations. Patient prior hospitalizations [facility], [place], [place] and [place].   Patient is pleasant and cooperative during assessment. Report she resides at [facility]. TTS attempted to contact [place]) [phone], no answer. Patient speech logical but she speaks slowly. Judgement impaired due to actively experiencing auditory/visual hallucinations, suicidal ideations, and self-report she attempted suicidal intent by cutting and scratching her arm. Thought process circumstantial. Report she sleeps good when she takes her medication. Denied substance abuse, denied experiencing traumatic events (physical/sexual/verbal) abuse.   Mordecai Maes, NP, recommend inpatient treatment    Diagnosis: F31.5  Bipolar I disorder, Current or most recent episode depressed, With psychotic features  Past Medical History:  Past Medical History:  Diagnosis Date  . Autism   . GERD (gastroesophageal reflux disease)   . Hypertension     Past Surgical History:  Procedure Laterality Date  .  TONSILLECTOMY      Family History: No family history on file.  Social History:  reports that she has never smoked. She has never used smokeless tobacco. She reports that she does not drink alcohol or use drugs.  Additional Social History:  Alcohol / Drug Use Pain Medications: see MAR Prescriptions: see MAR Over the Counter: see MAR History of alcohol / drug use?: No history of alcohol / drug abuse  CIWA: CIWA-Ar BP: (!) 139/100 Pulse Rate: 99 COWS:    Allergies:  Allergies  Allergen Reactions  . Fluoxetine Other (See Comments) and Rash    Patient reports allergy and rash across neck when trying this medication.  . Lisinopril Cough  . Risperdal [Risperidone] Other (See Comments)    Twitching of mouth     Home Medications: (Not in a hospital admission)   OB/GYN Status:  No LMP recorded.  General Assessment Data Assessment unable to be completed: Yes Reason for not completing assessment: patient in hallway bed Location of Assessment: [facility] ED TTS Assessment: In system Is this a Tele or Face-to-Face Assessment?: Tele Assessment Is this an Initial Assessment or a Re-assessment for this encounter?: Initial Assessment Patient Accompanied by:: N/A Language Other than English: No Living Arrangements: Other (Comment)(Group Home - [facility] ) What gender do you identify as?: Female Marital status: Single Living Arrangements: Group Home([facility] ) Can pt return to current living arrangement?: Yes Admission Status: Voluntary Is patient capable of signing voluntary admission?: No Referral Source: Self/Family/Friend Insurance type: Medicare     Crisis Care Plan Living Arrangements: Group Home([facility] ) Name of Psychiatrist: [place] Name of Therapist: denied   Education Status Is patient currently in school?: No Is the patient employed, unemployed or receiving disability?: Receiving disability  income  Risk to self with the past 6 months Suicidal  Ideation: Yes-Currently Present Has patient been a risk to self within the past 6 months prior to admission? : No Suicidal Intent: Yes-Currently Present Has patient had any suicidal intent within the past 6 months prior to admission? : No Is patient at risk for suicide?: Yes Suicidal Plan?: No Has patient had any suicidal plan within the past 6 months prior to admission? : No Access to Means: Yes Specify Access to Suicidal Means: household items What has been your use of drugs/alcohol within the last 12 months?: denied  Previous Attempts/Gestures: Yes How many times?: 3 Other Self Harm Risks: none report  Triggers for Past Attempts: Other (Comment)(auditory/visual hallucinations) Intentional Self Injurious Behavior: Cutting Comment - Self Injurious Behavior: cut self with finger nail clipper Family Suicide History: Unknown Recent stressful life event(s): Other (Comment)(auditory/visual hallucinations) Persecutory voices/beliefs?: Yes Depression: Yes Depression Symptoms: Feeling worthless/self pity, Loss of interest in usual pleasures(suicidal ideations, depression ) Substance abuse history and/or treatment for substance abuse?: No Suicide prevention information given to non-admitted patients: Not applicable  Risk to Others within the past 6 months Homicidal Ideation: No Does patient have any lifetime risk of violence toward others beyond the six months prior to admission? : No Thoughts of Harm to Others: No Current Homicidal Intent: No Current Homicidal Plan: No Access to Homicidal Means: No Identified Victim: n/a History of harm to others?: No Assessment of Violence: None Noted Violent Behavior Description: None Noted Does patient have access to weapons?: No Criminal Charges Pending?: No Does patient have a court date: No Is patient on probation?: No  Psychosis Hallucinations: Auditory, Visual Delusions: None noted  Mental Status Report Appearance/Hygiene: In scrubs Eye  Contact: Good Motor Activity: Freedom of movement Speech: Logical/coherent, Slow Level of Consciousness: Alert Mood: Pleasant Affect: Appropriate to circumstance Anxiety Level: None Thought Processes: Circumstantial Judgement: Impaired Orientation: Person, Place, Time, Situation Obsessive Compulsive Thoughts/Behaviors: None  Cognitive Functioning Concentration: Good Memory: Recent Intact, Remote Intact Is patient IDD: (autism spectrum disorder ) Insight: Fair Impulse Control: Poor Appetite: Good Have you had any weight changes? : No Change Sleep: No Change Vegetative Symptoms: None  ADLScreening [facility] Services) Patient's cognitive ability adequate to safely complete daily activities?: Yes Patient able to express need for assistance with ADLs?: Yes Independently performs ADLs?: Yes (appropriate for developmental age)  Prior Inpatient Therapy Prior Inpatient Therapy: Yes Prior Therapy Dates: 2019,2018,2017 Prior Therapy Facilty/Provider(s): [facility], [place], [facility] Reason for Treatment: ASD, aggression   Prior Outpatient Therapy Prior Outpatient Therapy: No Does patient have an ACCT team?: No Does patient have Intensive In-House Services?  : No Does patient have [facility] services? : No Does patient have P4CC services?: No  ADL Screening (condition at time of admission) Patient's cognitive ability adequate to safely complete daily activities?: Yes Is the patient deaf or have difficulty hearing?: No Does the patient have difficulty seeing, even when wearing glasses/contacts?: No Does the patient have difficulty concentrating, remembering, or making decisions?: No Patient able to express need for assistance with ADLs?: Yes Does the patient have difficulty dressing or bathing?: No Independently performs ADLs?: Yes (appropriate for developmental age) Does the patient have difficulty walking or climbing stairs?: No       Abuse/Neglect Assessment (Assessment  to be complete while patient is alone) Abuse/Neglect Assessment Can Be Completed: Yes Physical Abuse: Denies Verbal Abuse: Denies Sexual Abuse: Denies Exploitation of patient/patient's resources: Denies Self-Neglect: Denies     Advance Directives (  For Healthcare) Does Patient Have a Medical Advance Directive?: No Would patient like information on creating a medical advance directive?: No - Patient declined          Disposition:  Disposition Initial Assessment Completed for this Encounter: Candy Sledge, NP, recommended inpt tx)  This service was provided via telemedicine using a 2-way, interactive audio and video technology.  Names of all persons participating in this telemedicine service and their role in this encounter. Name:  Dashay Giesler Role: patient  Name:  Blane Ohara.  Role: TTS assessor  Name:  Denzil Magnuson, NP Role:   Name:  Role:     Dian Situ 12/16/2019 1:21 PM

## 2019-12-16 NOTE — BHH Counselor (Signed)
Collateral:   Unable to reach collateral [facility] Wyatt Mage) [phone]. Unable to leave message.

## 2019-12-16 NOTE — ED Notes (Signed)
Pt states she is no longer feeling nauseated, will attempt PO challenge then give medications if well tolerated

## 2019-12-16 NOTE — ED Notes (Signed)
Staff from [employer] [facility]) called to get an update. She said she can be called for any question/update [phone]

## 2019-12-16 NOTE — ED Notes (Signed)
-  PT  Vomited about ; yellow in color -EDP at bedside -she says she feels "sick" -She declined any medicine to help her -She is also reporting burning with urination -denies abdominal pain -denies flank pain

## 2019-12-16 NOTE — BHH Counselor (Signed)
TTS attempted to see patient for tele-psych assessment. Patient is in a hallway bed while she wanting for a room. TTS will try again once patient is roomed.

## 2019-12-16 NOTE — ED Notes (Signed)
Pt states that she is no longer nauseous after receiving phenergan and would like something to eat. PO challenged, successful. Continues to deny nausea. Pt accepts Keflex medication without making mention of "Earley Abide", appears suspicious when RN mentions that she is waiting for home meds from pharmacy. EDP notified of changes (not yet medically cleared as of this time.)

## 2019-12-16 NOTE — ED Notes (Signed)
Pt is currently asleep and appears comfortable; this RN will assess this pts psychiatric status afterwards.

## 2019-12-16 NOTE — ED Notes (Signed)
Patient didn't answer when called

## 2019-12-16 NOTE — ED Notes (Signed)
-  Patient accepted from [facility] -She says she has been cutting herself because "Lenell Antu" told her to do it. -PT says she met Hilda on a "BG Board," She sates its in her head and tells her to "do stuff." -she denies [place] telling her to hurt others; only to herself.

## 2019-12-16 NOTE — ED Provider Notes (Signed)
[facility] EMERGENCY DEPARTMENT Provider Note   CSN: [id number] Arrival date & time: 12/15/19  2156     History Chief Complaint  Patient presents with  . Suicidal    Brittany Palmer is a 24 y.o. female.  HPI   24 year old female presenting for psychiatric evaluation.  She has underlying history of autism spectrum disorder, major depressive disorder and bipolar.  Yesterday she began hearing "Lenell Antu" telling her to harm herself.  She wrote on her left arm and scratched her left wrist with some fingernail clippers because of this.  She met "Lenell Antu" from a [employer] and she sees or hears her from time to time.  Denies any thoughts or command hallucinations to harm anybody else.  She states that she refused her evening medications yesterday but is otherwise been pretty compliant.  When I initially evaluated her she was actually actively vomiting.  She told me that she felt like "I am burning up."  When speaking with her further she endorsed a mild sore throat.  No diarrhea.  Some burning when she urinated.  Denies any significant abdominal pain.  No respiratory symptoms.  Past Medical History:  Diagnosis Date  . Autism   . GERD (gastroesophageal reflux disease)   . Hypertension     Patient Active Problem List   Diagnosis Date Noted  . Bipolar I disorder, current or most recent episode depressed, with psychotic features ([place]) 05/19/2018  . HTN (hypertension) 05/19/2018  . GERD (gastroesophageal reflux disease) 05/19/2018  . Asthma 05/19/2018  . MDD (major depressive disorder), severe ([place]) 09/09/2017  . Autism spectrum disorder 05/17/2017  . MDD (major depressive disorder), recurrent severe, without psychosis ([place]) 05/16/2017    Past Surgical History:  Procedure Laterality Date  . TONSILLECTOMY       OB History   No obstetric history on file.     No family history on file.  Social History   Tobacco Use  . Smoking status: Never Smoker  . Smokeless  tobacco: Never Used  Substance Use Topics  . Alcohol use: No  . Drug use: No    Home Medications Prior to Admission medications   Medication Sig Start Date End Date Taking? Authorizing Provider  albuterol (PROVENTIL HFA;VENTOLIN HFA) 108 (90 Base) MCG/ACT inhaler Inhale 2 puffs into the lungs every 6 (six) hours as needed for wheezing or shortness of breath.     [provider]  ARIPiprazole ER (ABILIFY MAINTENA) 400 MG SRER injection Inject 2 mLs (400 mg total) into the muscle every 28 (twenty-eight) days. Next injection in October 2019 06/02/18   Pucilowska, Jolanta B, MD  fluticasone (FLONASE) 50 MCG/ACT nasal spray Place 1 spray into both nostrils daily.    [provider]  hydrOXYzine (ATARAX/VISTARIL) 50 MG tablet Take 1 tablet (50 mg total) by mouth 3 (three) times daily as needed for anxiety. 05/25/18   Pucilowska, Jolanta B, MD  mometasone-formoterol (DULERA) 100-5 MCG/ACT AERO Inhale 2 puffs into the lungs 2 (two) times daily. 05/25/18   Pucilowska, Jolanta B, MD  pantoprazole (PROTONIX) 40 MG tablet Take 1 tablet (40 mg total) by mouth daily. 05/24/17   Derrill Center, NP  QUEtiapine (SEROQUEL) 300 MG tablet Take 1 tablet (300 mg total) by mouth at bedtime. 05/25/18   Pucilowska, Herma Ard B, MD  verapamil (CALAN-SR) 240 MG CR tablet Take 240 mg by mouth at bedtime.    [provider]    Allergies    Lisinopril, Prozac [fluoxetine], and Risperdal [risperidone]  Review of Systems   Review of Systems All systems reviewed and negative, other than as noted in HPI.  Physical Exam Updated Vital Signs There were no vitals taken for this visit.  Physical Exam Vitals and nursing note reviewed.  Constitutional:      General: She is not in acute distress.    Appearance: She is well-developed.  HENT:     Head: Normocephalic and atraumatic.  Eyes:     General:        Right eye: No discharge.        Left eye: No discharge.     Conjunctiva/sclera:  Conjunctivae normal.  Cardiovascular:     Rate and Rhythm: Normal rate and regular rhythm.     Heart sounds: Normal heart sounds. No murmur. No friction rub. No gallop.   Pulmonary:     Effort: Pulmonary effort is normal. No respiratory distress.     Breath sounds: Normal breath sounds.  Abdominal:     General: There is no distension.     Palpations: Abdomen is soft.     Tenderness: There is no abdominal tenderness.  Musculoskeletal:        General: No tenderness.     Cervical back: Neck supple.  Skin:    General: Skin is warm and dry.     Comments: "Kill" written on her left forearm in marker.  Superficial "X" scratched onto her wrist.  Neurological:     Mental Status: She is alert.  Psychiatric:        Behavior: Behavior normal.        Thought Content: Thought content normal.     ED Results / Procedures / Treatments   Labs (all labs ordered are listed, but only abnormal results are displayed) Labs Reviewed  SALICYLATE LEVEL - Abnormal; Notable for the following components:      Result Value   Salicylate Lvl <3.7 (*)    All other components within normal limits  ACETAMINOPHEN LEVEL - Abnormal; Notable for the following components:   Acetaminophen (Tylenol), Serum <10 (*)    All other components within normal limits  CBC - Abnormal; Notable for the following components:   WBC 12.3 (*)    All other components within normal limits  URINALYSIS, ROUTINE W REFLEX MICROSCOPIC - Abnormal; Notable for the following components:   APPearance CLOUDY (*)    pH 9.0 (*)    Hgb urine dipstick MODERATE (*)    Protein, ur 100 (*)    Bacteria, UA RARE (*)    All other components within normal limits  RESPIRATORY PANEL BY RT PCR (FLU A&B, COVID)  URINE CULTURE  COMPREHENSIVE METABOLIC PANEL  ETHANOL  RAPID URINE DRUG SCREEN, HOSP PERFORMED  I-STAT BETA HCG BLOOD, ED ([facility], [facility], [facility] ONLY)    EKG None  Radiology No results found.  Procedures Procedures (including critical care  time)  Medications Ordered in ED Medications - No data to display  ED Course  I have reviewed the triage vital signs and the nursing notes.  Pertinent labs & imaging results that were available during my care of the patient were reviewed by me and considered in my medical decision making (see chart for details).    MDM Rules/Calculators/A&P                      24 year old female with underlying psychiatric history presenting with hallucinations and minor self-inflicted injury.  She is calm and cooperative with me.  Of note, when  I evaluated her she was complaining of feeling hot and was vomiting.  Her exam is otherwise pretty reassuring though.  Lungs clear.  Belly benign.  Complain of a mild sore throat but oropharynx looks clear to me.  Mild leukocytosis otherwise labs ok. Will check another temperature.  We will additionally check a urinalysis and Covid testing.  Once medically clear, will obtain TTS consultation.  She was offered Zofran or other medications to help with her nausea but she politely declined.  She was encouraged to ask for something if she felt like she needed it.  Medically cleared. Drinking w/o vomiting. Equivocal UTI. With symptoms though, will treat. 5d of keflex.   Final Clinical Impression(s) / ED Diagnoses Final diagnoses:  Hallucinations  Self-inflicted injury  Urinary tract infection without hematuria, site unspecified    Rx / DC Orders ED Discharge Orders    None       Virgel Manifold, MD 12/16/19 1414

## 2019-12-16 NOTE — ED Notes (Addendum)
Pt is now medically cleared per EDP

## 2019-12-16 NOTE — Progress Notes (Signed)
Pt accepted to [facility]    Dr. Estill Cotta is the accepting/attending provider.    Call report to [phone] or [phone]  [place] [facility] Peds ED notified.     Pt is voluntary and will be transported by [employer]  Pt is scheduled to arrive at [facility] on 12/17/19 after 8am.   Wells Guiles, LCSW, LCAS Disposition CSW [facility] BHH/TTS [phone]

## 2019-12-16 NOTE — ED Notes (Signed)
This RN called Pharmacy to send missing dose of medication

## 2019-12-17 DIAGNOSIS — Z79899 Other long term (current) drug therapy: Secondary | ICD-10-CM | POA: Diagnosis not present

## 2019-12-17 DIAGNOSIS — I10 Essential (primary) hypertension: Secondary | ICD-10-CM | POA: Diagnosis not present

## 2019-12-17 DIAGNOSIS — N390 Urinary tract infection, site not specified: Secondary | ICD-10-CM | POA: Diagnosis present

## 2019-12-17 DIAGNOSIS — F319 Bipolar disorder, unspecified: Secondary | ICD-10-CM | POA: Diagnosis present

## 2019-12-17 DIAGNOSIS — F840 Autistic disorder: Secondary | ICD-10-CM | POA: Diagnosis not present

## 2019-12-17 DIAGNOSIS — J45909 Unspecified asthma, uncomplicated: Secondary | ICD-10-CM | POA: Diagnosis present

## 2019-12-17 DIAGNOSIS — R440 Auditory hallucinations: Secondary | ICD-10-CM | POA: Diagnosis not present

## 2019-12-17 DIAGNOSIS — R45851 Suicidal ideations: Secondary | ICD-10-CM | POA: Diagnosis present

## 2019-12-17 DIAGNOSIS — Z20822 Contact with and (suspected) exposure to covid-19: Secondary | ICD-10-CM | POA: Diagnosis not present

## 2019-12-17 DIAGNOSIS — Z915 Personal history of self-harm: Secondary | ICD-10-CM | POA: Diagnosis not present

## 2019-12-17 DIAGNOSIS — J4540 Moderate persistent asthma, uncomplicated: Secondary | ICD-10-CM | POA: Diagnosis not present

## 2019-12-17 DIAGNOSIS — K219 Gastro-esophageal reflux disease without esophagitis: Secondary | ICD-10-CM | POA: Diagnosis present

## 2019-12-17 DIAGNOSIS — F315 Bipolar disorder, current episode depressed, severe, with psychotic features: Secondary | ICD-10-CM | POA: Diagnosis not present

## 2019-12-17 LAB — URINE CULTURE

## 2019-12-17 NOTE — ED Notes (Signed)
Called [facility] to give report. Stated to call back after 0900.

## 2019-12-17 NOTE — ED Notes (Signed)
Pt is resting comfortably at this time.

## 2019-12-17 NOTE — ED Provider Notes (Signed)
Emergency Medicine Observation Re-evaluation Note  Brittany Palmer is a 24 y.o. female, seen on rounds today.  Pt initially presented to the ED for complaints of Suicidal Currently, the patient is preparing for transfer, she has showered and is now going to brush her teeth.  She is cooperative and interactive with staff, appropriately.   The patient has been placed in psychiatric observation due to the need to provide a safe environment for the patient while obtaining psychiatric consultation and evaluation, as well as ongoing medical and medication management to treat the patient's condition.  The patient has not been placed under full IVC at this time.   Physical Exam  BP (!) 133/92 (BP Location: Left Arm)   Pulse 92   Temp 98.3 F (36.8 C) (Oral)   Resp 16   SpO2 99%  Physical Exam  ED Course / MDM  EKG:    I have reviewed the labs performed to date as well as medications administered while in observation.  Recent changes in the last 24 hours include she remains cooperative and stable. Plan  Current plan is for transfer to a psychiatric facility. Patient is not under full IVC at this time.   Mancel Bale, MD 12/17/19 1017

## 2019-12-17 NOTE — ED Notes (Signed)
Placement 12/17/19  bfast ordered

## 2019-12-17 NOTE — ED Notes (Signed)
Spoke with Electrical engineer at [facility] for report.

## 2019-12-17 NOTE — ED Notes (Signed)
Called [facility] with the two numbers provided no answer. Will call back.

## 2019-12-17 NOTE — ED Notes (Signed)
Safe transport arrive. Nurse gave patient belongings one bag  And paper work to [employer].

## 2020-01-05 DIAGNOSIS — Z6827 Body mass index (BMI) 27.0-27.9, adult: Secondary | ICD-10-CM | POA: Diagnosis not present

## 2020-01-05 DIAGNOSIS — G4700 Insomnia, unspecified: Secondary | ICD-10-CM | POA: Diagnosis not present

## 2020-01-05 DIAGNOSIS — F840 Autistic disorder: Secondary | ICD-10-CM | POA: Diagnosis not present

## 2020-01-05 DIAGNOSIS — J45909 Unspecified asthma, uncomplicated: Secondary | ICD-10-CM | POA: Diagnosis not present

## 2020-01-11 DIAGNOSIS — N390 Urinary tract infection, site not specified: Secondary | ICD-10-CM | POA: Diagnosis not present

## 2020-01-19 DIAGNOSIS — F840 Autistic disorder: Secondary | ICD-10-CM | POA: Diagnosis not present

## 2020-01-19 DIAGNOSIS — G4700 Insomnia, unspecified: Secondary | ICD-10-CM | POA: Diagnosis not present

## 2020-01-19 DIAGNOSIS — J45909 Unspecified asthma, uncomplicated: Secondary | ICD-10-CM | POA: Diagnosis not present

## 2020-01-19 DIAGNOSIS — Z6825 Body mass index (BMI) 25.0-25.9, adult: Secondary | ICD-10-CM | POA: Diagnosis not present

## 2020-01-27 DIAGNOSIS — Z01419 Encounter for gynecological examination (general) (routine) without abnormal findings: Secondary | ICD-10-CM | POA: Diagnosis not present

## 2020-01-27 DIAGNOSIS — B373 Candidiasis of vulva and vagina: Secondary | ICD-10-CM | POA: Diagnosis not present

## 2020-01-27 DIAGNOSIS — R350 Frequency of micturition: Secondary | ICD-10-CM | POA: Diagnosis not present

## 2020-01-27 DIAGNOSIS — Z01411 Encounter for gynecological examination (general) (routine) with abnormal findings: Secondary | ICD-10-CM | POA: Diagnosis not present

## 2020-01-27 DIAGNOSIS — N898 Other specified noninflammatory disorders of vagina: Secondary | ICD-10-CM | POA: Diagnosis not present

## 2020-02-22 DIAGNOSIS — R000 Tachycardia, unspecified: Secondary | ICD-10-CM | POA: Diagnosis not present

## 2020-03-15 DIAGNOSIS — Z79899 Other long term (current) drug therapy: Secondary | ICD-10-CM | POA: Diagnosis not present

## 2020-03-15 DIAGNOSIS — F209 Schizophrenia, unspecified: Secondary | ICD-10-CM | POA: Diagnosis not present

## 2020-03-15 DIAGNOSIS — G259 Extrapyramidal and movement disorder, unspecified: Secondary | ICD-10-CM | POA: Diagnosis not present

## 2020-03-15 DIAGNOSIS — F79 Unspecified intellectual disabilities: Secondary | ICD-10-CM | POA: Diagnosis not present

## 2020-03-15 DIAGNOSIS — Z6825 Body mass index (BMI) 25.0-25.9, adult: Secondary | ICD-10-CM | POA: Diagnosis not present

## 2020-03-15 DIAGNOSIS — F329 Major depressive disorder, single episode, unspecified: Secondary | ICD-10-CM | POA: Diagnosis not present

## 2020-03-17 DIAGNOSIS — E559 Vitamin D deficiency, unspecified: Secondary | ICD-10-CM | POA: Diagnosis not present

## 2020-03-22 DIAGNOSIS — F840 Autistic disorder: Secondary | ICD-10-CM | POA: Diagnosis not present

## 2020-03-22 DIAGNOSIS — I10 Essential (primary) hypertension: Secondary | ICD-10-CM | POA: Diagnosis not present

## 2020-03-22 DIAGNOSIS — G4700 Insomnia, unspecified: Secondary | ICD-10-CM | POA: Diagnosis not present

## 2020-03-24 DIAGNOSIS — F319 Bipolar disorder, unspecified: Secondary | ICD-10-CM | POA: Diagnosis not present

## 2020-03-24 DIAGNOSIS — F840 Autistic disorder: Secondary | ICD-10-CM | POA: Diagnosis not present

## 2020-03-24 DIAGNOSIS — I10 Essential (primary) hypertension: Secondary | ICD-10-CM | POA: Diagnosis not present

## 2020-04-10 DIAGNOSIS — G4700 Insomnia, unspecified: Secondary | ICD-10-CM | POA: Diagnosis not present

## 2020-04-10 DIAGNOSIS — Z6824 Body mass index (BMI) 24.0-24.9, adult: Secondary | ICD-10-CM | POA: Diagnosis not present

## 2020-04-10 DIAGNOSIS — F840 Autistic disorder: Secondary | ICD-10-CM | POA: Diagnosis not present

## 2020-04-10 DIAGNOSIS — I10 Essential (primary) hypertension: Secondary | ICD-10-CM | POA: Diagnosis not present

## 2020-06-02 DIAGNOSIS — Z20822 Contact with and (suspected) exposure to covid-19: Secondary | ICD-10-CM | POA: Diagnosis not present

## 2020-06-07 DIAGNOSIS — F840 Autistic disorder: Secondary | ICD-10-CM | POA: Diagnosis not present

## 2020-06-07 DIAGNOSIS — R059 Cough, unspecified: Secondary | ICD-10-CM | POA: Diagnosis not present

## 2020-06-07 DIAGNOSIS — E559 Vitamin D deficiency, unspecified: Secondary | ICD-10-CM | POA: Diagnosis not present

## 2020-06-07 DIAGNOSIS — J45909 Unspecified asthma, uncomplicated: Secondary | ICD-10-CM | POA: Diagnosis not present

## 2020-06-20 DIAGNOSIS — E559 Vitamin D deficiency, unspecified: Secondary | ICD-10-CM | POA: Diagnosis not present

## 2020-06-20 DIAGNOSIS — Z6827 Body mass index (BMI) 27.0-27.9, adult: Secondary | ICD-10-CM | POA: Diagnosis not present

## 2020-06-20 DIAGNOSIS — I10 Essential (primary) hypertension: Secondary | ICD-10-CM | POA: Diagnosis not present

## 2020-06-20 DIAGNOSIS — G4700 Insomnia, unspecified: Secondary | ICD-10-CM | POA: Diagnosis not present

## 2020-06-20 DIAGNOSIS — F840 Autistic disorder: Secondary | ICD-10-CM | POA: Diagnosis not present

## 2020-06-21 DIAGNOSIS — G4700 Insomnia, unspecified: Secondary | ICD-10-CM | POA: Diagnosis not present

## 2020-06-21 DIAGNOSIS — I10 Essential (primary) hypertension: Secondary | ICD-10-CM | POA: Diagnosis not present

## 2020-06-21 DIAGNOSIS — R112 Nausea with vomiting, unspecified: Secondary | ICD-10-CM | POA: Diagnosis not present

## 2020-06-21 DIAGNOSIS — Z6827 Body mass index (BMI) 27.0-27.9, adult: Secondary | ICD-10-CM | POA: Diagnosis not present

## 2020-06-21 DIAGNOSIS — F840 Autistic disorder: Secondary | ICD-10-CM | POA: Diagnosis not present

## 2020-06-23 DIAGNOSIS — R630 Anorexia: Secondary | ICD-10-CM | POA: Diagnosis not present

## 2020-06-27 DIAGNOSIS — I10 Essential (primary) hypertension: Secondary | ICD-10-CM | POA: Diagnosis not present

## 2020-06-27 DIAGNOSIS — F840 Autistic disorder: Secondary | ICD-10-CM | POA: Diagnosis not present

## 2020-06-27 DIAGNOSIS — G4700 Insomnia, unspecified: Secondary | ICD-10-CM | POA: Diagnosis not present

## 2020-06-27 DIAGNOSIS — E559 Vitamin D deficiency, unspecified: Secondary | ICD-10-CM | POA: Diagnosis not present

## 2020-06-27 DIAGNOSIS — Z6827 Body mass index (BMI) 27.0-27.9, adult: Secondary | ICD-10-CM | POA: Diagnosis not present

## 2020-07-05 DIAGNOSIS — Z6827 Body mass index (BMI) 27.0-27.9, adult: Secondary | ICD-10-CM | POA: Diagnosis not present

## 2020-07-05 DIAGNOSIS — I10 Essential (primary) hypertension: Secondary | ICD-10-CM | POA: Diagnosis not present

## 2020-07-05 DIAGNOSIS — G4700 Insomnia, unspecified: Secondary | ICD-10-CM | POA: Diagnosis not present

## 2020-07-05 DIAGNOSIS — F840 Autistic disorder: Secondary | ICD-10-CM | POA: Diagnosis not present

## 2020-07-19 DIAGNOSIS — Z23 Encounter for immunization: Secondary | ICD-10-CM | POA: Diagnosis not present

## 2020-07-24 DIAGNOSIS — R0602 Shortness of breath: Secondary | ICD-10-CM | POA: Diagnosis not present

## 2020-07-24 DIAGNOSIS — R059 Cough, unspecified: Secondary | ICD-10-CM | POA: Diagnosis not present

## 2020-08-02 DIAGNOSIS — Z6828 Body mass index (BMI) 28.0-28.9, adult: Secondary | ICD-10-CM | POA: Diagnosis not present

## 2020-08-02 DIAGNOSIS — G4700 Insomnia, unspecified: Secondary | ICD-10-CM | POA: Diagnosis not present

## 2020-08-02 DIAGNOSIS — I10 Essential (primary) hypertension: Secondary | ICD-10-CM | POA: Diagnosis not present

## 2020-08-02 DIAGNOSIS — F840 Autistic disorder: Secondary | ICD-10-CM | POA: Diagnosis not present

## 2020-09-29 DIAGNOSIS — G4700 Insomnia, unspecified: Secondary | ICD-10-CM | POA: Diagnosis not present

## 2020-09-29 DIAGNOSIS — I10 Essential (primary) hypertension: Secondary | ICD-10-CM | POA: Diagnosis not present

## 2020-09-29 DIAGNOSIS — J45909 Unspecified asthma, uncomplicated: Secondary | ICD-10-CM | POA: Diagnosis not present

## 2020-09-29 DIAGNOSIS — F32A Depression, unspecified: Secondary | ICD-10-CM | POA: Diagnosis not present

## 2020-09-29 DIAGNOSIS — Z6829 Body mass index (BMI) 29.0-29.9, adult: Secondary | ICD-10-CM | POA: Diagnosis not present

## 2020-09-29 DIAGNOSIS — F840 Autistic disorder: Secondary | ICD-10-CM | POA: Diagnosis not present

## 2020-09-29 DIAGNOSIS — F319 Bipolar disorder, unspecified: Secondary | ICD-10-CM | POA: Diagnosis not present

## 2020-10-11 DIAGNOSIS — R051 Acute cough: Secondary | ICD-10-CM | POA: Diagnosis not present

## 2020-10-11 DIAGNOSIS — R062 Wheezing: Secondary | ICD-10-CM | POA: Diagnosis not present

## 2020-10-11 DIAGNOSIS — J45909 Unspecified asthma, uncomplicated: Secondary | ICD-10-CM | POA: Diagnosis not present

## 2020-10-11 DIAGNOSIS — G259 Extrapyramidal and movement disorder, unspecified: Secondary | ICD-10-CM | POA: Diagnosis not present

## 2020-10-11 DIAGNOSIS — Z79899 Other long term (current) drug therapy: Secondary | ICD-10-CM | POA: Diagnosis not present

## 2020-10-11 DIAGNOSIS — Z6828 Body mass index (BMI) 28.0-28.9, adult: Secondary | ICD-10-CM | POA: Diagnosis not present

## 2020-10-11 DIAGNOSIS — F79 Unspecified intellectual disabilities: Secondary | ICD-10-CM | POA: Diagnosis not present

## 2020-12-20 DIAGNOSIS — I10 Essential (primary) hypertension: Secondary | ICD-10-CM | POA: Diagnosis not present

## 2020-12-20 DIAGNOSIS — F32A Depression, unspecified: Secondary | ICD-10-CM | POA: Diagnosis not present

## 2020-12-20 DIAGNOSIS — Z5181 Encounter for therapeutic drug level monitoring: Secondary | ICD-10-CM | POA: Diagnosis not present

## 2020-12-20 DIAGNOSIS — F319 Bipolar disorder, unspecified: Secondary | ICD-10-CM | POA: Diagnosis not present

## 2020-12-20 DIAGNOSIS — J45909 Unspecified asthma, uncomplicated: Secondary | ICD-10-CM | POA: Diagnosis not present

## 2020-12-20 DIAGNOSIS — G4700 Insomnia, unspecified: Secondary | ICD-10-CM | POA: Diagnosis not present

## 2020-12-20 DIAGNOSIS — Z6827 Body mass index (BMI) 27.0-27.9, adult: Secondary | ICD-10-CM | POA: Diagnosis not present

## 2020-12-20 DIAGNOSIS — F840 Autistic disorder: Secondary | ICD-10-CM | POA: Diagnosis not present

## 2020-12-20 DIAGNOSIS — Z79899 Other long term (current) drug therapy: Secondary | ICD-10-CM | POA: Diagnosis not present

## 2021-02-08 DIAGNOSIS — U071 COVID-19: Secondary | ICD-10-CM | POA: Diagnosis not present

## 2021-02-08 DIAGNOSIS — I10 Essential (primary) hypertension: Secondary | ICD-10-CM | POA: Diagnosis not present

## 2021-02-08 DIAGNOSIS — Z6827 Body mass index (BMI) 27.0-27.9, adult: Secondary | ICD-10-CM | POA: Diagnosis not present

## 2021-02-08 DIAGNOSIS — F79 Unspecified intellectual disabilities: Secondary | ICD-10-CM | POA: Diagnosis not present

## 2021-03-19 DIAGNOSIS — R000 Tachycardia, unspecified: Secondary | ICD-10-CM | POA: Diagnosis not present

## 2021-03-21 DIAGNOSIS — I10 Essential (primary) hypertension: Secondary | ICD-10-CM | POA: Diagnosis not present

## 2021-03-21 DIAGNOSIS — Z6827 Body mass index (BMI) 27.0-27.9, adult: Secondary | ICD-10-CM | POA: Diagnosis not present

## 2021-03-21 DIAGNOSIS — G259 Extrapyramidal and movement disorder, unspecified: Secondary | ICD-10-CM | POA: Diagnosis not present

## 2021-03-21 DIAGNOSIS — Z79899 Other long term (current) drug therapy: Secondary | ICD-10-CM | POA: Diagnosis not present

## 2021-03-21 DIAGNOSIS — F32A Depression, unspecified: Secondary | ICD-10-CM | POA: Diagnosis not present

## 2021-03-21 DIAGNOSIS — G4700 Insomnia, unspecified: Secondary | ICD-10-CM | POA: Diagnosis not present

## 2021-03-21 DIAGNOSIS — F840 Autistic disorder: Secondary | ICD-10-CM | POA: Diagnosis not present

## 2021-03-21 DIAGNOSIS — F319 Bipolar disorder, unspecified: Secondary | ICD-10-CM | POA: Diagnosis not present

## 2021-03-21 DIAGNOSIS — J45909 Unspecified asthma, uncomplicated: Secondary | ICD-10-CM | POA: Diagnosis not present

## 2021-03-21 DIAGNOSIS — Z5181 Encounter for therapeutic drug level monitoring: Secondary | ICD-10-CM | POA: Diagnosis not present

## 2021-04-13 DIAGNOSIS — J45909 Unspecified asthma, uncomplicated: Secondary | ICD-10-CM | POA: Diagnosis not present

## 2021-04-13 DIAGNOSIS — F32A Depression, unspecified: Secondary | ICD-10-CM | POA: Diagnosis not present

## 2021-04-13 DIAGNOSIS — G4700 Insomnia, unspecified: Secondary | ICD-10-CM | POA: Diagnosis not present

## 2021-04-13 DIAGNOSIS — Z6827 Body mass index (BMI) 27.0-27.9, adult: Secondary | ICD-10-CM | POA: Diagnosis not present

## 2021-04-13 DIAGNOSIS — Z5181 Encounter for therapeutic drug level monitoring: Secondary | ICD-10-CM | POA: Diagnosis not present

## 2021-04-13 DIAGNOSIS — Z79899 Other long term (current) drug therapy: Secondary | ICD-10-CM | POA: Diagnosis not present

## 2021-05-23 DIAGNOSIS — Z5181 Encounter for therapeutic drug level monitoring: Secondary | ICD-10-CM | POA: Diagnosis not present

## 2021-05-23 DIAGNOSIS — G4700 Insomnia, unspecified: Secondary | ICD-10-CM | POA: Diagnosis not present

## 2021-05-23 DIAGNOSIS — R200 Anesthesia of skin: Secondary | ICD-10-CM | POA: Diagnosis not present

## 2021-05-23 DIAGNOSIS — Z6824 Body mass index (BMI) 24.0-24.9, adult: Secondary | ICD-10-CM | POA: Diagnosis not present

## 2021-05-23 DIAGNOSIS — Z79899 Other long term (current) drug therapy: Secondary | ICD-10-CM | POA: Diagnosis not present

## 2021-05-24 DIAGNOSIS — E7219 Other disorders of sulfur-bearing amino-acid metabolism: Secondary | ICD-10-CM | POA: Diagnosis not present

## 2021-05-24 DIAGNOSIS — D519 Vitamin B12 deficiency anemia, unspecified: Secondary | ICD-10-CM | POA: Diagnosis not present

## 2021-05-24 DIAGNOSIS — R7982 Elevated C-reactive protein (CRP): Secondary | ICD-10-CM | POA: Diagnosis not present

## 2021-05-24 DIAGNOSIS — Z789 Other specified health status: Secondary | ICD-10-CM | POA: Diagnosis not present

## 2021-06-06 DIAGNOSIS — J45909 Unspecified asthma, uncomplicated: Secondary | ICD-10-CM | POA: Diagnosis not present

## 2021-06-06 DIAGNOSIS — Z79899 Other long term (current) drug therapy: Secondary | ICD-10-CM | POA: Diagnosis not present

## 2021-06-06 DIAGNOSIS — F32A Depression, unspecified: Secondary | ICD-10-CM | POA: Diagnosis not present

## 2021-06-06 DIAGNOSIS — Z5181 Encounter for therapeutic drug level monitoring: Secondary | ICD-10-CM | POA: Diagnosis not present

## 2021-06-06 DIAGNOSIS — G4700 Insomnia, unspecified: Secondary | ICD-10-CM | POA: Diagnosis not present

## 2021-06-06 DIAGNOSIS — Z6824 Body mass index (BMI) 24.0-24.9, adult: Secondary | ICD-10-CM | POA: Diagnosis not present

## 2021-07-04 DIAGNOSIS — E559 Vitamin D deficiency, unspecified: Secondary | ICD-10-CM | POA: Diagnosis not present

## 2021-07-04 DIAGNOSIS — R946 Abnormal results of thyroid function studies: Secondary | ICD-10-CM | POA: Diagnosis not present

## 2021-07-04 DIAGNOSIS — R6889 Other general symptoms and signs: Secondary | ICD-10-CM | POA: Diagnosis not present

## 2021-07-04 DIAGNOSIS — R7303 Prediabetes: Secondary | ICD-10-CM | POA: Diagnosis not present

## 2021-07-04 DIAGNOSIS — Z5181 Encounter for therapeutic drug level monitoring: Secondary | ICD-10-CM | POA: Diagnosis not present

## 2021-07-04 DIAGNOSIS — E785 Hyperlipidemia, unspecified: Secondary | ICD-10-CM | POA: Diagnosis not present

## 2021-08-07 DIAGNOSIS — Z23 Encounter for immunization: Secondary | ICD-10-CM | POA: Diagnosis not present

## 2021-08-07 DIAGNOSIS — Z6824 Body mass index (BMI) 24.0-24.9, adult: Secondary | ICD-10-CM | POA: Diagnosis not present

## 2021-08-07 DIAGNOSIS — I10 Essential (primary) hypertension: Secondary | ICD-10-CM | POA: Diagnosis not present

## 2021-08-07 DIAGNOSIS — F79 Unspecified intellectual disabilities: Secondary | ICD-10-CM | POA: Diagnosis not present

## 2021-08-07 DIAGNOSIS — Z7185 Encounter for immunization safety counseling: Secondary | ICD-10-CM | POA: Diagnosis not present

## 2021-08-14 DIAGNOSIS — F840 Autistic disorder: Secondary | ICD-10-CM | POA: Diagnosis not present

## 2021-08-14 DIAGNOSIS — J45909 Unspecified asthma, uncomplicated: Secondary | ICD-10-CM | POA: Diagnosis not present

## 2021-08-14 DIAGNOSIS — G4700 Insomnia, unspecified: Secondary | ICD-10-CM | POA: Diagnosis not present

## 2021-08-14 DIAGNOSIS — Z0000 Encounter for general adult medical examination without abnormal findings: Secondary | ICD-10-CM | POA: Diagnosis not present

## 2021-08-14 DIAGNOSIS — I10 Essential (primary) hypertension: Secondary | ICD-10-CM | POA: Diagnosis not present

## 2021-08-14 DIAGNOSIS — F79 Unspecified intellectual disabilities: Secondary | ICD-10-CM | POA: Diagnosis not present

## 2021-08-14 DIAGNOSIS — Z6824 Body mass index (BMI) 24.0-24.9, adult: Secondary | ICD-10-CM | POA: Diagnosis not present

## 2021-08-16 DIAGNOSIS — Z23 Encounter for immunization: Secondary | ICD-10-CM | POA: Diagnosis not present

## 2021-12-27 ENCOUNTER — Other Ambulatory Visit: Payer: Self-pay

## 2021-12-27 ENCOUNTER — Emergency Department (HOSPITAL_COMMUNITY)
Admission: EM | Admit: 2021-12-27 | Discharge: 2021-12-28 | Disposition: A | Discharge status: Home / Self Care | Payer: Medicare Other | Attending: Emergency Medicine | Admitting: Emergency Medicine

## 2021-12-27 ENCOUNTER — Encounter (HOSPITAL_COMMUNITY): Payer: Self-pay | Admitting: Emergency Medicine

## 2021-12-27 ENCOUNTER — Emergency Department (HOSPITAL_COMMUNITY): Payer: Medicare Other

## 2021-12-27 DIAGNOSIS — R0789 Other chest pain: Secondary | ICD-10-CM | POA: Diagnosis not present

## 2021-12-27 DIAGNOSIS — Z20822 Contact with and (suspected) exposure to covid-19: Secondary | ICD-10-CM | POA: Insufficient documentation

## 2021-12-27 DIAGNOSIS — R509 Fever, unspecified: Secondary | ICD-10-CM

## 2021-12-27 DIAGNOSIS — F840 Autistic disorder: Secondary | ICD-10-CM | POA: Diagnosis not present

## 2021-12-27 DIAGNOSIS — Z79899 Other long term (current) drug therapy: Secondary | ICD-10-CM | POA: Insufficient documentation

## 2021-12-27 DIAGNOSIS — F509 Eating disorder, unspecified: Secondary | ICD-10-CM | POA: Insufficient documentation

## 2021-12-27 DIAGNOSIS — R45851 Suicidal ideations: Secondary | ICD-10-CM | POA: Insufficient documentation

## 2021-12-27 DIAGNOSIS — F419 Anxiety disorder, unspecified: Secondary | ICD-10-CM | POA: Insufficient documentation

## 2021-12-27 DIAGNOSIS — R443 Hallucinations, unspecified: Secondary | ICD-10-CM

## 2021-12-27 DIAGNOSIS — F329 Major depressive disorder, single episode, unspecified: Secondary | ICD-10-CM | POA: Insufficient documentation

## 2021-12-27 DIAGNOSIS — F250 Schizoaffective disorder, bipolar type: Secondary | ICD-10-CM | POA: Diagnosis not present

## 2021-12-27 LAB — ETHANOL: Alcohol, Ethyl (B): <10 mg/dL (ref <10)

## 2021-12-27 LAB — RAPID URINE DRUG SCREEN, HOSP PERFORMED
Amphetamines: NOT DETECTED
Barbiturates: NOT DETECTED
Benzodiazepines: NOT DETECTED
Cocaine: NOT DETECTED
Opiates: NOT DETECTED
Tetrahydrocannabinol: NOT DETECTED

## 2021-12-27 LAB — RESP PANEL BY RT-PCR (FLU A&B, COVID) ARPGX2
Influenza A by PCR: NEGATIVE
Influenza B by PCR: NEGATIVE
SARS Coronavirus 2 by RT PCR: NEGATIVE

## 2021-12-27 LAB — TROPONIN I (HIGH SENSITIVITY)
Troponin I (High Sensitivity): 3 ng/L (ref <18)
Troponin I (High Sensitivity): 3 ng/L (ref <18)

## 2021-12-27 LAB — SALICYLATE LEVEL: Salicylate Lvl: <7 mg/dL — ABNORMAL LOW (ref 7.0–30.0)

## 2021-12-27 LAB — I-STAT BETA HCG BLOOD, ED (MC, WL, AP ONLY): I-stat hCG, quantitative: <5 m[IU]/mL (ref <5)

## 2021-12-27 LAB — ACETAMINOPHEN LEVEL: Acetaminophen (Tylenol), Serum: <10 ug/mL — ABNORMAL LOW (ref 10–30)

## 2021-12-27 MED ORDER — ACETAMINOPHEN 325 MG PO TABS
650.0000 mg | ORAL_TABLET | ORAL | Status: DC | PRN
  Administered 2021-12-27 – 2021-12-28 (×2): 650 mg via ORAL
  Filled 2021-12-27 (×2): qty 2

## 2021-12-27 MED ORDER — LAMOTRIGINE 25 MG PO TABS
25.0000 mg | ORAL_TABLET | Freq: Every day | ORAL | Status: DC
  Administered 2021-12-27 – 2021-12-28 (×2): 25 mg via ORAL
  Filled 2021-12-27 (×3): qty 1

## 2021-12-27 MED ORDER — QUETIAPINE FUMARATE 200 MG PO TABS
400.0000 mg | ORAL_TABLET | Freq: Every day | ORAL | Status: DC
  Administered 2021-12-27: 400 mg via ORAL
  Filled 2021-12-27 (×2): qty 2

## 2021-12-27 MED ORDER — LORAZEPAM 1 MG PO TABS
1.0000 mg | ORAL_TABLET | Freq: Once | ORAL | Status: AC
  Administered 2021-12-27: 1 mg via ORAL
  Filled 2021-12-27: qty 1

## 2021-12-27 MED ORDER — ONDANSETRON HCL 4 MG PO TABS
4.0000 mg | ORAL_TABLET | Freq: Three times a day (TID) | ORAL | Status: DC | PRN
  Administered 2021-12-27: 4 mg via ORAL
  Filled 2021-12-27: qty 1

## 2021-12-27 MED ORDER — ONDANSETRON 4 MG PO TBDP
4.0000 mg | ORAL_TABLET | Freq: Once | ORAL | Status: AC
  Administered 2021-12-27: 4 mg via ORAL
  Filled 2021-12-27: qty 1

## 2021-12-27 MED ORDER — ARIPIPRAZOLE 10 MG PO TABS
15.0000 mg | ORAL_TABLET | Freq: Every day | ORAL | Status: DC
  Administered 2021-12-27 – 2021-12-28 (×2): 15 mg via ORAL
  Filled 2021-12-27 (×2): qty 2

## 2021-12-27 MED ORDER — QUETIAPINE FUMARATE 400 MG PO TABS
400.0000 mg | ORAL_TABLET | Freq: Every day | ORAL | Status: DC
  Filled 2021-12-27: qty 1

## 2021-12-27 MED ORDER — VERAPAMIL HCL 40 MG PO TABS
40.0000 mg | ORAL_TABLET | Freq: Every day | ORAL | Status: DC
  Administered 2021-12-27 – 2021-12-28 (×2): 40 mg via ORAL
  Filled 2021-12-27 (×3): qty 1

## 2021-12-27 MED ORDER — ALBUTEROL SULFATE HFA 108 (90 BASE) MCG/ACT IN AERS
1.0000 | INHALATION_SPRAY | Freq: Four times a day (QID) | RESPIRATORY_TRACT | Status: DC | PRN
  Administered 2021-12-27 – 2021-12-28 (×2): 2 via RESPIRATORY_TRACT
  Filled 2021-12-27: qty 6.7

## 2021-12-27 MED ORDER — ZOLPIDEM TARTRATE 5 MG PO TABS
5.0000 mg | ORAL_TABLET | Freq: Every evening | ORAL | Status: DC | PRN
  Administered 2021-12-27: 5 mg via ORAL
  Filled 2021-12-27: qty 1

## 2021-12-27 MED ORDER — LORATADINE 10 MG PO TABS
10.0000 mg | ORAL_TABLET | Freq: Every day | ORAL | Status: DC
  Administered 2021-12-27 – 2021-12-28 (×2): 10 mg via ORAL
  Filled 2021-12-27 (×2): qty 1

## 2021-12-27 MED ORDER — BUDESONIDE 180 MCG/ACT IN AEPB
2.0000 | INHALATION_SPRAY | Freq: Two times a day (BID) | RESPIRATORY_TRACT | Status: DC
  Administered 2021-12-28: 2 via RESPIRATORY_TRACT
  Filled 2021-12-27: qty 1

## 2021-12-27 NOTE — ED Notes (Signed)
Belongings placed in locker number 14. Along with zebra print suitcase  ?

## 2021-12-27 NOTE — ED Provider Triage Note (Signed)
Emergency Medicine Provider Triage Evaluation Note ? ?Brittany Palmer , a 26 y.o. female  was evaluated in triage.  Pt complains of chest pain, bilateral hand tingling, anxiety, suicidal ideation with active plan.  Patient reports that she wants to hang herself.  She has a history of bipolar, major depression, schizophrenia.  She reports that she is hearing voices but she cannot make out what they are saying.  She reports that she has been taking her medications as prescribed.  She also endorses a history of autism.  She presents from group home with her care worker.  She reports the chest pain is central, like a pressure, severe, and she will start this morning. She also endorses some nausea. ? ?Review of Systems  ?Positive: Chest pain, SI, hand tingling ?Negative: Shob, headache ? ?Physical Exam  ?BP (!) 154/106   Pulse (!) 118   Temp (!) 100.5 ?F (38.1 ?C) (Oral)   Resp (!) 26   SpO2 97%  ?Gen:   Awake, no distress   ?Resp:  Normal effort  ?MSK:   Moves extremities without difficulty  ?Other:  No TTP chest wall ? ?Medical Decision Making  ?Medically screening exam initiated at 3:33 PM.  Appropriate orders placed.  SORELLE TOKARZ was informed that the remainder of the evaluation will be completed by another provider, this initial triage assessment does not replace that evaluation, and the importance of remaining in the ED until their evaluation is complete. ? ?IVC paperwork initiated, home meds ordered ?  ?Anselmo Pickler, PA-C ?12/27/21 1535 ? ?

## 2021-12-27 NOTE — BH Assessment (Addendum)
Comprehensive Clinical Assessment (CCA) Note  12/28/2021 Brittany Palmer [id number] Disposition: Clinician discussed patient care with Nicolette Bang.  He recommended observation of patient in ED and review by psychiatry on 04/28.  Clinician informed Dr. Jeraldine Loots and RN Fredricka Bonine via secure chat.    Pt has a flat affect during assessment.  She is oriented and makes good eye contact.  Pt does at times look over her shoulder and when asked about it she said she is hearing voices in the corner.  Pt admits to seeing things and feeling things.  She does not appear to display any delusional thought content.  Pt says she may sleep well one day then not at all the next.  Pt reports poor appetite over the last few days.    Pt has a therapist that comes to the day program she attends.     Chief Complaint:  Chief Complaint  Patient presents with   Suicidal   Chest Pain   Visit Diagnosis: Schizophrenia; Bipolar d/o; ASD   CCA Screening, Triage and Referral (STR)  Patient Reported Information How did you hear about [place]? Other (Comment) (Staff from [employer] brought her to [facility].)  What Is the Reason for Your Visit/Call Today? Pt lives in a group home named [facility].  She said staff brought her in because she was having nausea, chest pain and her arms were feeling "weird" feeling.  She is also feeling suicidal, "possibly hanging myself."  Pt says she also felt angry because she thought the staff was not taking her seriously.  Pt has had multiple suicide attempts and had once tried to overdose on benedryl.  Pt denies any HI.  Pt hears voices at times that are demonic.  She may hear voices call her name but largely she forgets what they say.  She may see crosses that "look like black twigs."  Pt may have tactile hallucinations of things touching her or "trying to get in the bed."  Sees ghosts with dark eyes.  Pt says her mother is her guardian and she is already aware that pt is at [facility].  Pt has lived at this  gh for 3 years.  [facility]  Pt reports her appetite has been off.  She has had some nausea and threw up when she tried ot eat at [facility].  Pt sleep is erratic.  She said her SI and anxiousness have been the worst today.  How Long Has This Been Causing You Problems? > than 6 months  What Do You Feel Would Help You the Most Today? No data recorded  Have You Recently Had Any Thoughts About Hurting Yourself? Yes  Are You Planning to Commit Suicide/Harm Yourself At This time? Yes   Have you Recently Had Thoughts About Hurting Someone [name]? No  Are You Planning to Harm Someone at This Time? No  Explanation: No data recorded  Have You Used Any Alcohol or Drugs in the Past 24 Hours? No  How Long Ago Did You Use Drugs or Alcohol? No data recorded What Did You Use and How Much? No data recorded  Do You Currently Have a Therapist/Psychiatrist? Yes  Name of Therapist/Psychiatrist: Pt is unsure but says they come ot the day program.   Have You Been Recently Discharged From Any Office Practice or Programs? No  Explanation of Discharge From Practice/Program: No data recorded    CCA Screening Triage Referral Assessment Type of Contact: Tele-Assessment  Telemedicine Service  Delivery:   Is this Initial or Reassessment? Initial Assessment  Date Telepsych consult ordered in [facility]:  12/27/21  Time Telepsych consult ordered in [facility]:  1529  Location of Assessment: [facility] ED  Provider Location: [facility] Assessment Services   Collateral Involvement: No data recorded  Does Patient Have a Court Appointed Legal Guardian? No data recorded Name and Contact of Legal Guardian: No data recorded If Minor and Not Living with Parent(s), Who has Custody? No data recorded Is CPS involved or ever been involved? In the Past  Is APS involved or ever been involved? Never   Patient Determined To Be At Risk for Harm To Self or Others Based on Review of Patient Reported  Information or Presenting Complaint? Yes, for Self-Harm  Method: No data recorded Availability of Means: No data recorded Intent: No data recorded Notification Required: No data recorded Additional Information for Danger to Others Potential: No data recorded Additional Comments for Danger to Others Potential: No data recorded Are There Guns or Other Weapons in Your Home? No data recorded Types of Guns/Weapons: No data recorded Are These Weapons Safely Secured?                            No data recorded Who Could Verify You Are Able To Have These Secured: No data recorded Do You Have any Outstanding Charges, Pending Court Dates, Parole/Probation? No data recorded Contacted To Inform of Risk of Harm To Self or Others: No data recorded   Does Patient Present under Involuntary Commitment? No  IVC Papers Initial File Date: No data recorded  [place] of Residence: [place]   Patient Currently Receiving the Following Services: Medication Management   Determination of Need: Urgent (48 hours)   Options For Referral: Other: Comment (Observe in ED overnight and psychiatry to review on 04/28.)     CCA Biopsychosocial Patient Reported Schizophrenia/Schizoaffective Diagnosis in Past: Yes   Strengths: Likes playing the piano.   Mental Health Symptoms Depression:   Change in energy/activity; Hopelessness; Increase/decrease in appetite; Difficulty Concentrating; Sleep (too much or little)   Duration of Depressive symptoms:  Duration of Depressive Symptoms: Greater than two weeks   Mania:   None   Anxiety:    Difficulty concentrating; Restlessness; Tension; Worrying   Psychosis:   Hallucinations   Duration of Psychotic symptoms:  Duration of Psychotic Symptoms: Greater than six months   Trauma:   Difficulty staying/falling asleep   Obsessions:   None   Compulsions:   None   Inattention:   None   Hyperactivity/Impulsivity:   None   Oppositional/Defiant  Behaviors:   None   Emotional Irregularity:   Chronic feelings of emptiness; Recurrent suicidal behaviors/gestures/threats   Other Mood/Personality Symptoms:  No data recorded   Mental Status Exam Appearance and self-care  Stature:   Small   Weight:   Average weight   Clothing:  No data recorded  Grooming:   Normal   Cosmetic use:   None   Posture/gait:   Normal   Motor activity:   Restless   Sensorium  Attention:   Normal   Concentration:   Anxiety interferes   Orientation:   X5   Recall/memory:   Defective in Recent   Affect and Mood  Affect:   Anxious; Depressed   Mood:   Anxious; Depressed   Relating  Eye contact:   Normal (Pt also looks behind her because she is hearing things.)   Facial expression:  Anxious   Attitude toward examiner:   Cooperative   Thought and Language  Speech flow:  Clear and Coherent   Thought content:   Appropriate to Mood and Circumstances   Preoccupation:   Suicide   Hallucinations:   Auditory; Tactile; Visual   Organization:  No data recorded  [employer] of Knowledge:   Average   Intelligence:   Average   Abstraction:   Functional   Judgement:   Fair   Reality Testing:   Adequate   Insight:   Fair   Decision Making:   Only simple   Social Functioning  Social Maturity:   Isolates   Social Judgement:   Normal   Stress  Stressors:   Transitions; Housing   Coping Ability:   Overwhelmed   Skill Deficits:   Decision making; Interpersonal; Responsibility   Supports:   Friends/Service system; Family     Religion: Religion/Spirituality Are You A Religious Person?: No  Leisure/Recreation: Leisure / Recreation Do You Have Hobbies?: Yes Leisure and Hobbies: Swinging on swings; listening to music, watching horror movies.  Exercise/Diet: Exercise/Diet Do You Exercise?: No Have You Gained or Lost A Significant Amount of Weight in the Past Six Months?: No Do  You Have Any Trouble Sleeping?: Yes Explanation of Sleeping Difficulties: Pt says she may sleep well one night then not any the next night.   CCA Employment/Education Employment/Work Situation: Employment / Work Systems developer: On disability Why is Patient on Disability: Schizophrenia How Long has Patient Been on Disability: Over 5 years Has Patient ever Been in the [employer]?: No  Education: Education Is Patient Currently Attending School?: No Last Grade Completed: 12   CCA Family/Childhood History Family and Relationship History: Family history Marital status: Single Does patient have children?: No  Childhood History:  Childhood History By whom was/is the patient raised?: Both parents, Adoptive parents Did patient suffer any verbal/emotional/physical/sexual abuse as a child?: No Has patient ever been sexually abused/assaulted/raped as an adolescent or adult?: No Witnessed domestic violence?: No Has patient been affected by domestic violence as an adult?: No  Child/Adolescent Assessment:     CCA Substance Use Alcohol/Drug Use: Alcohol / Drug Use Pain Medications: See PTA medication list Prescriptions: See PTA medication list Over the Counter: Tylenol or Ibuprophen as needed. History of alcohol / drug use?: No history of alcohol / drug abuse                         ASAM's:  Six Dimensions of Multidimensional Assessment  Dimension 1:  Acute Intoxication and/or Withdrawal Potential:      Dimension 2:  Biomedical Conditions and Complications:      Dimension 3:  Emotional, Behavioral, or Cognitive Conditions and Complications:     Dimension 4:  Readiness to Change:     Dimension 5:  Relapse, Continued use, or Continued Problem Potential:     Dimension 6:  Recovery/Living Environment:     ASAM Severity Score:    ASAM Recommended Level of Treatment:     Substance use Disorder (SUD)    Recommendations for Services/Supports/Treatments:     Discharge Disposition:    DSM5 Diagnoses: Patient Active Problem List   Diagnosis Date Noted   Bipolar I disorder, current or most recent episode depressed, with psychotic features (HCC) 05/19/2018   HTN (hypertension) 05/19/2018   GERD (gastroesophageal reflux disease) 05/19/2018   Asthma 05/19/2018   MDD (major depressive disorder), severe (HCC) 09/09/2017   Autism  spectrum disorder 05/17/2017   MDD (major depressive disorder), recurrent severe, without psychosis (HCC) 05/16/2017     Referrals to Alternative Service(s): Referred to Alternative Service(s):   Place:   Date:   Time:    Referred to Alternative Service(s):   Place:   Date:   Time:    Referred to Alternative Service(s):   Place:   Date:   Time:    Referred to Alternative Service(s):   Place:   Date:   Time:     Wandra Mannan

## 2021-12-27 NOTE — ED Provider Notes (Signed)
?[facility] EMERGENCY DEPARTMENT ?Provider Note ? ? ?CSN: [id number] ?Arrival date & time: 12/27/21  1509 ? ?  ? ?History ? ?Chief Complaint  ?Patient presents with  ? Suicidal  ? Chest Pain  ? ? ?Brittany Palmer is a 26 y.o. female. ? ?HPI ?Adult female lives in a group home with multiple medical problems including schizophrenia, depression, bipolar now presents with chest pain, anxiousness, suicidal ideation.  Onset seems within the past days, and there is associated auditory hallucination, contributing to the patient's anxiousness about her current situation.  She takes all medication as directed, reportedly, but is unclear if she is taking any pain medication for her chest pain which is sternal, sore.  She is unaware of fever, though she seemingly has 1 here in triage.  Some limitation secondary to the patient's history of psychiatric disease. ?  ? ?Home Medications ?Prior to Admission medications   ?Medication Sig Start Date End Date Taking? Authorizing Provider  ?albuterol (PROVENTIL HFA;VENTOLIN HFA) 108 (90 Base) MCG/ACT inhaler Inhale 2 puffs into the lungs every 6 (six) hours as needed for wheezing or shortness of breath.    Yes [provider]  ?ARIPiprazole (ABILIFY) 15 MG tablet Take 15 mg by mouth daily.   Yes [provider]  ?budesonide-formoterol (SYMBICORT) 160-4.5 MCG/ACT inhaler Inhale 2 puffs into the lungs 2 (two) times daily.   Yes [provider]  ?Cholecalciferol (VITAMIN D3) 50 MCG (2000 UT) capsule Take 4,000 Units by mouth daily.   Yes [provider]  ?lamoTRIgine (LAMICTAL) 25 MG tablet Take 25 mg by mouth daily.   Yes [provider]  ?loratadine (CLARITIN) 10 MG tablet Take 10 mg by mouth daily.   Yes [provider]  ?QUEtiapine (SEROQUEL) 400 MG tablet Take 400 mg by mouth at bedtime.   Yes [provider]  ?verapamil (CALAN) 40 MG tablet Take 40 mg by mouth daily. Take 1 tablet by mouth at noon. *Check  B/P & Pulse weekly on Wednesdays   Yes [provider]  ?ARIPiprazole ER (ABILIFY MAINTENA) 400 MG SRER injection Inject 2 mLs (400 mg total) into the muscle every 28 (twenty-eight) days. Next injection in October 2019 ?Patient not taking: Reported on 12/16/2019 06/02/18   Pucilowska, Ellin Goodie, MD  ?cephALEXin (KEFLEX) 500 MG capsule Take 1 capsule (500 mg total) by mouth 3 (three) times daily. ?Patient not taking: Reported on 12/27/2021 12/16/19   Raeford Razor, MD  ?hydrOXYzine (ATARAX/VISTARIL) 50 MG tablet Take 1 tablet (50 mg total) by mouth 3 (three) times daily as needed for anxiety. ?Patient not taking: Reported on 12/16/2019 05/25/18   Pucilowska, Ellin Goodie, MD  ?mometasone-formoterol (DULERA) 100-5 MCG/ACT AERO Inhale 2 puffs into the lungs 2 (two) times daily. ?Patient not taking: Reported on 12/16/2019 05/25/18   Pucilowska, Braulio Conte B, MD  ?pantoprazole (PROTONIX) 40 MG tablet Take 1 tablet (40 mg total) by mouth daily. ?Patient not taking: Reported on 12/16/2019 05/24/17   Oneta Rack, NP  ?QUEtiapine (SEROQUEL) 300 MG tablet Take 1 tablet (300 mg total) by mouth at bedtime. ?Patient not taking: Reported on 12/27/2021 05/25/18   Shari Prows, MD  ?   ? ?Allergies    ?Fluoxetine, Lisinopril, and Risperdal [risperidone]   ? ?Review of Systems   ?Review of Systems  ?Unable to perform ROS: Psychiatric disorder  ? ?Physical Exam ?Updated Vital Signs ?BP (!) 154/106   Pulse (!) 118   Temp (!) 100.5 ?F (38.1 ?C) (Oral)  Resp (!) 26   SpO2 97%  ?Physical Exam ?Vitals and nursing note reviewed.  ?Constitutional:   ?   General: She is not in acute distress. ?   Appearance: She is well-developed.  ?HENT:  ?   Head: Normocephalic and atraumatic.  ?Eyes:  ?   Conjunctiva/sclera: Conjunctivae normal.  ?Cardiovascular:  ?   Rate and Rhythm: Normal rate and regular rhythm.  ?Pulmonary:  ?   Effort: Pulmonary effort is normal. No respiratory distress.  ?   Breath sounds: Normal breath sounds. No  stridor.  ?Abdominal:  ?   General: There is no distension.  ?Skin: ?   General: Skin is warm and dry.  ?Neurological:  ?   Mental Status: She is alert and oriented to person, place, and time.  ?   Cranial Nerves: No cranial nerve deficit.  ?Psychiatric:     ?   Mood and Affect: Mood is anxious. Affect is blunt.     ?   Thought Content: Thought content includes suicidal ideation.  ? ? ?ED Results / Procedures / Treatments   ?Labs ?(all labs ordered are listed, but only abnormal results are displayed) ?Labs Reviewed  ?RESP PANEL BY RT-PCR (FLU A&B, COVID) ARPGX2  ?ACETAMINOPHEN LEVEL  ?SALICYLATE LEVEL  ?RAPID URINE DRUG SCREEN, HOSP PERFORMED  ?ETHANOL  ?I-STAT BETA HCG BLOOD, ED ([facility], [facility], [facility] ONLY)  ?TROPONIN I (HIGH SENSITIVITY)  ? ? ?EKG ?EKG Interpretation ? ?Date/Time:  Thursday December 27 2021 15:20:30 EDT ?Ventricular Rate:  116 ?PR Interval:  116 ?QRS Duration: 72 ?QT Interval:  308 ?QTC Calculation: 428 ?R Axis:   78 ?Text Interpretation: Sinus tachycardia Possible Left atrial enlargement Borderline ECG Confirmed by Carmin Muskrat [phone]) on 12/27/2021 4:09:59 PM ? ?Radiology ?DG Chest 2 View ? ?Result Date: 12/27/2021 ?CLINICAL DATA:  Mid chest discomfort for 2 weeks EXAM: CHEST - 2 VIEW COMPARISON:  05/20/2018 FINDINGS: The heart size and mediastinal contours are within normal limits. Both lungs are clear. The visualized skeletal structures are unremarkable. IMPRESSION: No active cardiopulmonary disease. Electronically Signed   By: Kathreen Devoid M.D.   On: 12/27/2021 16:11   ? ?Procedures ?Procedures  ? ? ?Medications Ordered in ED ?Medications  ?acetaminophen (TYLENOL) tablet 650 mg (has no administration in time range)  ?zolpidem (AMBIEN) tablet 5 mg (has no administration in time range)  ?ondansetron (ZOFRAN) tablet 4 mg (has no administration in time range)  ?LORazepam (ATIVAN) tablet 1 mg (has no administration in time range)  ?ondansetron (ZOFRAN-ODT) disintegrating tablet 4 mg (has no administration in  time range)  ?verapamil (CALAN) tablet 40 mg (has no administration in time range)  ?ARIPiprazole (ABILIFY) tablet 15 mg (has no administration in time range)  ?budesonide (PULMICORT) 180 MCG/ACT inhaler 2 puff (has no administration in time range)  ?lamoTRIgine (LAMICTAL) tablet 25 mg (has no administration in time range)  ?loratadine (CLARITIN) tablet 10 mg (has no administration in time range)  ?albuterol (VENTOLIN HFA) 108 (90 Base) MCG/ACT inhaler 1-2 puff (has no administration in time range)  ?QUEtiapine (SEROQUEL) tablet 400 mg (has no administration in time range)  ? ? ?ED Course/ Medical Decision Making/ A&P ?Clinical Course as of 12/27/21 1649  ?Thu Dec 27, 2021  ?1536 Patient will require medical clearance for active chest pain prior to TTS eval [CP]  ?  ?Clinical Course User Index ?[CP] Prosperi, Christian H, PA-C  ? ?                        ?  This patient with a Hx of schizophrenia, bipolar disorder, group home residency presents to the ED for concern of chest pain, suicidal ideation, this involves an extensive number of treatment options, and is a complaint that carries with it a high risk of complications and morbidity.   ? ?The differential diagnosis includes physiologic chest pain, including ACS less likely, pneumonia, pneumothorax, COVID, other infection, musculoskeletal and psychiatric concerns including suicidal ideation secondary to her schizophrenia, bipolar disorder/depression. ? ? ?Social Determinants of Health: ? ?Psychiatric disease, group home placement, possible autism ? ?Additional history obtained: ? ?Additional history and/or information obtained from chart review, notable for behavioral notes for ongoing management of bipolar ? ? ?After the initial evaluation, orders, including: Meds were initiated. ? ? ?Patient had IVC executed from triage. ? ?Patient placed on Cardiac and Pulse-Oximetry Monitors. ?The patient was maintained on a cardiac monitor.  The cardiac monitored showed an  rhythm of 110 sinus tach abnormal ?The patient was also maintained on pulse oximetry. The readings were typically 99% room air normal ? ? ?On repeat evaluation of the patient stayed the same ? ?Lab Tests: ? ?I persona

## 2021-12-27 NOTE — ED Notes (Signed)
Contact    [phone]  Group home when recommendations for treatment have been made ? ?

## 2021-12-27 NOTE — ED Triage Notes (Signed)
Pt reports to the ED accompanied by a worker from her group home for chest pain, "arms feeling different" , anxiety.  Pt reports she cannot make out what the voices are saying. Has been compliant with meds.  Pt reports SI with specific plan to hang herself.  ?

## 2021-12-28 DIAGNOSIS — F250 Schizoaffective disorder, bipolar type: Secondary | ICD-10-CM | POA: Diagnosis not present

## 2021-12-28 DIAGNOSIS — R45851 Suicidal ideations: Secondary | ICD-10-CM

## 2021-12-28 DIAGNOSIS — F209 Schizophrenia, unspecified: Secondary | ICD-10-CM | POA: Insufficient documentation

## 2021-12-28 DIAGNOSIS — R443 Hallucinations, unspecified: Secondary | ICD-10-CM

## 2021-12-28 NOTE — ED Notes (Signed)
Pt ambulatory to waiting room. Pt verbalized understanding of discharge instructions.   

## 2021-12-28 NOTE — Consult Note (Addendum)
Telepsych Consultation  ? ?Reason for Consult:  psych consult  ?Referring Physician: Olene Floss, PA-C ?Location of Patient: [facility] ?Location of Provider: [facility] ? ?Patient Identification: Brittany Palmer ?MRN:  [id number] ?Principal Diagnosis: Suicidal ideations ?Diagnosis:  Principal Problem: ?  Suicidal ideations ?Active Problems: ?  Autism spectrum disorder ?  Hallucination ? ? ?Total Time spent with patient: 20 minutes ? ?Subjective:   ?Brittany Palmer is a 26 y.o. female patient admitted to [facility] reporting hallucinations, anxiety and suicidal ideations. Patient has a past psychiatric history significant for schizophrenia, depression, bipolar and Autism. ? ?HPI:  Patient seen via tele health by this provider; chart reviewed and consulted with Dr. Lucianne Muss on 12/28/21. On evaluation Brittany Palmer is sitting up in a chair facing the camera with good eye contact.  Her thought process is logical and relevant. Her speech is clear and coherent. Her mood is depressed and affect is congruent. ? ?Patient states she's had suicidal ideations on and off for quite some times. She states that the suicidal thoughts were really bad yesterday after she felt like the group home staff members were not taking her seriously and made her feel like they do not care about her not feeling well. She reports living at [facility] group home. She describes her feelings yesterday as anger, rage, and anxious. She reports having hallucinations for years that she describes the hallucinations as seeing ghosts at night, and feeling things touch her. She states that earlier today she was seeing crosses but right now she is not having any hallucinations. There is no objective evidence that the patient is currently responding to internal or external stimuli. Patient denies experimenting with drugs or alcohol. Patient states that she is compliant with taking her medications. She states that she has not followed up with her psychiatrist in over a  year. She states that she does not have therapy.  ? ?I discussed with the patient and recommended that she follow up with outpatient psychiatry for medication management to help reduce symptoms of hallucinations. I discussed with the patient following up with outpatient counseling to address stressors pertaining to the group home environment. Patient verbalizes understanding and agrees to the stated plan.  ? ?I contacted the patient's legal guardian and mother Brittany Palmer [phone]. Brittany Palmer states that the patient has had these issues since she was age 51. She states that she is not sure if the patient has outpatient psychiatry or therapy as the patient visits only visits on the holidays and lives in another town.  She states that the patient does live in a group home and provides a number for Verda Cumins who she states is the group home owner. Mrs. Luepke was advised that the patient is psychiatrically cleared for discharge with recommendations for follow up with outpatient psychiatry and counseling. No concerns verbalized.  ? ?I contacted Verda Cumins [phone] twice no answer.   ? ?I spoke to [place], Charity fundraiser., [phone] at the [facility] home who states that the patient receives behavioral health services through [facility], including medication management and therapy. She states that the patient is seen monthly. She states that the patient will be seen next week by the behavioral health team. She states that the patient was brought in yesterday after she wrote a message stating that she tried to warn [place] and she will seek revenge. Patient also wrote, my God is a forgiving God. She states that yesterday was the first time that the patient expressed  having hallucinations. She states that in the past the patient has reported being upset with staff for not addressing her complaints. She states that when the patient is ready for discharge she will coordinate transportation back to the group home.  Patient does not have access to weapons, sharp objects, or medications. ? ?Past Psychiatric History: hx of schizophrenia, depression, bipolar and Autism. ? ?Past Medical History:  ?Past Medical History:  ?Diagnosis Date  ? Autism   ? GERD (gastroesophageal reflux disease)   ? Hypertension   ?  ?Past Surgical History:  ?Procedure Laterality Date  ? TONSILLECTOMY    ? ?Family History: No family history on file. ?Family Psychiatric  History: no hx reported. ?Social History:  ?Social History  ? ?Substance and Sexual Activity  ?Alcohol Use No  ?   ?Social History  ? ?Substance and Sexual Activity  ?Drug Use No  ?  ?Social History  ? ?Socioeconomic History  ? Marital status: Single  ?  Spouse name: Not on file  ? Number of children: Not on file  ? Years of education: Not on file  ? Highest education level: Not on file  ?Occupational History  ? Not on file  ?Tobacco Use  ? Smoking status: Never  ? Smokeless tobacco: Never  ?Substance and Sexual Activity  ? Alcohol use: No  ? Drug use: No  ? Sexual activity: Never  ?Other Topics Concern  ? Not on file  ?Social History Narrative  ? Not on file  ? ?Social Determinants of Health  ? ?Financial Resource Strain: Not on file  ?Food Insecurity: Not on file  ?Transportation Needs: Not on file  ?Physical Activity: Not on file  ?Stress: Not on file  ?Social Connections: Not on file  ? ?Additional Social History: ?  ? ?Allergies:   ?Allergies  ?Allergen Reactions  ? Fluoxetine Other (See Comments) and Rash  ?  Patient reports allergy and rash across neck when trying this medication.  ? Lisinopril Cough  ? Risperdal [Risperidone] Other (See Comments)  ?  Twitching of mouth   ? ? ?Labs:  ?Results for orders placed or performed during the hospital encounter of 12/27/21 (from the past 48 hour(s))  ?Resp Panel by RT-PCR (Flu A&B, Covid) Nasopharyngeal Swab     Status: None  ? Collection Time: 12/27/21  3:29 PM  ? Specimen: Nasopharyngeal Swab; Nasopharyngeal(NP) swabs in vial transport  medium  ?Result Value Ref Range  ? SARS Coronavirus 2 by RT PCR NEGATIVE NEGATIVE  ?  Comment: (NOTE) ?SARS-CoV-2 target nucleic acids are NOT DETECTED. ? ?The SARS-CoV-2 RNA is generally detectable in upper respiratory ?specimens during the acute phase of infection. The lowest ?concentration of SARS-CoV-2 viral copies this assay can detect is ?138 copies/mL. A negative result does not preclude SARS-Cov-2 ?infection and should not be used as the sole basis for treatment or ?other patient management decisions. A negative result may occur with  ?improper specimen collection/handling, submission of specimen other ?than nasopharyngeal swab, presence of viral mutation(s) within the ?areas targeted by this assay, and inadequate number of viral ?copies(<138 copies/mL). A negative result must be combined with ?clinical observations, patient history, and epidemiological ?information. The expected result is Negative. ? ?Fact Sheet for Patients:  ?BloggerCourse.com ? ?Fact Sheet for Healthcare Providers:  ?SeriousBroker.it ? ?This test is no t yet approved or cleared by the [place] FDA and  ?has been authorized for detection and/or diagnosis of SARS-CoV-2 by ?FDA under an Emergency Use Authorization (EUA). This EUA will  remain  ?in effect (meaning this test can be used) for the duration of the ?COVID-19 declaration under Section 564(b)(1) of the Act, 21 ?U.S.C.section 360bbb-3(b)(1), unless the authorization is terminated  ?or revoked sooner.  ? ? ?  ? Influenza A by PCR NEGATIVE NEGATIVE  ? Influenza B by PCR NEGATIVE NEGATIVE  ?  Comment: (NOTE) ?The Xpert Xpress SARS-CoV-2/FLU/RSV plus assay is intended as an aid ?in the diagnosis of influenza from Nasopharyngeal swab specimens and ?should not be used as a sole basis for treatment. Nasal washings and ?aspirates are unacceptable for Xpert Xpress SARS-CoV-2/FLU/RSV ?testing. ? ?Fact Sheet for  Patients: ?BloggerCourse.comhttps://www.fda.gov/media/152166/download ? ?Fact Sheet for Healthcare Providers: ?SeriousBroker.ithttps://www.fda.gov/media/152162/download ? ?This test is not yet approved or cleared by the [place] FDA and ?has been authorized for [employer]

## 2021-12-28 NOTE — ED Notes (Signed)
Spoke with Betsy at group home and gave report.  ?

## 2021-12-28 NOTE — Discharge Instructions (Addendum)
Discharge recommendations:  ?Follow up with [facility] for outpatient services for medication management and therapy.  ? ?Therapy: We recommend that patient participate in individual therapy to address mental health concerns. ? ?Medications: The parent/guardian is to contact a medical professional and/or outpatient provider to address any new side effects that develop. Parent/guardian should update outpatient providers of any new medications and/or medication changes.  ? ?Atypical antipsychotics: If you are prescribed an atypical antipsychotic, it is recommended that your height, weight, BMI, blood pressure, fasting lipid panel, and fasting blood sugar be monitored by your outpatient providers. ? ?Safety:  ?The patient should abstain from use of illicit substances/drugs and abuse of any medications. ?If symptoms worsen or do not continue to improve or if the patient becomes actively suicidal or homicidal then it is recommended that the patient return to the closest hospital emergency department, the [facility], or call 911 for further evaluation and treatment. ?National Suicide Prevention Lifeline 1-800-SUICIDE or 9732423621. ? ?About 988 ?988 offers 24/7 access to trained crisis counselors who can help people experiencing mental health-related distress. People can call or text 988 or chat 988lifeline.org for themselves or if they are worried about a loved one who may need crisis support.  ? ? ? ? ?  ?

## 2021-12-28 NOTE — ED Provider Notes (Signed)
Patient was evaluated by the psychiatry service.  They feel she is stable for discharge and appropriate for outpatient follow-up ?  ?Linwood Dibbles, MD ?12/28/21 1703 ? ?

## 2021-12-28 NOTE — ED Notes (Signed)
Pt resting comfortably, rise and fall of the chest noted ?

## 2022-05-03 ENCOUNTER — Emergency Department (HOSPITAL_COMMUNITY): Payer: Medicare Other

## 2022-05-03 ENCOUNTER — Other Ambulatory Visit: Payer: Self-pay

## 2022-05-03 ENCOUNTER — Emergency Department (HOSPITAL_COMMUNITY)
Admission: EM | Admit: 2022-05-03 | Discharge: 2022-05-04 | Disposition: A | Discharge status: Other Acute Inpatient Hospital | Payer: Medicare Other | Attending: Emergency Medicine | Admitting: Emergency Medicine

## 2022-05-03 ENCOUNTER — Encounter (HOSPITAL_COMMUNITY): Payer: Self-pay

## 2022-05-03 DIAGNOSIS — F250 Schizoaffective disorder, bipolar type: Secondary | ICD-10-CM | POA: Insufficient documentation

## 2022-05-03 DIAGNOSIS — J45909 Unspecified asthma, uncomplicated: Secondary | ICD-10-CM | POA: Insufficient documentation

## 2022-05-03 DIAGNOSIS — E162 Hypoglycemia, unspecified: Secondary | ICD-10-CM | POA: Insufficient documentation

## 2022-05-03 DIAGNOSIS — R079 Chest pain, unspecified: Secondary | ICD-10-CM

## 2022-05-03 DIAGNOSIS — F322 Major depressive disorder, single episode, severe without psychotic features: Secondary | ICD-10-CM | POA: Diagnosis not present

## 2022-05-03 DIAGNOSIS — R0789 Other chest pain: Secondary | ICD-10-CM | POA: Diagnosis not present

## 2022-05-03 DIAGNOSIS — F329 Major depressive disorder, single episode, unspecified: Secondary | ICD-10-CM | POA: Insufficient documentation

## 2022-05-03 DIAGNOSIS — R45851 Suicidal ideations: Secondary | ICD-10-CM | POA: Insufficient documentation

## 2022-05-03 DIAGNOSIS — Z7951 Long term (current) use of inhaled steroids: Secondary | ICD-10-CM | POA: Diagnosis not present

## 2022-05-03 DIAGNOSIS — Z79899 Other long term (current) drug therapy: Secondary | ICD-10-CM | POA: Diagnosis not present

## 2022-05-03 DIAGNOSIS — Z20822 Contact with and (suspected) exposure to covid-19: Secondary | ICD-10-CM | POA: Insufficient documentation

## 2022-05-03 DIAGNOSIS — I10 Essential (primary) hypertension: Secondary | ICD-10-CM | POA: Insufficient documentation

## 2022-05-03 DIAGNOSIS — E860 Dehydration: Secondary | ICD-10-CM | POA: Insufficient documentation

## 2022-05-03 DIAGNOSIS — F840 Autistic disorder: Secondary | ICD-10-CM | POA: Insufficient documentation

## 2022-05-03 HISTORY — DX: Unspecified asthma, uncomplicated: J45.909

## 2022-05-03 LAB — CBC
HCT: 48.4 % — ABNORMAL HIGH (ref 36.0–46.0)
Hemoglobin: 16.4 g/dL — ABNORMAL HIGH (ref 12.0–15.0)
MCH: 30.1 pg (ref 26.0–34.0)
MCHC: 33.9 g/dL (ref 30.0–36.0)
MCV: 89 fL (ref 80.0–100.0)
Platelets: 263 10*3/uL (ref 150–400)
RBC: 5.44 MIL/uL — ABNORMAL HIGH (ref 3.87–5.11)
RDW: 11.9 % (ref 11.5–15.5)
WBC: 7.2 10*3/uL (ref 4.0–10.5)
nRBC: 0 % (ref 0.0–0.2)

## 2022-05-03 LAB — BASIC METABOLIC PANEL
Anion gap: 14 (ref 5–15)
BUN: 21 mg/dL — ABNORMAL HIGH (ref 6–20)
CO2: 21 mmol/L — ABNORMAL LOW (ref 22–32)
Calcium: 9.5 mg/dL (ref 8.9–10.3)
Chloride: 105 mmol/L (ref 98–111)
Creatinine, Ser: 1.13 mg/dL — ABNORMAL HIGH (ref 0.44–1.00)
GFR, Estimated: >60 mL/min (ref >60)
Glucose, Bld: 62 mg/dL — ABNORMAL LOW (ref 70–99)
Potassium: 3.9 mmol/L (ref 3.5–5.1)
Sodium: 140 mmol/L (ref 135–145)

## 2022-05-03 LAB — CBG MONITORING, ED: Glucose-Capillary: 106 mg/dL — ABNORMAL HIGH (ref 70–99)

## 2022-05-03 LAB — RESP PANEL BY RT-PCR (FLU A&B, COVID) ARPGX2
Influenza A by PCR: NEGATIVE
Influenza B by PCR: NEGATIVE
SARS Coronavirus 2 by RT PCR: NEGATIVE

## 2022-05-03 LAB — TROPONIN I (HIGH SENSITIVITY): Troponin I (High Sensitivity): 3 ng/L (ref <18)

## 2022-05-03 LAB — D-DIMER, QUANTITATIVE: D-Dimer, Quant: <0.27 ug/mL-FEU (ref 0.00–0.50)

## 2022-05-03 LAB — HCG, QUANTITATIVE, PREGNANCY: hCG, Beta Chain, Quant, S: <1 m[IU]/mL (ref <5)

## 2022-05-03 MED ORDER — MOMETASONE FURO-FORMOTEROL FUM 200-5 MCG/ACT IN AERO
2.0000 | INHALATION_SPRAY | Freq: Two times a day (BID) | RESPIRATORY_TRACT | Status: DC
  Filled 2022-05-03: qty 8.8

## 2022-05-03 MED ORDER — DEXTROSE-NACL 5-0.45 % IV SOLN: INTRAVENOUS | Status: DC

## 2022-05-03 MED ORDER — LORATADINE 10 MG PO TABS
10.0000 mg | ORAL_TABLET | Freq: Every day | ORAL | Status: DC
  Administered 2022-05-03: 10 mg via ORAL
  Filled 2022-05-03: qty 1

## 2022-05-03 MED ORDER — SODIUM CHLORIDE 0.9 % IV SOLN
1000.0000 mL | INTRAVENOUS | Status: DC
  Administered 2022-05-03: 1000 mL via INTRAVENOUS

## 2022-05-03 MED ORDER — LAMOTRIGINE 25 MG PO TABS
25.0000 mg | ORAL_TABLET | Freq: Every day | ORAL | Status: DC
  Administered 2022-05-03: 25 mg via ORAL
  Filled 2022-05-03: qty 1

## 2022-05-03 MED ORDER — DEXTROSE 50 % IV SOLN
25.0000 mL | Freq: Once | INTRAVENOUS | Status: AC
  Administered 2022-05-03: 25 mL via INTRAVENOUS
  Filled 2022-05-03: qty 50

## 2022-05-03 MED ORDER — VERAPAMIL HCL 80 MG PO TABS
40.0000 mg | ORAL_TABLET | Freq: Two times a day (BID) | ORAL | Status: DC
  Administered 2022-05-03: 40 mg via ORAL
  Filled 2022-05-03: qty 0.5
  Filled 2022-05-03 (×2): qty 1

## 2022-05-03 MED ORDER — ARIPIPRAZOLE 5 MG PO TABS
15.0000 mg | ORAL_TABLET | Freq: Every day | ORAL | Status: DC
  Administered 2022-05-03: 15 mg via ORAL
  Filled 2022-05-03: qty 1

## 2022-05-03 MED ORDER — SODIUM CHLORIDE 0.9 % IV BOLUS (SEPSIS)
1000.0000 mL | Freq: Once | INTRAVENOUS | Status: AC
  Administered 2022-05-03: 1000 mL via INTRAVENOUS

## 2022-05-03 MED ORDER — QUETIAPINE FUMARATE 300 MG PO TABS
400.0000 mg | ORAL_TABLET | Freq: Every day | ORAL | Status: DC
  Administered 2022-05-03: 400 mg via ORAL
  Filled 2022-05-03: qty 1

## 2022-05-03 MED ORDER — ALBUTEROL SULFATE HFA 108 (90 BASE) MCG/ACT IN AERS: 2.0000 | INHALATION_SPRAY | Freq: Four times a day (QID) | RESPIRATORY_TRACT | Status: DC | PRN

## 2022-05-03 NOTE — ED Notes (Signed)
Pt. Has a ham sandwich and 2 apple juices sitting on bedside table to eat whenever she gets ready. Group home person that sitting in the room with patient said, Brittany Palmer is very hungry she ate the crackers and cheese very fast.

## 2022-05-03 NOTE — ED Notes (Signed)
Pt. Drink 2 sodas and ate cheese and several crackers.

## 2022-05-03 NOTE — BH Assessment (Signed)
Comprehensive Clinical Assessment (CCA) Note  05/03/2022 Brittany Palmer [id number]  Chief Complaint:  Chief Complaint  Patient presents with   Chest Pain   Anxiety   Depression   Visit Diagnosis:  Per chart: Schizophrenia F41.1 Generalized anxiety disorder  F34.8 Disruptive mood dysregulation disorder  Flowsheet Row ED from 05/03/2022 in [facility] ED from 12/27/2021 in [facility] EMERGENCY DEPARTMENT ED from 12/15/2019 in [facility] EMERGENCY DEPARTMENT  C-SSRS RISK CATEGORY High Risk High Risk High Risk      The patient demonstrates the following risk factors for suicide: Chronic risk factors for suicide include: psychiatric disorder of schizophrenia, previous suicide attempts overdose, previous self-harm by cutting, and completed suicide in a family member. Acute risk factors for suicide include: social withdrawal/isolation. Protective factors for this patient include: positive social support, positive therapeutic relationship, and coping skills. Considering these factors, the overall suicide risk at this point appears to be high. Patient is not appropriate for outpatient follow up.  Disposition: S. Bobbitt NP, recommends [facility] for overnight observation and continuous assessment.  Disposition discussed with Statistician.  RN to discuss disposition with EDP.  Brittany Palmer is a 26 year old  female who presents voluntarily to [facility] and accompanied with Brittany Palmer, [facility], [phone]. TTS aslos spoke with [facility].  Ms. Brittany Palmer reports Pt "she was taken her medicine and then she stop eaten".  Pt reports SI with a plan to starve herself, "I have not eaten in five days".  When clinician asked if she wants to hurt herself today, Pt reports, "if I leave the hospital, I would more likely starve myself again".  Pt reports seeing crosses's, ghosts and bird like figures. Pt reports, "I  feel things touching me".  Pt reports prior suicide attempt three years by overdose on benadryl.  Pt reports prior history of intentional self injurious behaviors. Pt denies HI. Pt acknowledged the following symptoms: restlessness, fatigue, irritable, agitated, loss of interest, frustrated and  tension. Pt reports starving herself for five days, unable to report amount of  weight lost.  Pt reports that she is sleeping eight hours during the night.  Pt denies drinking alcohol or using any other substance use.    Pt identify her primary stressor as with a particular staff person that works in the group home, "that staff person has a nasty attitude, I wanted to teach her a lesson by taken myself out, by doing what I have been doing, starving myself".  Pt reports living in [facility] for three years "I like the group home". Pt reports her mother is her primary guardian, Brittany Palmer, [phone].  TTS spoke with Pt's guardian, Pt's guardian reports, "we removed her from the home, when she was 26 years old, due to uncontrollable behaviors, such as biting, and cutting herself.  Pt's mother reports a IDD/Autism diagnosis.  Pt reports her sister committed suicide.  Pt denies family history substance used. Pt denies any guns in the home.  Pt denies any current legal problems.  Pt says she is currently not receiving weekly outpatient therapy.  Pt reports receiving outpatient medication management with Dr. Minerva Palmer.  Pt reports she takes medication as prescribed.  Pt reports one previous inpatient psychiatric hospitalization in 2019.  Pt is dressed in scrubs, alert, oriented x 5 with grating voice and restless motor behavior.  Eye contact is normal.  Pt's mood is anxious and affect anxious.  Thought  process relevant,.  Pt's insight is fair and judgment is fair.  There is no indication Pt is currently responding to internal stimuli or experiencing delusional thought content. Pt was cooperative throughout  assessment.    CCA Screening, Triage and Referral (STR)  Patient Reported Information How did you hear about [place]? Primary Care  What Is the Reason for Your Visit/Call Today? Self harm; Pt reports self starvation for five days.  How Long Has This Been Causing You Problems? 1 wk - 1 month  What Do You Feel Would Help You the Most Today? Treatment for Depression or other mood problem; Stress Management; Support for unsafe relationship   Have You Recently Had Any Thoughts About Hurting Yourself? Yes  Are You Planning to Commit Suicide/Harm Yourself At This time? Yes   Have you Recently Had Thoughts About Hurting Someone Brittany Palmer? No  Are You Planning to Harm Someone at This Time? No  Explanation: No data recorded  Have You Used Any Alcohol or Drugs in the Past 24 Hours? No  How Long Ago Did You Use Drugs or Alcohol? No data recorded What Did You Use and How Much? No data recorded  Do You Currently Have a Therapist/Psychiatrist? No  Name of Therapist/Psychiatrist: Pt is unsure but says they come ot the day program.   Have You Been Recently Discharged From Any Office Practice or Programs? No  Explanation of Discharge From Practice/Program: No data recorded    CCA Screening Triage Referral Assessment Type of Contact: Tele-Assessment  Telemedicine Service Delivery: Telemedicine service delivery: This service was provided via telemedicine using a 2-way, interactive audio and video technology  Is this Initial or Reassessment? Initial Assessment  Date Telepsych consult ordered in [facility]:  05/03/22  Time Telepsych consult ordered in [facility]:  1529  Location of Assessment: [facility] ED  Provider Location: [facility] Assessment Services   Collateral Involvement: [facility] (assistant), [phone] participated in assessment.   Does Patient Have a Automotive engineer Guardian? No data recorded Name and Contact of Legal Guardian: No data recorded If Minor and Not  Living with Parent(s), Who has Custody? n/a  Is CPS involved or ever been involved? Never  Is APS involved or ever been involved? Never   Patient Determined To Be At Risk for Harm To Self or Others Based on Review of Patient Reported Information or Presenting Complaint? Yes, for Self-Harm  Method: No data recorded Availability of Means: No data recorded Intent: No data recorded Notification Required: No data recorded Additional Information for Danger to Others Potential: No data recorded Additional Comments for Danger to Others Potential: No data recorded Are There Guns or Other Weapons in Your Home? No data recorded Types of Guns/Weapons: No data recorded Are These Weapons Safely Secured?                            No data recorded Who Could Verify You Are Able To Have These Secured: No data recorded Do You Have any Outstanding Charges, Pending Court Dates, Parole/Probation? No data recorded Contacted To Inform of Risk of Harm To Self or Others: Guardian/MH POA:    Does Patient Present under Involuntary Commitment? No  IVC Papers Initial File Date: No data recorded  [place] of Residence: [place]   Patient Currently Receiving the Following Services: Group Home; Medication Management   Determination of Need: Urgent (48 hours)   Options For Referral: Inpatient Hospitalization     CCA Biopsychosocial  Patient Reported Schizophrenia/Schizoaffective Diagnosis in Past: Yes   Strengths: Likes playing the piano.   Mental Health Symptoms Depression:   Change in energy/activity; Increase/decrease in appetite; Sleep (too much or little); Weight gain/loss; Irritability   Duration of Depressive symptoms:    Mania:   None   Anxiety:    Difficulty concentrating; Restlessness; Tension; Worrying; Fatigue; Irritability   Psychosis:   Hallucinations   Duration of Psychotic symptoms:  Duration of Psychotic Symptoms: Less than six months   Trauma:   None   Obsessions:    None   Compulsions:   None   Inattention:   None   Hyperactivity/Impulsivity:   None   Oppositional/Defiant Behaviors:   None   Emotional Irregularity:   Chronic feelings of emptiness; Recurrent suicidal behaviors/gestures/threats   Other Mood/Personality Symptoms:   Depressed/Anxous    Mental Status Exam Appearance and self-care  Stature:   Small   Weight:   Average weight   Clothing:   -- (Pt dressed in scrubs.)   Grooming:   Normal   Cosmetic use:   None   Posture/gait:   Normal   Motor activity:   Restless; Agitated   Sensorium  Attention:   Normal   Concentration:   Anxiety interferes   Orientation:   X5   Recall/memory:   Normal   Affect and Mood  Affect:   Anxious; Depressed   Mood:   Anxious; Depressed   Relating  Eye contact:   Normal (Pt also looks behind her because she is hearing things.)   Facial expression:   Anxious   Attitude toward examiner:   Cooperative   Thought and Language  Speech flow:  Clear and Coherent   Thought content:   Appropriate to Mood and Circumstances   Preoccupation:   Suicide   Hallucinations:   Auditory; Tactile; Visual   Organization:  No data recorded  [employer] of Knowledge:   Average   Intelligence:   Average   Abstraction:   Functional   Judgement:   Fair   Reality Testing:   Adequate   Insight:   Fair   Decision Making:   Only simple   Social Functioning  Social Maturity:   Isolates   Social Judgement:   Normal   Stress  Stressors:   Relationship   Coping Ability:   Overwhelmed   Skill Deficits:   Decision making; Interpersonal; Responsibility   Supports:   Friends/Service system; Family     Religion: Religion/Spirituality Are You A Religious Person?: No How Might This Affect Treatment?: UTA  Leisure/Recreation: Leisure / Recreation Do You Have Hobbies?: Yes Leisure and Hobbies: Playing the piano, listening to  music  Exercise/Diet: Exercise/Diet Do You Exercise?: No Have You Gained or Lost A Significant Amount of Weight in the Past Six Months?: Yes-Lost (UTA) Number of Pounds Lost?:  (UTA) Do You Follow a Special Diet?: No (Pt reports starving herself for five days.) Do You Have Any Trouble Sleeping?: No Explanation of Sleeping Difficulties: Pt reports sleeping eight hours during the night.   CCA Employment/Education Employment/Work Situation: Employment / Work Systems developer: On disability Why is Patient on Disability: UTA How Long has Patient Been on Disability: UTA Patient's Job has Been Impacted by Current Illness: No (N/A) Has Patient ever Been in the [employer]?: No  Education: Education Is Patient Currently Attending School?: No Last Grade Completed: 12 Did You Attend College?: No Did You Have An Individualized Education Program (IIEP): No Did You Have Any  Difficulty At School?: No Patient's Education Has Been Impacted by Current Illness:  (UTA)   CCA Family/Childhood History Family and Relationship History: Family history Marital status: Single Does patient have children?: No  Childhood History:  Childhood History By whom was/is the patient raised?: Both parents, Adoptive parents Did patient suffer any verbal/emotional/physical/sexual abuse as a child?: No Did patient suffer from severe childhood neglect?: No Has patient ever been sexually abused/assaulted/raped as an adolescent or adult?: No Was the patient ever a victim of a crime or a disaster?: No Witnessed domestic violence?: No Has patient been affected by domestic violence as an adult?: No  Child/Adolescent Assessment:     CCA Substance Use Alcohol/Drug Use: Alcohol / Drug Use Pain Medications: See PTA medication list Prescriptions: See PTA medication list Over the Counter: Tylenol or Ibuprophen as needed. History of alcohol / drug use?: No history of alcohol / drug abuse                          ASAM's:  Six Dimensions of Multidimensional Assessment  Dimension 1:  Acute Intoxication and/or Withdrawal Potential:      Dimension 2:  Biomedical Conditions and Complications:      Dimension 3:  Emotional, Behavioral, or Cognitive Conditions and Complications:     Dimension 4:  Readiness to Change:     Dimension 5:  Relapse, Continued use, or Continued Problem Potential:     Dimension 6:  Recovery/Living Environment:     ASAM Severity Score:    ASAM Recommended Level of Treatment:     Substance use Disorder (SUD)    Recommendations for Services/Supports/Treatments: Recommendations for Services/Supports/Treatments Recommendations For Services/Supports/Treatments: Facility Based Crisis  Discharge Disposition:    DSM5 Diagnoses: Patient Active Problem List   Diagnosis Date Noted   Suicidal ideations 12/28/2021   Schizophrenic disorder (HCC) 12/28/2021   Hallucination 12/28/2021   Bipolar I disorder, current or most recent episode depressed, with psychotic features (HCC) 05/19/2018   HTN (hypertension) 05/19/2018   GERD (gastroesophageal reflux disease) 05/19/2018   Asthma 05/19/2018   MDD (major depressive disorder), severe (HCC) 09/09/2017   Autism spectrum disorder 05/17/2017   MDD (major depressive disorder), recurrent severe, without psychosis (HCC) 05/16/2017     Referrals to Alternative Service(s): Referred to Alternative Service(s):   Place:   Date:   Time:    Referred to Alternative Service(s):   Place:   Date:   Time:    Referred to Alternative Service(s):   Place:   Date:   Time:    Referred to Alternative Service(s):   Place:   Date:   Time:     Meryle Ready, Counselor

## 2022-05-03 NOTE — ED Triage Notes (Addendum)
Patient is from [facility] and staff member accompanied the patient. Patient states she is not eating and has not had food or drink x 6 days. Patient states that she wants to kill herself.  Patient denies any alcohol, drug use or HI.   Patient added during triage that she was having chest pain, but states she could not remember when it started.

## 2022-05-03 NOTE — ED Notes (Signed)
Staff member at [facility] called. Roselyn Bering, NP recommends overnight observation at [facility].

## 2022-05-03 NOTE — ED Provider Notes (Signed)
[facility]-EMERGENCY DEPT Provider Note   CSN: [id number] Arrival date & time: 05/03/22  1305     History  Chief Complaint  Patient presents with   Chest Pain    Brittany Palmer is a 26 y.o. female.   Chest Pain    Patient has a history of hypertension autism GERD and asthma.  Patient presents to the ED with complaints of depression suicidal ideation.  Patient states she has been trying to starve herself.  She has not had anything to eat or drink in 6 days.  She wants to die.  Patient also has had some intermittent chest discomfort and is felt her heart racing.  She lives in a group home and was sent to the ED for further evaluation.  Home Medications Prior to Admission medications   Medication Sig Start Date End Date Taking? Authorizing Provider  albuterol (PROVENTIL HFA;VENTOLIN HFA) 108 (90 Base) MCG/ACT inhaler Inhale 2 puffs into the lungs every 6 (six) hours as needed for wheezing or shortness of breath.    Yes [provider]  ARIPiprazole (ABILIFY) 15 MG tablet Take 15 mg by mouth daily.   Yes [provider]  budesonide-formoterol (SYMBICORT) 160-4.5 MCG/ACT inhaler Inhale 2 puffs into the lungs 2 (two) times daily.   Yes [provider]  Cholecalciferol (VITAMIN D3) 50 MCG (2000 UT) capsule Take 4,000 Units by mouth daily.   Yes [provider]  lamoTRIgine (LAMICTAL) 25 MG tablet Take 25 mg by mouth daily.   Yes [provider]  loratadine (CLARITIN) 10 MG tablet Take 10 mg by mouth daily.   Yes [provider]  QUEtiapine (SEROQUEL) 400 MG tablet Take 400 mg by mouth at bedtime.   Yes [provider]  verapamil (CALAN) 40 MG tablet Take 40 mg by mouth 2 (two) times daily.   Yes [provider]  ARIPiprazole ER (ABILIFY MAINTENA) 400 MG SRER injection Inject 2 mLs (400 mg total) into the muscle every 28 (twenty-eight) days. Next injection in October 2019 Patient not taking:  Reported on 12/16/2019 06/02/18   Pucilowska, Braulio Conte B, MD  cephALEXin (KEFLEX) 500 MG capsule Take 1 capsule (500 mg total) by mouth 3 (three) times daily. Patient not taking: Reported on 12/27/2021 12/16/19   Raeford Razor, MD  hydrOXYzine (ATARAX/VISTARIL) 50 MG tablet Take 1 tablet (50 mg total) by mouth 3 (three) times daily as needed for anxiety. Patient not taking: Reported on 12/16/2019 05/25/18   Pucilowska, Ellin Goodie, MD  mometasone-formoterol (DULERA) 100-5 MCG/ACT AERO Inhale 2 puffs into the lungs 2 (two) times daily. Patient not taking: Reported on 12/16/2019 05/25/18   Pucilowska, Braulio Conte B, MD  pantoprazole (PROTONIX) 40 MG tablet Take 1 tablet (40 mg total) by mouth daily. Patient not taking: Reported on 12/16/2019 05/24/17   Oneta Rack, NP  QUEtiapine (SEROQUEL) 300 MG tablet Take 1 tablet (300 mg total) by mouth at bedtime. Patient not taking: Reported on 12/27/2021 05/25/18   Shari Prows, MD      Allergies    Fluoxetine, Lisinopril, and Risperdal [risperidone]    Review of Systems   Review of Systems  Cardiovascular:  Positive for chest pain.    Physical Exam Updated Vital Signs BP 132/82   Pulse 76   Temp 98.1 F (36.7 C) (Oral)   Resp 15   Ht 1.651 m (5\' 5" )   Wt 62.9 kg   LMP 05/02/2022   SpO2 98%   BMI 23.06 kg/m  Physical  Exam Vitals and nursing note reviewed.  Constitutional:      General: She is not in acute distress.    Appearance: She is well-developed.  HENT:     Head: Normocephalic and atraumatic.     Comments: Mucous membranes dry    Right Ear: External ear normal.     Left Ear: External ear normal.  Eyes:     General: No scleral icterus.       Right eye: No discharge.        Left eye: No discharge.     Conjunctiva/sclera: Conjunctivae normal.  Neck:     Trachea: No tracheal deviation.  Cardiovascular:     Rate and Rhythm: Regular rhythm. Tachycardia present.  Pulmonary:     Effort: Pulmonary effort is normal. No respiratory  distress.     Breath sounds: Normal breath sounds. No stridor. No wheezing or rales.  Abdominal:     General: Bowel sounds are normal. There is no distension.     Palpations: Abdomen is soft.     Tenderness: There is no abdominal tenderness. There is no guarding or rebound.  Musculoskeletal:        General: No tenderness or deformity.     Cervical back: Neck supple.  Skin:    General: Skin is warm and dry.     Findings: No rash.  Neurological:     General: No focal deficit present.     Mental Status: She is alert.     Cranial Nerves: No cranial nerve deficit (no facial droop, extraocular movements intact, no slurred speech).     Sensory: No sensory deficit.     Motor: No abnormal muscle tone or seizure activity.     Coordination: Coordination normal.  Psychiatric:        Mood and Affect: Mood normal.     ED Results / Procedures / Treatments   Labs (all labs ordered are listed, but only abnormal results are displayed) Labs Reviewed  BASIC METABOLIC PANEL - Abnormal; Notable for the following components:      Result Value   CO2 21 (*)    Glucose, Bld 62 (*)    BUN 21 (*)    Creatinine, Ser 1.13 (*)    All other components within normal limits  CBC - Abnormal; Notable for the following components:   RBC 5.44 (*)    Hemoglobin 16.4 (*)    HCT 48.4 (*)    All other components within normal limits  HCG, QUANTITATIVE, PREGNANCY  D-DIMER, QUANTITATIVE  TROPONIN I (HIGH SENSITIVITY)    EKG EKG Interpretation  Date/Time:  Friday May 03 2022 14:27:19 EDT Ventricular Rate:  105 PR Interval:  128 QRS Duration: 70 QT Interval:  328 QTC Calculation: 434 R Axis:   80 Text Interpretation: Sinus tachycardia Right atrial enlargement RSR' in V1 or V2, probably normal variant No significant change since last tracing Confirmed by Linwood Dibbles [phone]) on 05/03/2022 3:29:46 PM  Radiology DG Chest 2 View  Result Date: 05/03/2022 CLINICAL DATA:  Chest pain. EXAM: CHEST - 2 VIEW  COMPARISON:  December 27, 2021. FINDINGS: The heart size and mediastinal contours are within normal limits. Both lungs are clear. The visualized skeletal structures are unremarkable. IMPRESSION: No active cardiopulmonary disease. Electronically Signed   By: Lupita Raider M.D.   On: 05/03/2022 14:24    Procedures Procedures    Medications Ordered in ED Medications  sodium chloride 0.9 % bolus 1,000 mL (1,000 mLs Intravenous New Bag/Given 05/03/22 1449)  Followed by  0.9 %  sodium chloride infusion (1,000 mLs Intravenous New Bag/Given 05/03/22 1459)  dextrose 5 %-0.45 % sodium chloride infusion (has no administration in time range)  dextrose 50 % solution 25 mL (25 mLs Intravenous Given 05/03/22 1535)    ED Course/ Medical Decision Making/ A&P Clinical Course as of 05/03/22 1626  Fri May 03, 2022  1527 Basic metabolic panel(!) Patient noted to have elevated BUN and creatinine.  Glucose also decreased [JK]  1527 CBC(!) nl [JK]  1532 Pt states she still refuses to eat or drink.   [JK]    Clinical Course User Index [JK] Linwood Dibbles, MD           HEART Score: 0                Medical Decision Making Problems Addressed: Chest pain, unspecified type: acute illness or injury Dehydration: acute illness or injury that poses a threat to life or bodily functions Hypoglycemia: acute illness or injury that poses a threat to life or bodily functions  Amount and/or Complexity of Data Reviewed Labs: ordered. Decision-making details documented in ED Course. Radiology: ordered and independent interpretation performed.  Risk Prescription drug management.  Patient presented with chest pain.  Initially tachycardic.  D-dimer negative troponin normal.  Her heart rate has improved.  I doubt ACS PE.  Patient has low risk heart score Patient presented to the ED for evaluation of dehydration anorexia.  Patient with suicidal ideation and has been refusing to eat and drink.  Patient noted to be dehydrated and  hypoglycemic.  I have ordered IV fluids and IV glucose.  Patient is still refusing to eat.  I am concerned that she will continue to have recurrent hypoglycemia considering her refusal to eat.  I will consult the medical service for admission.  Patient will need psychiatric evaluation while inpatient Waiting for call from hospitalist at shift change.  Dr Particia Nearing will follow up on that.       Final Clinical Impression(s) / ED Diagnoses Final diagnoses:  Hypoglycemia  Dehydration  Chest pain, unspecified type    Rx / DC Orders ED Discharge Orders     None         Linwood Dibbles, MD 05/03/22 1626

## 2022-05-03 NOTE — ED Provider Notes (Addendum)
Pt was going to be admitted to the hospitalist service as she has refused to eat/drink.  She is now agreeing to drink and is drinking.  At this time, she does not need a medical admission and is medically cleared.  TTS consult placed.  Meds ordered.   Jacalyn Lefevre, MD 05/03/22 2011  Pt has been accepted to [facility].  EMTALA done.  Pt remains stable for transfer.    Jacalyn Lefevre, MD 05/03/22 2251

## 2022-05-04 ENCOUNTER — Ambulatory Visit (INDEPENDENT_AMBULATORY_CARE_PROVIDER_SITE_OTHER)
Admission: EM | Admit: 2022-05-04 | Discharge: 2022-05-04 | Disposition: A | Discharge status: Home / Self Care | Payer: Medicare Other | Source: Home / Self Care

## 2022-05-04 DIAGNOSIS — F322 Major depressive disorder, single episode, severe without psychotic features: Secondary | ICD-10-CM

## 2022-05-04 DIAGNOSIS — R0789 Other chest pain: Secondary | ICD-10-CM | POA: Diagnosis not present

## 2022-05-04 DIAGNOSIS — F250 Schizoaffective disorder, bipolar type: Secondary | ICD-10-CM

## 2022-05-04 MED ORDER — LAMOTRIGINE 25 MG PO TABS
25.0000 mg | ORAL_TABLET | Freq: Every day | ORAL | Status: DC
  Administered 2022-05-04: 25 mg via ORAL
  Filled 2022-05-04: qty 1

## 2022-05-04 MED ORDER — ARIPIPRAZOLE 15 MG PO TABS
15.0000 mg | ORAL_TABLET | Freq: Every day | ORAL | Status: DC
  Administered 2022-05-04: 15 mg via ORAL
  Filled 2022-05-04: qty 1

## 2022-05-04 MED ORDER — LORATADINE 10 MG PO TABS
10.0000 mg | ORAL_TABLET | Freq: Every day | ORAL | Status: DC
  Administered 2022-05-04: 10 mg via ORAL
  Filled 2022-05-04: qty 1

## 2022-05-04 MED ORDER — QUETIAPINE FUMARATE 200 MG PO TABS: 400.0000 mg | ORAL_TABLET | Freq: Every day | ORAL | Status: DC

## 2022-05-04 MED ORDER — MAGNESIUM HYDROXIDE 400 MG/5ML PO SUSP: 30.0000 mL | Freq: Every day | ORAL | Status: DC | PRN

## 2022-05-04 MED ORDER — ALUM & MAG HYDROXIDE-SIMETH 200-200-20 MG/5ML PO SUSP: 30.0000 mL | ORAL | Status: DC | PRN

## 2022-05-04 MED ORDER — VERAPAMIL HCL 40 MG PO TABS
40.0000 mg | ORAL_TABLET | Freq: Two times a day (BID) | ORAL | Status: DC
  Filled 2022-05-04: qty 1

## 2022-05-04 MED ORDER — VERAPAMIL HCL 40 MG PO TABS
40.0000 mg | ORAL_TABLET | Freq: Two times a day (BID) | ORAL | Status: DC
  Filled 2022-05-04 (×4): qty 1

## 2022-05-04 MED ORDER — ACETAMINOPHEN 325 MG PO TABS: 650.0000 mg | ORAL_TABLET | Freq: Four times a day (QID) | ORAL | Status: DC | PRN

## 2022-05-04 NOTE — ED Notes (Signed)
Safe transport called and in route

## 2022-05-04 NOTE — Discharge Instructions (Signed)
Take all medications as prescribed. Keep all follow-up appointments as scheduled.  Do not consume alcohol or use illegal drugs while on prescription medications. Report any adverse effects from your medications to your primary care provider promptly.  In the event of recurrent symptoms or worsening symptoms, call 911, a crisis hotline, or go to the nearest emergency department for evaluation.   

## 2022-05-04 NOTE — ED Notes (Signed)
[facility] called report given to [facility]

## 2022-05-04 NOTE — ED Notes (Signed)
Patient is discharging at this time. Patient is A&Ox4. Vs stable. Patient denies SI,HI, and A/V/H with no plan/intent. Printed AVS reviewed with and given to patient along with medications and follow up appointments. Patient verbalized all understanding. All valuables/belongings returned to patient. Patient is being transported by group home vehicle. Patient denies any pain/discomfort. No s/s of current distress.

## 2022-05-04 NOTE — ED Notes (Signed)
Patient awake eating breakfast and A&Ox4. Patient denies SI currently but endorses auditory, tactile, and visual hallucinations. Patient states "seeing ghosts" and states she feels "People touching me at night."  Patient also says she hears muffled voices at times that she cant make out what they are saying. Patient is able to contract for safety while on unit. Patient denies any pain and showered this morning. Patient cooperative and remains safe on unit.

## 2022-05-04 NOTE — ED Notes (Signed)
Patient sleeping with no sxs of distress noted - will continue to monitor for safety 

## 2022-05-04 NOTE — ED Provider Notes (Signed)
FBC/OBS ASAP Discharge Summary  Date and Time: 05/04/2022 10:30 AM  Name: Brittany Palmer  MRN:  [id number]   Discharge Diagnoses:  Final diagnoses:  Schizoaffective disorder, bipolar type [facility])  MDD (major depressive disorder), severe (HCC)    Subjective: Brittany Palmer 26 year old female was recently transferred from the local emergency department due to suicidal ideations related to " unfair treatment " at her group home.  She reports she does not get along well with Ms. Marland KitchenT"  reports that she had to return back to the group home she will go back on a hunger strike. NP spoke to Ms. S Jones regarding current situation she reports the staff members are no longer working as they are 7 on 7 off.  States she was not familiar with the situation.  She denied any safety concerns with patient returning back to the facility.  NP spoke to patient's legal guardian regarding discharge disposition and consideration with changing group homes.  Father was receptive to plan requested to speak with his daughter.  "  This will take some time."    Patient has been eating and drinking since she as transfer to [facility]. Patient is medication compliant. Reported feeling better since she was able to follow-up with her father. Patient to discharge back to [facility], support, encourgment and reassurance was provided.   Per admission assessment note: "Patient resides at [facility] group home; patient reports that she has not been getting along with a female staff member at his home from home.  Patient states " this staff member has an nasty attitude and I am going to teach her a lesson by not eating."  Patient states that she plans to commit suicide by starving herself.  She reports that she has not eaten in about 5 days.  Patient reports that she drinks fluids and takes her medication at the group home. she also reports eating crackers and cheese while in the emergency room however she says she will stop eating  if discharged back to group home."   Total Time spent with patient: 15 minutes  Past Psychiatric History:  Past Medical History:  Past Medical History:  Diagnosis Date   Asthma    Autism    GERD (gastroesophageal reflux disease)    Hypertension     Past Surgical History:  Procedure Laterality Date   CYSTECTOMY     DENTAL SURGERY     TONSILLECTOMY     Family History: No family history on file. Family Psychiatric History: All Social History:  Social History   Substance and Sexual Activity  Alcohol Use No     Social History   Substance and Sexual Activity  Drug Use No    Social History   Socioeconomic History   Marital status: Single    Spouse name: Not on file   Number of children: Not on file   Years of education: Not on file   Highest education level: Not on file  Occupational History   Not on file  Tobacco Use   Smoking status: Never   Smokeless tobacco: Never  Vaping Use   Vaping Use: Never used  Substance and Sexual Activity   Alcohol use: No   Drug use: No   Sexual activity: Never  Other Topics Concern   Not on file  Social History Narrative   Not on file   Social Determinants of Health   Financial Resource Strain: Not on file  Food Insecurity: Not on file  Transportation Needs:  Not on file  Physical Activity: Not on file  Stress: Not on file  Social Connections: Not on file   SDOH:  SDOH Screenings   Alcohol Screen: Low Risk  (07/11/2017)  Tobacco Use: Low Risk  (05/03/2022)    Tobacco Cessation:  N/A, patient does not currently use tobacco products  Current Medications:  Current Facility-Administered Medications  Medication Dose Route Frequency Provider Last Rate Last Admin   acetaminophen (TYLENOL) tablet 650 mg  650 mg Oral Q6H PRN Ajibola, Ene A, NP       alum & mag hydroxide-simeth (MAALOX/MYLANTA) 200-200-20 MG/5ML suspension 30 mL  30 mL Oral Q4H PRN Ajibola, Ene A, NP       ARIPiprazole (ABILIFY) tablet 15 mg  15 mg Oral Daily  Ajibola, Ene A, NP   15 mg at 05/04/22 7371   lamoTRIgine (LAMICTAL) tablet 25 mg  25 mg Oral Daily Ajibola, Ene A, NP   25 mg at 05/04/22 0937   loratadine (CLARITIN) tablet 10 mg  10 mg Oral Daily Ajibola, Ene A, NP   10 mg at 05/04/22 0626   magnesium hydroxide (MILK OF MAGNESIA) suspension 30 mL  30 mL Oral Daily PRN Ajibola, Ene A, NP       QUEtiapine (SEROQUEL) tablet 400 mg  400 mg Oral QHS Ajibola, Ene A, NP       verapamil (CALAN) tablet 40 mg  40 mg Oral BID Nelly Rout, MD       Current Outpatient Medications  Medication Sig Dispense Refill   albuterol (PROVENTIL HFA;VENTOLIN HFA) 108 (90 Base) MCG/ACT inhaler Inhale 2 puffs into the lungs every 6 (six) hours as needed for wheezing or shortness of breath.      ARIPiprazole (ABILIFY) 15 MG tablet Take 15 mg by mouth daily.     budesonide-formoterol (SYMBICORT) 160-4.5 MCG/ACT inhaler Inhale 2 puffs into the lungs 2 (two) times daily.     Cholecalciferol (VITAMIN D3) 50 MCG (2000 UT) capsule Take 4,000 Units by mouth daily.     lamoTRIgine (LAMICTAL) 25 MG tablet Take 25 mg by mouth daily.     loratadine (CLARITIN) 10 MG tablet Take 10 mg by mouth daily.     mometasone-formoterol (DULERA) 100-5 MCG/ACT AERO Inhale 2 puffs into the lungs 2 (two) times daily. (Patient not taking: Reported on 12/16/2019) 1 Inhaler 1   QUEtiapine (SEROQUEL) 400 MG tablet Take 400 mg by mouth at bedtime.     verapamil (CALAN) 40 MG tablet Take 40 mg by mouth 2 (two) times daily.      PTA Medications: (Not in a hospital admission)       No data to display          Flowsheet Row ED from 05/03/2022 in [facility] from 12/27/2021 in [facility] EMERGENCY DEPARTMENT ED from 12/15/2019 in [facility] EMERGENCY DEPARTMENT  C-SSRS RISK CATEGORY High Risk High Risk High Risk       Musculoskeletal  Strength & Muscle Tone: within normal limits Gait & Station: normal Patient leans:  N/A  Psychiatric Specialty Exam  Presentation  General Appearance: Fairly Groomed  Eye Contact:Good  Speech:Clear and Coherent  Speech Volume:Normal  Handedness:Right   Mood and Affect  Mood:Depressed  Affect:Congruent   Thought Process  Thought Processes:Coherent  Descriptions of Associations:Intact  Orientation:Full (Time, Place and Person)  Thought Content:WDL  Diagnosis of Schizophrenia or Schizoaffective disorder in past: Yes  Duration of Psychotic Symptoms: Greater than six months  Hallucinations:Hallucinations: Auditory; Tactile; Visual Description of Auditory Hallucinations: "voice that sound demonic, muffled sounds" Description of Visual Hallucinations: "cross, birds, ghost"  Ideas of Reference:None  Suicidal Thoughts:Suicidal Thoughts: Yes, Active  Homicidal Thoughts:Homicidal Thoughts: No   Sensorium  Memory:Immediate Good; Recent Fair; Remote Fair  Judgment:Fair  Insight:Fair   Executive Functions  Concentration:Fair  Attention Span:Fair  Recall:Fair  Fund of Knowledge:Fair  Language:Fair   Psychomotor Activity  Psychomotor Activity:Psychomotor Activity: Normal   Assets  Assets:Communication Skills; Desire for Improvement; Housing; Physical Health   Sleep  Sleep:Sleep: Fair Number of Hours of Sleep: 6   Nutritional Assessment (For OBS and FBC admissions only) Has the patient had a weight loss or gain of 10 pounds or more in the last 3 months?: No Has the patient had a decrease in food intake/or appetite?: Yes Does the patient have dental problems?: No Does the patient have eating habits or behaviors that may be indicators of an eating disorder including binging or inducing vomiting?: Yes Has the patient recently lost weight without trying?: 0 Has the patient been eating poorly because of a decreased appetite?: 0 Malnutrition Screening Tool Score: 0    Physical Exam  Physical Exam Vitals and nursing note reviewed.   HENT:     Head: Normocephalic.  Cardiovascular:     Rate and Rhythm: Normal rate and regular rhythm.  Neurological:     Mental Status: She is alert.  Psychiatric:        Mood and Affect: Mood normal.        Thought Content: Thought content normal.    Review of Systems  Psychiatric/Behavioral:  Negative for suicidal ideas. The patient is nervous/anxious.   All other systems reviewed and are negative.  Blood pressure (!) 119/99, pulse 98, temperature 97.9 F (36.6 C), temperature source Oral, resp. rate 18, last menstrual period 05/02/2022, SpO2 99 %. There is no height or weight on file to calculate BMI.  Demographic Factors:  Caucasian  Loss Factors: NA  Historical Factors: NA  Risk Reduction Factors:   Positive social support and Positive therapeutic relationship  Continued Clinical Symptoms:  Depression:   Impulsivity  Cognitive Features That Contribute To Risk:  Closed-mindedness    Suicide Risk:  Minimal: No identifiable suicidal ideation.  Patients presenting with no risk factors but with morbid ruminations; may be classified as minimal risk based on the severity of the depressive symptoms  Plan Of Care/Follow-up recommendations:  Activity:  as tolerated Diet:  heart healthy   Disposition: Take all medications as prescribed. Keep all follow-up appointments as scheduled.  Do not consume alcohol or use illegal drugs while on prescription medications. Report any adverse effects from your medications to your primary care provider promptly.  In the event of recurrent symptoms or worsening symptoms, call 911, a crisis hotline, or go to the nearest emergency department for evaluation.    Oneta Rack, NP 05/04/2022, 10:30 AM

## 2022-05-04 NOTE — ED Notes (Signed)
Patient to the unit - endorses passive SI at this time - denies HI, AVH - will continue to monitor for safety

## 2022-05-04 NOTE — ED Provider Notes (Signed)
[facility] Continuous Assessment Admission H&P  Date: 05/04/22 Patient Name: Brittany Palmer MRN: [id number] Chief Complaint: No chief complaint on file.     Diagnoses:  Final diagnoses:  Schizoaffective disorder, bipolar type (HCC)    HPI: Brittany Palmer is a 26 year old female with history of schizoaffective disorder bipolar type, ADHD, ASD, and suicidal ideation.  Patient originally presented to [facility] reporting suicidal ideation.  She was evaluated and recommended for continuous assessment at [facility].  Patient was seen face-to-face and her chart was reviewed upon her arrival to [facility].  On evaluation, patient is alert and oriented x 4 she is calm and cooperative.  Patient resides at [facility]; patient reports that she has not been getting along with a female staff member at his home from home.  Patient states " this staff member has an nasty attitude and I am going to teach her a lesson by not eating."  Patient states that she plans to commit suicide by starving herself.  She reports that she has not eaten in about 5 days.  Patient reports that she drinks fluids and takes her medication at the group home. she also reports eating crackers and cheese while in the emergency room however she says she will stop eating if discharged back to group home.  Patient says she is unable to contract for her safety.  She endorses past history of multiple suicidal ideation; last suicidal attempt was 3 years ago via overdose Patient denies homicidal ideation, paranoia, and substance abuse.  Patient endorses visual hallucination of " process, ghosts, and birds."  She says she has auditory hallucination of " hearing more food demonic sounds" tactile hallucination of " feeling something crawling and touching me."  PHQ 2-9:   Flowsheet Row [facility]  C-SSRS RISK CATEGORY High Risk High Risk High Risk        Total Time spent with patient: 15 minutes  Musculoskeletal  Strength & Muscle Tone: within normal limits Gait & Station: normal Patient leans: Right  Psychiatric Specialty Exam  Presentation General Appearance: Fairly Groomed  Eye Contact:Good  Speech:Clear and Coherent  Speech Volume:Normal  Handedness:Right   Mood and Affect  Mood:Depressed  Affect:Congruent   Thought Process  Thought Processes:Coherent  Descriptions of Associations:Intact  Orientation:Full (Time, Place and Person)  Thought Content:WDL  Diagnosis of Schizophrenia or Schizoaffective disorder in past: Yes  Duration of Psychotic Symptoms: Greater than six months  Hallucinations:Hallucinations: Auditory; Tactile; Visual Description of Auditory Hallucinations: "voice that sound demonic, muffled sounds" Description of Visual Hallucinations: "cross, birds, ghost"  Ideas of Reference:None  Suicidal Thoughts:Suicidal Thoughts: Yes, Active  Homicidal Thoughts:Homicidal Thoughts: No   Sensorium  Memory:Immediate Good; Recent Fair; Remote Fair  Judgment:Fair  Insight:Fair   Executive Functions  Concentration:Fair  Attention Span:Fair  Recall:Fair  Fund of Knowledge:Fair  Language:Fair   Psychomotor Activity  Psychomotor Activity:Psychomotor Activity: Normal   Assets  Assets:Communication Skills; Desire for Improvement; Housing; Physical Health   Sleep  Sleep:Sleep: Fair Number of Hours of Sleep: 6   Nutritional Assessment (For OBS and FBC admissions only) Has the patient had a weight loss or gain of 10 pounds or more in the last 3 months?: No Has the patient had a decrease in food intake/or appetite?: Yes Does the patient have dental problems?: No Does the patient have eating  habits or behaviors that may be indicators of an eating disorder including binging or  inducing vomiting?: Yes Has the patient recently lost weight without trying?: 0 Has the patient been eating poorly because of a decreased appetite?: 0 Malnutrition Screening Tool Score: 0    Physical Exam Vitals and nursing note reviewed.  Constitutional:      General: She is not in acute distress.    Appearance: She is well-developed.  HENT:     Head: Normocephalic and atraumatic.  Eyes:     Conjunctiva/sclera: Conjunctivae normal.  Cardiovascular:     Rate and Rhythm: Normal rate.  Pulmonary:     Effort: Pulmonary effort is normal. No respiratory distress.  Abdominal:     Tenderness: There is no abdominal tenderness.  Musculoskeletal:        General: No swelling. Normal range of motion.     Cervical back: Neck supple.  Skin:    General: Skin is warm and dry.     Capillary Refill: Capillary refill takes less than 2 seconds.  Neurological:     Mental Status: She is alert and oriented to person, place, and time.  Psychiatric:        Attention and Perception: Attention and perception normal.        Mood and Affect: Mood is anxious and depressed.        Speech: Speech normal.        Behavior: Behavior normal. Behavior is cooperative.        Thought Content: Thought content includes suicidal ideation.    Review of Systems  Constitutional: Negative.   HENT: Negative.    Eyes: Negative.   Respiratory: Negative.    Cardiovascular: Negative.   Gastrointestinal: Negative.   Genitourinary: Negative.   Musculoskeletal: Negative.   Skin: Negative.   Neurological: Negative.   Endo/Heme/Allergies: Negative.   Psychiatric/Behavioral:  Positive for depression, hallucinations and suicidal ideas.     Blood pressure (!) 130/98, pulse 100, temperature 98.3 F (36.8 C), temperature source Oral, resp. rate 18, last menstrual period 05/02/2022, SpO2 100 %. There is no height or weight on file to calculate BMI.  Past Psychiatric History:    Is the patient at risk to self? Yes  Has  the patient been a risk to self in the past 6 months? No .    Has the patient been a risk to self within the distant past? Yes   Is the patient a risk to others? No   Has the patient been a risk to others in the past 6 months? No   Has the patient been a risk to others within the distant past? No   Past Medical History:  Past Medical History:  Diagnosis Date   Asthma    Autism    GERD (gastroesophageal reflux disease)    Hypertension     Past Surgical History:  Procedure Laterality Date   CYSTECTOMY     DENTAL SURGERY     TONSILLECTOMY      Family History: No family history on file.  Social History:  Social History   Socioeconomic History   Marital status: Single    Spouse name: Not on file   Number of children: Not on file   Years of education: Not on file   Highest education level: Not on file  Occupational History   Not on file  Tobacco Use   Smoking status: Never   Smokeless tobacco: Never  Vaping Use   Vaping Use: Never used  Substance and Sexual Activity   Alcohol use: No   Drug use: No   Sexual activity: Never  Other Topics Concern   Not on file  Social History Narrative   Not on file   Social Determinants of Health   Financial Resource Strain: Not on file  Food Insecurity: Not on file  Transportation Needs: Not on file  Physical Activity: Not on file  Stress: Not on file  Social Connections: Not on file  Intimate Partner Violence: Not on file    SDOH:  SDOH Screenings   Alcohol Screen: Low Risk  (07/11/2017)  Tobacco Use: Low Risk  (05/03/2022)    Last Labs:  Admission on 05/03/2022, Discharged on 05/04/2022  Component Date Value Ref Range Status   Sodium 05/03/2022 140  135 - 145 mmol/L Final   Potassium 05/03/2022 3.9  3.5 - 5.1 mmol/L Final   Chloride 05/03/2022 105  98 - 111 mmol/L Final   CO2 05/03/2022 21 (L)  22 - 32 mmol/L Final   Glucose, Bld 05/03/2022 62 (L)  70 - 99 mg/dL Final   Glucose reference range applies only to samples  taken after fasting for at least 8 hours.   BUN 05/03/2022 21 (H)  6 - 20 mg/dL Final   Creatinine, Ser 05/03/2022 1.13 (H)  0.44 - 1.00 mg/dL Final   Calcium 77/41/2878 9.5  8.9 - 10.3 mg/dL Final   GFR, Estimated 05/03/2022 >60  >60 mL/min Final   Comment: (NOTE) Calculated using the CKD-EPI Creatinine Equation (2021)    Anion gap 05/03/2022 14  5 - 15 Final   Performed at [facility], [address], [place], [place] [address]   WBC 05/03/2022 7.2  4.0 - 10.5 K/uL Final   RBC 05/03/2022 5.44 (H)  3.87 - 5.11 MIL/uL Final   Hemoglobin 05/03/2022 16.4 (H)  12.0 - 15.0 g/dL Final   HCT 09/47/0962 48.4 (H)  36.0 - 46.0 % Final   MCV 05/03/2022 89.0  80.0 - 100.0 fL Final   MCH 05/03/2022 30.1  26.0 - 34.0 pg Final   MCHC 05/03/2022 33.9  30.0 - 36.0 g/dL Final   RDW 83/66/2947 11.9  11.5 - 15.5 % Final   Platelets 05/03/2022 263  150 - 400 K/uL Final   nRBC 05/03/2022 0.0  0.0 - 0.2 % Final   Performed at [facility], [address] [place], [place] [address]   Troponin I (High Sensitivity) 05/03/2022 3  <18 ng/L Final   Comment: (NOTE) Elevated high sensitivity troponin I (hsTnI) values and significant  changes across serial measurements may suggest ACS but many other  chronic and acute conditions are known to elevate hsTnI results.  Refer to the "Links" section for chest pain algorithms and additional  guidance. Performed at [facility], [address], [place], [place] [address]    hCG, Conley Rolls, Quant, S 05/03/2022 <1  <5 mIU/mL Final   Comment:          GEST. AGE      CONC.  (mIU/mL)   <=1 WEEK        5 - 50     2 WEEKS       50 - 500     3 WEEKS       100 - 10,000     4 WEEKS     1,000 - 30,000     5 WEEKS     3,500 - 115,000   6-8 WEEKS  12,000 - 270,000    12 WEEKS     15,000 - 220,000        FEMALE AND NON-PREGNANT FEMALE:     LESS THAN 5 mIU/mL Performed at [facility], [address] [place], [place] [address]    D-Dimer, Sharene Butters 05/03/2022 <0.27  0.00 - 0.50 ug/mL-FEU Final   Comment: (NOTE) At the manufacturer cut-off value of 0.5 g/mL FEU, this assay has a negative predictive value of 95-100%.This assay is intended for use in conjunction with a clinical pretest probability (PTP) assessment model to exclude pulmonary embolism (PE) and deep venous thrombosis (DVT) in outpatients suspected of PE or DVT. Results should be correlated with clinical presentation. Performed at [facility], [address] [place], [place] [address]    Glucose-Capillary 05/03/2022 106 (H)  70 - 99 mg/dL Final   Glucose reference range applies only to samples taken after fasting for at least 8 hours.   SARS Coronavirus 2 by RT PCR 05/03/2022 NEGATIVE  NEGATIVE Final   Comment: (NOTE) SARS-CoV-2 target nucleic acids are NOT DETECTED.  The SARS-CoV-2 RNA is generally detectable in upper respiratory specimens during the acute phase of infection. The lowest concentration of SARS-CoV-2 viral copies this assay can detect is 138 copies/mL. A negative result does not preclude SARS-Cov-2 infection and should not be used as the sole basis for treatment or other patient management decisions. A negative result may occur with  improper specimen collection/handling, submission of specimen other than nasopharyngeal swab, presence of viral mutation(s) within the areas targeted by this assay, and inadequate number of viral copies(<138 copies/mL). A negative result must be combined with clinical observations, patient history, and epidemiological information. The expected result is Negative.  Fact Sheet for Patients:  BloggerCourse.com  Fact Sheet for Healthcare Providers:  SeriousBroker.it  This test is no                          t yet approved or cleared by the [place] FDA and  has been authorized for detection  and/or diagnosis of SARS-CoV-2 by FDA under an Emergency Use Authorization (EUA). This EUA will remain  in effect (meaning this test can be used) for the duration of the COVID-19 declaration under Section 564(b)(1) of the Act, 21 U.S.C.section 360bbb-3(b)(1), unless the authorization is terminated  or revoked sooner.       Influenza A by PCR 05/03/2022 NEGATIVE  NEGATIVE Final   Influenza B by PCR 05/03/2022 NEGATIVE  NEGATIVE Final   Comment: (NOTE) The Xpert Xpress SARS-CoV-2/FLU/RSV plus assay is intended as an aid in the diagnosis of influenza from Nasopharyngeal swab specimens and should not be used as a sole basis for treatment. Nasal washings and aspirates are unacceptable for Xpert Xpress SARS-CoV-2/FLU/RSV testing.  Fact Sheet for Patients: BloggerCourse.com  Fact Sheet for Healthcare Providers: SeriousBroker.it  This test is not yet approved or cleared by the [place] FDA and has been authorized for detection and/or diagnosis of SARS-CoV-2 by FDA under an Emergency Use Authorization (EUA). This EUA will remain in effect (meaning this test can be used) for the duration of the COVID-19 declaration under Section 564(b)(1) of the Act, 21 U.S.C. section 360bbb-3(b)(1), unless the authorization is terminated or revoked.  Performed at [facility], [address], [place], [place] [address]   Admission on 12/27/2021, Discharged on 12/28/2021  Component Date Value Ref Range Status   SARS Coronavirus 2 by RT  PCR 12/27/2021 NEGATIVE  NEGATIVE Final   Comment: (NOTE) SARS-CoV-2 target nucleic acids are NOT DETECTED.  The SARS-CoV-2 RNA is generally detectable in upper respiratory specimens during the acute phase of infection. The lowest concentration of SARS-CoV-2 viral copies this assay can detect is 138 copies/mL. A negative result does not preclude SARS-Cov-2 infection and should not be used as the  sole basis for treatment or other patient management decisions. A negative result may occur with  improper specimen collection/handling, submission of specimen other than nasopharyngeal swab, presence of viral mutation(s) within the areas targeted by this assay, and inadequate number of viral copies(<138 copies/mL). A negative result must be combined with clinical observations, patient history, and epidemiological information. The expected result is Negative.  Fact Sheet for Patients:  BloggerCourse.com  Fact Sheet for Healthcare Providers:  SeriousBroker.it  This test is no                          t yet approved or cleared by the [place] FDA and  has been authorized for detection and/or diagnosis of SARS-CoV-2 by FDA under an Emergency Use Authorization (EUA). This EUA will remain  in effect (meaning this test can be used) for the duration of the COVID-19 declaration under Section 564(b)(1) of the Act, 21 U.S.C.section 360bbb-3(b)(1), unless the authorization is terminated  or revoked sooner.       Influenza A by PCR 12/27/2021 NEGATIVE  NEGATIVE Final   Influenza B by PCR 12/27/2021 NEGATIVE  NEGATIVE Final   Comment: (NOTE) The Xpert Xpress SARS-CoV-2/FLU/RSV plus assay is intended as an aid in the diagnosis of influenza from Nasopharyngeal swab specimens and should not be used as a sole basis for treatment. Nasal washings and aspirates are unacceptable for Xpert Xpress SARS-CoV-2/FLU/RSV testing.  Fact Sheet for Patients: BloggerCourse.com  Fact Sheet for Healthcare Providers: SeriousBroker.it  This test is not yet approved or cleared by the [place] FDA and has been authorized for detection and/or diagnosis of SARS-CoV-2 by FDA under an Emergency Use Authorization (EUA). This EUA will remain in effect (meaning this test can be used) for the duration of  the COVID-19 declaration under Section 564(b)(1) of the Act, 21 U.S.C. section 360bbb-3(b)(1), unless the authorization is terminated or revoked.  Performed at [facility], [address], [place], [place] [address]    I-stat hCG, quantitative 12/27/2021 <5.0  <5 mIU/mL Final   Comment 3 12/27/2021          Final   Comment:   GEST. AGE      CONC.  (mIU/mL)   <=1 WEEK        5 - 50     2 WEEKS       50 - 500     3 WEEKS       100 - 10,000     4 WEEKS     1,000 - 30,000        FEMALE AND NON-PREGNANT FEMALE:     LESS THAN 5 mIU/mL    Acetaminophen (Tylenol), Serum 12/27/2021 <10 (L)  10 - 30 ug/mL Final   Comment: (NOTE) Therapeutic concentrations vary significantly. A range of 10-30 ug/mL  may be an effective concentration for many patients. However, some  are best treated at concentrations outside of this range. Acetaminophen concentrations >150 ug/mL at 4 hours after ingestion  and >50 ug/mL at 12 hours after ingestion are often associated with  toxic reactions.  Performed at [facility], [address], [place], [place] [address]    Salicylate Lvl 12/27/2021 <7.0 (L)  7.0 - 30.0 mg/dL Final   Performed at [facility], [address], [place], [place] [address]   Opiates 12/27/2021 NONE DETECTED  NONE DETECTED Final   Cocaine 12/27/2021 NONE DETECTED  NONE DETECTED Final   Benzodiazepines 12/27/2021 NONE DETECTED  NONE DETECTED Final   Amphetamines 12/27/2021 NONE DETECTED  NONE DETECTED Final   Tetrahydrocannabinol 12/27/2021 NONE DETECTED  NONE DETECTED Final   Barbiturates 12/27/2021 NONE DETECTED  NONE DETECTED Final   Comment: (NOTE) DRUG SCREEN FOR MEDICAL PURPOSES ONLY.  IF CONFIRMATION IS NEEDED FOR ANY PURPOSE, NOTIFY LAB WITHIN 5 DAYS.  LOWEST DETECTABLE LIMITS FOR URINE DRUG SCREEN Drug Class                     Cutoff (ng/mL) Amphetamine and metabolites    1000 Barbiturate and metabolites    200 Benzodiazepine                  200 Tricyclics and metabolites     300 Opiates and metabolites        300 Cocaine and metabolites        300 THC                            50 Performed at [facility], [address] [place], [place] [address]    Alcohol, Ethyl (B) 12/27/2021 <10  <10 mg/dL Final   Comment: (NOTE) Lowest detectable limit for serum alcohol is 10 mg/dL.  For medical purposes only. Performed at [facility], [address] [place], [place] [address]    Troponin I (High Sensitivity) 12/27/2021 3  <18 ng/L Final   Comment: (NOTE) Elevated high sensitivity troponin I (hsTnI) values and significant  changes across serial measurements may suggest ACS but many other  chronic and acute conditions are known to elevate hsTnI results.  Refer to the "Links" section for chest pain algorithms and additional  guidance. Performed at [facility], [address] [place], [place] [address]    Troponin I (High Sensitivity) 12/27/2021 3  <18 ng/L Final   Comment: (NOTE) Elevated high sensitivity troponin I (hsTnI) values and significant  changes across serial measurements may suggest ACS but many other  chronic and acute conditions are known to elevate hsTnI results.  Refer to the "Links" section for chest pain algorithms and additional  guidance. Performed at [facility], [address], [place], [place] [address]     Allergies: Fluoxetine, Lisinopril, and Risperdal [risperidone]  PTA Medications: (Not in a hospital admission)   Medical Decision Making  Patient will be admitted to [facility] for continuous assessment with follow-up by psychiatry.     Recommendations  Based on my evaluation the patient does not appear to have an emergency medical condition.  Maricela Bo, NP 05/04/22  2:49 AM

## 2022-05-10 IMAGING — CR DG CHEST 2V
2 series · 2 of 2 positions shown · non-contrast
Comparison: 05/20/2018

CLINICAL DATA: Mid chest discomfort for 2 weeks

EXAM:
CHEST - 2 VIEW

[chest pa]
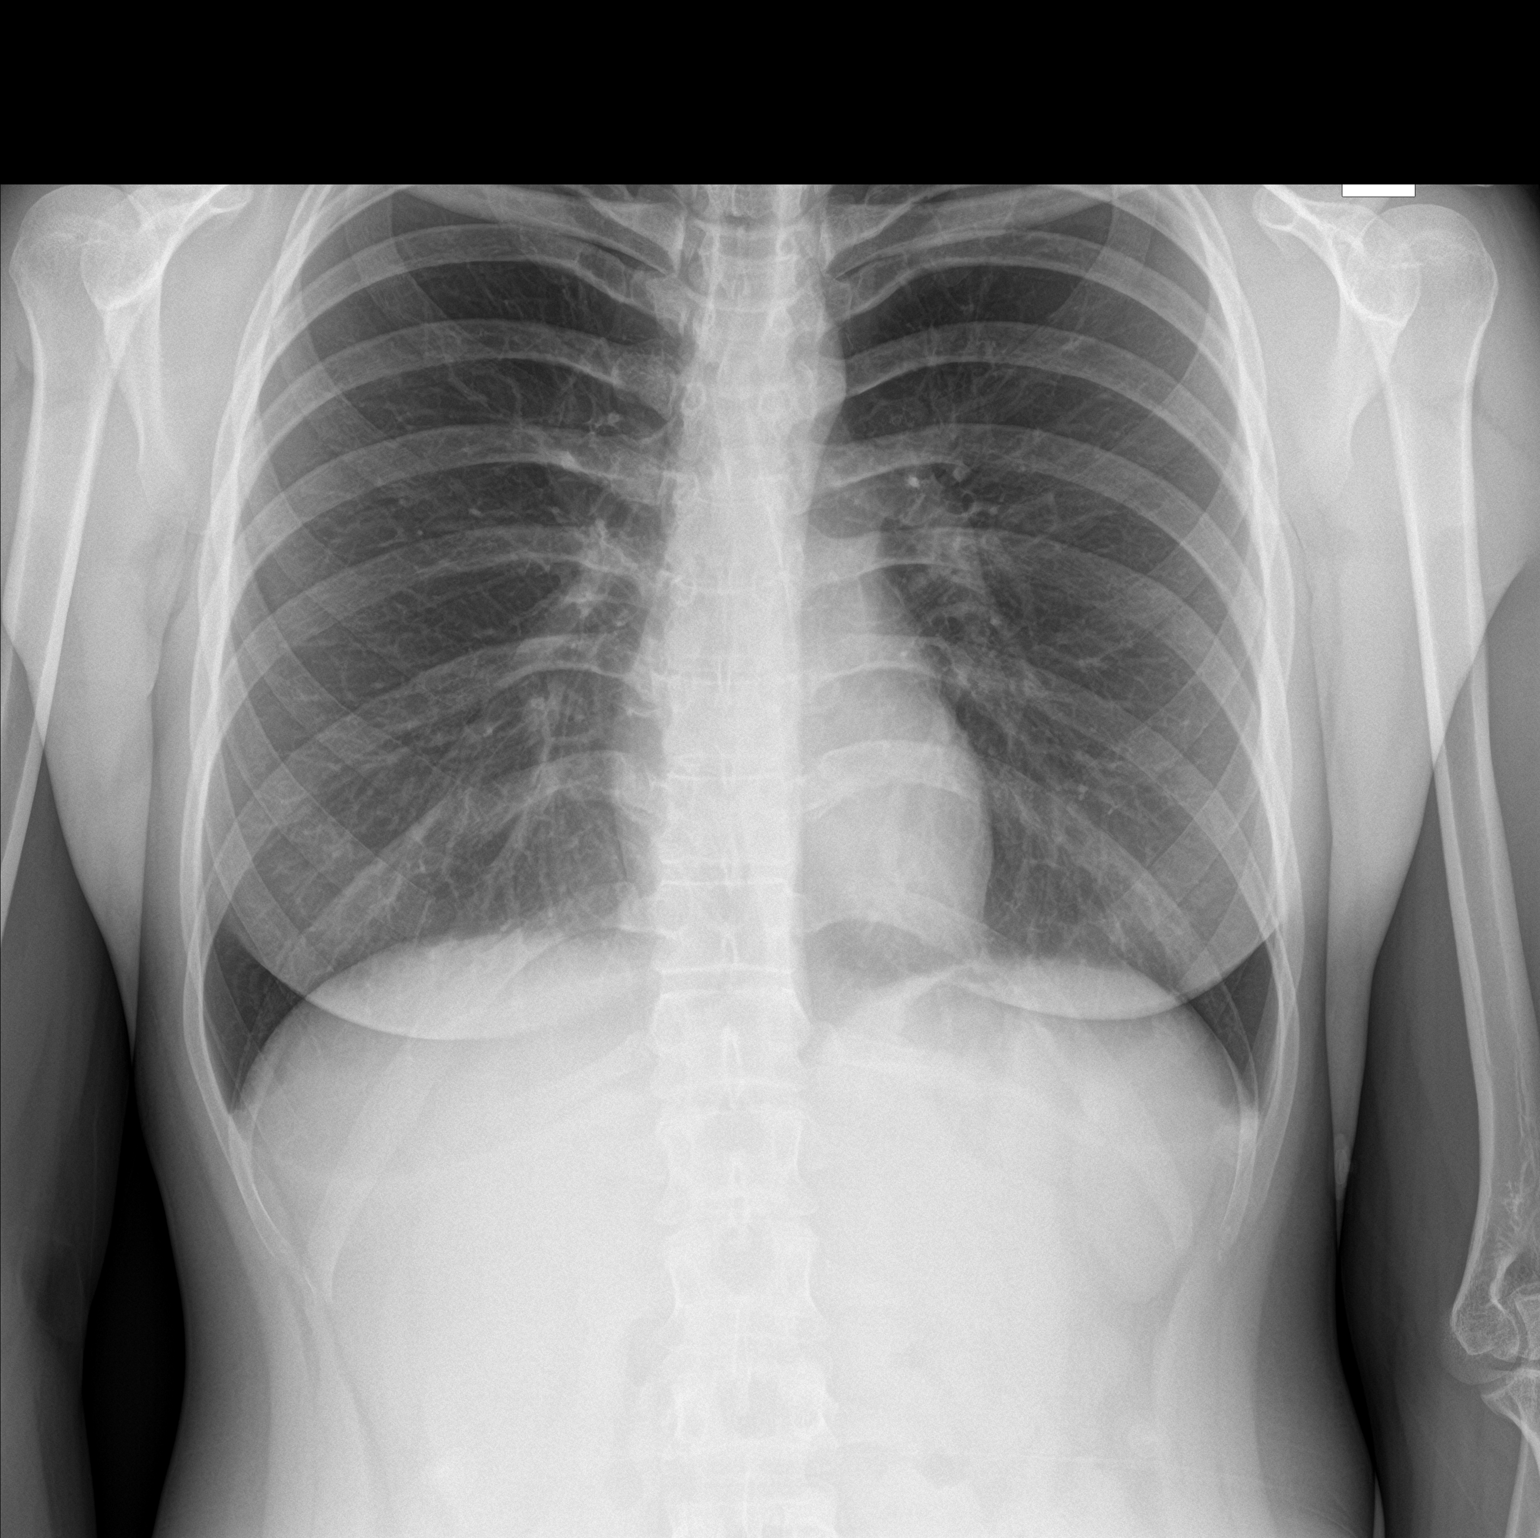

[chest lat]
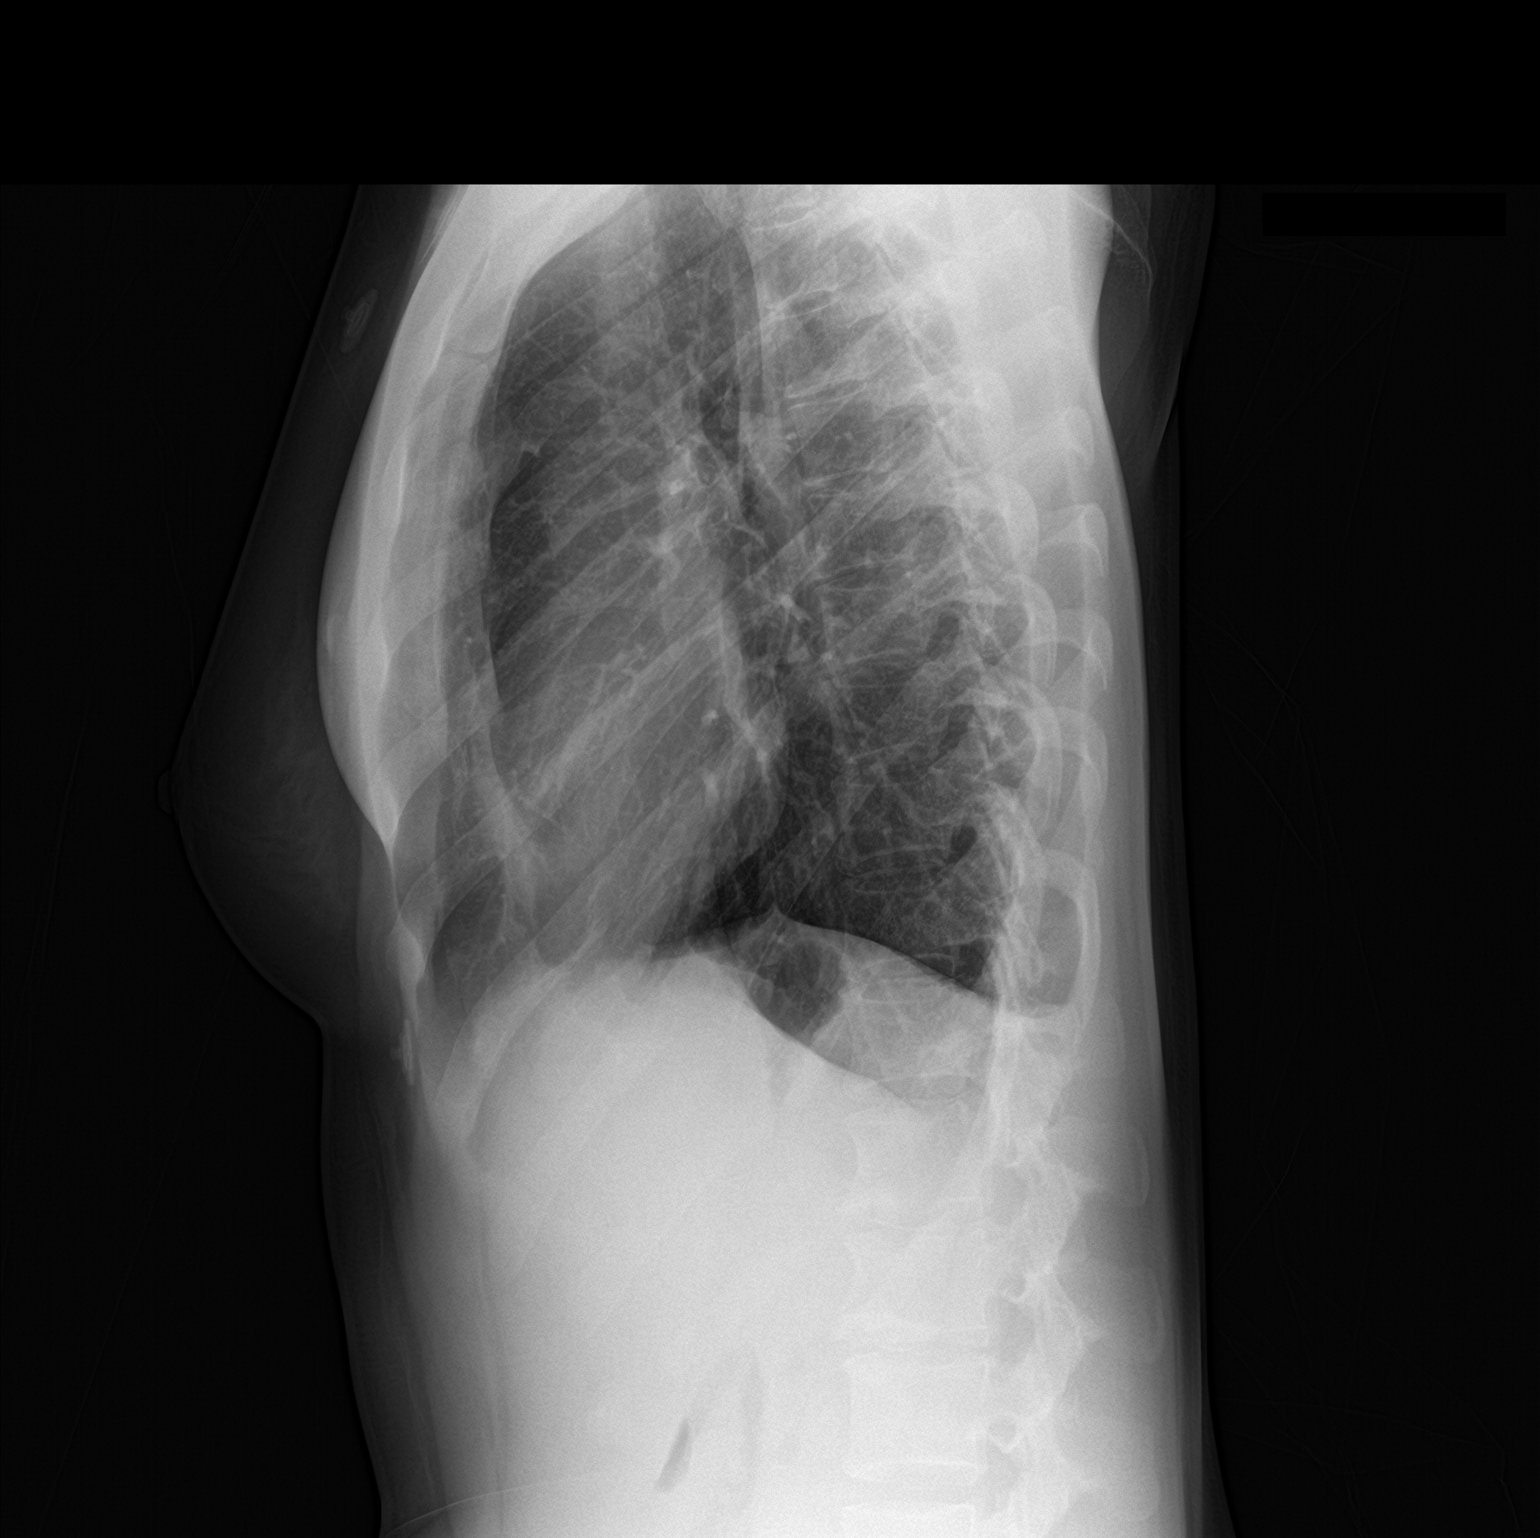

[2 of 2 positions shown; findings below may reference images not displayed]

FINDINGS: The heart size and mediastinal contours are within normal limits.
Both lungs are clear. The visualized skeletal structures are
unremarkable.
IMPRESSION: No active cardiopulmonary disease.

## 2022-08-12 ENCOUNTER — Emergency Department (EMERGENCY_DEPARTMENT_HOSPITAL)
Admission: EM | Admit: 2022-08-12 | Discharge: 2022-08-16 | Disposition: A | Discharge status: Other Acute Inpatient Hospital | Payer: Medicare Other | Source: Home / Self Care | Attending: Emergency Medicine | Admitting: Emergency Medicine

## 2022-08-12 ENCOUNTER — Encounter (HOSPITAL_COMMUNITY): Payer: Self-pay

## 2022-08-12 ENCOUNTER — Other Ambulatory Visit: Payer: Self-pay

## 2022-08-12 DIAGNOSIS — F315 Bipolar disorder, current episode depressed, severe, with psychotic features: Secondary | ICD-10-CM | POA: Diagnosis present

## 2022-08-12 DIAGNOSIS — Z1339 Encounter for screening examination for other mental health and behavioral disorders: Secondary | ICD-10-CM | POA: Insufficient documentation

## 2022-08-12 DIAGNOSIS — Z1152 Encounter for screening for COVID-19: Secondary | ICD-10-CM | POA: Insufficient documentation

## 2022-08-12 DIAGNOSIS — R440 Auditory hallucinations: Secondary | ICD-10-CM | POA: Insufficient documentation

## 2022-08-12 DIAGNOSIS — T1491XA Suicide attempt, initial encounter: Secondary | ICD-10-CM

## 2022-08-12 DIAGNOSIS — F840 Autistic disorder: Secondary | ICD-10-CM | POA: Insufficient documentation

## 2022-08-12 DIAGNOSIS — R0789 Other chest pain: Secondary | ICD-10-CM | POA: Diagnosis not present

## 2022-08-12 DIAGNOSIS — R441 Visual hallucinations: Secondary | ICD-10-CM | POA: Insufficient documentation

## 2022-08-12 DIAGNOSIS — R45851 Suicidal ideations: Secondary | ICD-10-CM

## 2022-08-12 LAB — CBC WITH DIFFERENTIAL/PLATELET
Abs Immature Granulocytes: 0.01 10*3/uL (ref 0.00–0.07)
Basophils Absolute: 0 10*3/uL (ref 0.0–0.1)
Basophils Relative: 1 %
Eosinophils Absolute: 0.1 10*3/uL (ref 0.0–0.5)
Eosinophils Relative: 1 %
HCT: 40.3 % (ref 36.0–46.0)
Hemoglobin: 13.3 g/dL (ref 12.0–15.0)
Immature Granulocytes: 0 %
Lymphocytes Relative: 28 %
Lymphs Abs: 1.9 10*3/uL (ref 0.7–4.0)
MCH: 30 pg (ref 26.0–34.0)
MCHC: 33 g/dL (ref 30.0–36.0)
MCV: 90.8 fL (ref 80.0–100.0)
Monocytes Absolute: 0.4 10*3/uL (ref 0.1–1.0)
Monocytes Relative: 6 %
Neutro Abs: 4.2 10*3/uL (ref 1.7–7.7)
Neutrophils Relative %: 64 %
Platelets: 244 10*3/uL (ref 150–400)
RBC: 4.44 MIL/uL (ref 3.87–5.11)
RDW: 12.1 % (ref 11.5–15.5)
WBC: 6.6 10*3/uL (ref 4.0–10.5)
nRBC: 0 % (ref 0.0–0.2)

## 2022-08-12 LAB — COMPREHENSIVE METABOLIC PANEL
ALT: 14 U/L (ref 0–44)
AST: 13 U/L — ABNORMAL LOW (ref 15–41)
Albumin: 4.3 g/dL (ref 3.5–5.0)
Alkaline Phosphatase: 51 U/L (ref 38–126)
Anion gap: 7 (ref 5–15)
BUN: 10 mg/dL (ref 6–20)
CO2: 24 mmol/L (ref 22–32)
Calcium: 9.1 mg/dL (ref 8.9–10.3)
Chloride: 108 mmol/L (ref 98–111)
Creatinine, Ser: 0.72 mg/dL (ref 0.44–1.00)
GFR, Estimated: >60 mL/min (ref >60)
Glucose, Bld: 89 mg/dL (ref 70–99)
Potassium: 4 mmol/L (ref 3.5–5.1)
Sodium: 139 mmol/L (ref 135–145)
Total Bilirubin: 0.4 mg/dL (ref 0.3–1.2)
Total Protein: 7.3 g/dL (ref 6.5–8.1)

## 2022-08-12 LAB — RAPID URINE DRUG SCREEN, HOSP PERFORMED
Amphetamines: NOT DETECTED
Barbiturates: NOT DETECTED
Benzodiazepines: NOT DETECTED
Cocaine: NOT DETECTED
Opiates: NOT DETECTED
Tetrahydrocannabinol: NOT DETECTED

## 2022-08-12 LAB — ACETAMINOPHEN LEVEL: Acetaminophen (Tylenol), Serum: <10 ug/mL — ABNORMAL LOW (ref 10–30)

## 2022-08-12 LAB — PREGNANCY, URINE: Preg Test, Ur: NEGATIVE

## 2022-08-12 LAB — ETHANOL: Alcohol, Ethyl (B): <10 mg/dL (ref <10)

## 2022-08-12 LAB — SALICYLATE LEVEL: Salicylate Lvl: <7 mg/dL — ABNORMAL LOW (ref 7.0–30.0)

## 2022-08-12 MED ORDER — QUETIAPINE FUMARATE 100 MG PO TABS
200.0000 mg | ORAL_TABLET | Freq: Every day | ORAL | Status: DC
  Administered 2022-08-13 – 2022-08-14 (×2): 200 mg via ORAL
  Filled 2022-08-12 (×2): qty 2

## 2022-08-12 MED ORDER — LAMOTRIGINE 25 MG PO TABS
25.0000 mg | ORAL_TABLET | Freq: Every day | ORAL | Status: DC
  Administered 2022-08-13 – 2022-08-16 (×4): 25 mg via ORAL
  Filled 2022-08-12 (×4): qty 1

## 2022-08-12 NOTE — ED Notes (Signed)
Group home LPN tabitha would like to be called @ [phone]

## 2022-08-12 NOTE — ED Provider Triage Note (Addendum)
Emergency Medicine Provider Triage Evaluation Note  Brittany Palmer , a 26 y.o. female  was evaluated in triage.  Pt complains of Se[place] ideations and hallucinations.  Patient says she has been really depressed since Thanksgiving and is wanted to kill herself.  She says she wants to slit her throat.  She has started cutting herself and has marks on her right wrist, abdomen, and leg.  She is with her caregiver from her group home who states she has frequently claimed that she was suicidal.  She also has claimed that she has hallucinations of any tactile, auditory, and visual.  Caregivers concerned that she has been misdiagnosed and is actually bipolar schizophrenic.  Review of Systems  Positive:  Negative:   Physical Exam  BP (!) 161/102 (BP Location: Left Arm)   Pulse (!) 106   Temp 98.1 F (36.7 C) (Oral)   Resp 18   SpO2 96%  Gen:   Awake, no distress   Resp:  Normal effort  MSK:   Moves extremities without difficulty  Other:  Cuts on wrist and abdomen are shallow, do not appear infected  Medical Decision Making  Medically screening exam initiated at 2:33 PM.  Appropriate orders placed.  Brittany Palmer was informed that the remainder of the evaluation will be completed by another provider, this initial triage assessment does not replace that evaluation, and the importance of remaining in the ED until their evaluation is complete.     Claudie Leach, PA-C 08/12/22 1434    Claudie Leach, [place] 08/12/22 1434

## 2022-08-12 NOTE — ED Triage Notes (Signed)
Patient arrives with her group home staff member. Patient tried to kill herself with glass from a mirror. Cut her right wrist, stomach, left thigh. Tetanus unknown. Has been feeling suicidal since thanksgiving.

## 2022-08-12 NOTE — Consult Note (Cosign Needed)
BH ED ASSESSMENT   Reason for Consult:  Psychiatry evaluation Referring Physician:  ER Physician Patient Identification: Brittany Palmer MRN:  [id number] ED Chief Complaint: Bipolar I disorder, current or most recent episode depressed, with psychotic features (HCC)  Diagnosis:  Principal Problem:   Bipolar I disorder, current or most recent episode depressed, with psychotic features [facility](HCC) Active Problems:   Suicidal ideations   ED Assessment Time Calculation: Start Time: 1935 Stop Time: 2000 Total Time in Minutes (Assessment Completion): 25   Subjective:   Brittany Palmer is a 26 y.o. female patient admitted with hx significant for .schizoaffective disorder, depressed type, suicide ideation, Autism Spectrum Disorder, MDD and Schizophrenic disorder brought to the ER by her group home staff member.  Patient tried to cut her right wrist, stomach and bilateral thighs on Thursday and again on Saturday.  She used a glass from a mirror and cut herself superficially.  Patient reports she has been feeling suicidal since Thanks giving period.  HPI:  Patient was seen in the conference room for evaluation with group home staff.  Patient admitted that she cut herself last Thursday and again on Saturday and did not tell anybody at the group home until today she told one staff member at the Day program she goes to.  Patient reports that she has been feeling suicidal since thanks giving period.  She  strongly believes that her parents does not care about her and they are too judgmental of her.  Patient states her parents does not like her because she is "too gothic" for them.  She also says that the voices in her head always tells her to kill herself.  Patient rated Depression 10/10 with 10 being severe depression.  She reports her triggers are that her parents do not like her.  Patient sees a visiting Psychiatrist in the group home but is not engaged in therapy.  She reports her sleeps varies from day to day,  from very good sleep to barely sleeping three hours.  She reports good appetite and actually have been over eating lately.  Patient reports previous inpatient Psychiatry hospitalization at [facility] where she spent 3 weeks. Patient is well groomed and her speech is normal tone and volume.  She is alert and oriented x4 and she is compliant with her medications at the group home.  Patient agrees that she needs therapy and we will will recommend and make referral for therapy on discharge.  We will reevaluate in am and determine appropriate disposition.  She remains suicidal with plan to slit her throat she says.  She denies HI/VH.  We will offer Seroquel and Lamictal.  Past Psychiatric History: Hx significant for .schizoaffective disorder, depressed type, suicide ideation, Autism Spectrum Disorder, MDD and Schizophrenic disorder.  Reports one admission at [facility] that lasted three weeks  Risk to Self or Others: Is the patient at risk to self? Yes Has the patient been a risk to self in the past 6 months? Yes Has the patient been a risk to self within the distant past? Yes Is the patient a risk to others? No Has the patient been a risk to others in the past 6 months? No Has the patient been a risk to others within the distant past? No  [place] Scale:  Flowsheet Row ED from [facility]  C-SSRS RISK  CATEGORY High Risk High Risk High Risk       AIMS:  , , ,  ,   ASAM:    Substance Abuse:     Past Medical History:  Past Medical History:  Diagnosis Date   Asthma    Autism    GERD (gastroesophageal reflux disease)    Hypertension     Past Surgical History:  Procedure Laterality Date   CYSTECTOMY     DENTAL SURGERY     TONSILLECTOMY     Family History: History reviewed. No pertinent family  history. Family Psychiatric  History: unknown Social History:  Social History   Substance and Sexual Activity  Alcohol Use No     Social History   Substance and Sexual Activity  Drug Use No    Social History   Socioeconomic History   Marital status: Single    Spouse name: Not on file   Number of children: Not on file   Years of education: Not on file   Highest education level: Not on file  Occupational History   Not on file  Tobacco Use   Smoking status: Never   Smokeless tobacco: Never  Vaping Use   Vaping Use: Never used  Substance and Sexual Activity   Alcohol use: No   Drug use: No   Sexual activity: Never  Other Topics Concern   Not on file  Social History Narrative   Not on file   Social Determinants of Health   Financial Resource Strain: Not on file  Food Insecurity: Not on file  Transportation Needs: Not on file  Physical Activity: Not on file  Stress: Not on file  Social Connections: Not on file   Additional Social History:    Allergies:   Allergies  Allergen Reactions   Fluoxetine Other (See Comments) and Rash    Patient reports allergy and rash across neck when trying this medication.   Lisinopril Cough   Risperdal [Risperidone] Other (See Comments)    Twitching of mouth     Labs:  Results for orders placed or performed during the hospital encounter of 08/12/22 (from the past 48 hour(s))  Comprehensive metabolic panel     Status: Abnormal   Collection Time: 08/12/22  3:37 PM  Result Value Ref Range   Sodium 139 135 - 145 mmol/L   Potassium 4.0 3.5 - 5.1 mmol/L   Chloride 108 98 - 111 mmol/L   CO2 24 22 - 32 mmol/L   Glucose, Bld 89 70 - 99 mg/dL    Comment: Glucose reference range applies only to samples taken after fasting for at least 8 hours.   BUN 10 6 - 20 mg/dL   Creatinine, Ser 9.32 0.44 - 1.00 mg/dL   Calcium 9.1 8.9 - 35.5 mg/dL   Total Protein 7.3 6.5 - 8.1 g/dL   Albumin 4.3 3.5 - 5.0 g/dL   AST 13 (L) 15 - 41 U/L   ALT  14 0 - 44 U/L   Alkaline Phosphatase 51 38 - 126 U/L   Total Bilirubin 0.4 0.3 - 1.2 mg/dL   GFR, Estimated >73 >22 mL/min    Comment: (NOTE) Calculated using the CKD-EPI Creatinine Equation (2021)    Anion gap 7 5 - 15    Comment: Performed at [facility], [address], [place], [place] [address]  Ethanol     Status: None   Collection Time: 08/12/22  3:37 PM  Result Value Ref Range   Alcohol, Ethyl (B) <  10 <10 mg/dL    Comment: (NOTE) Lowest detectable limit for serum alcohol is 10 mg/dL.  For medical purposes only. Performed at [facility], [address] [place], [place] [address]   Urine rapid drug screen (hosp performed)     Status: None   Collection Time: 08/12/22  3:37 PM  Result Value Ref Range   Opiates NONE DETECTED NONE DETECTED   Cocaine NONE DETECTED NONE DETECTED   Benzodiazepines NONE DETECTED NONE DETECTED   Amphetamines NONE DETECTED NONE DETECTED   Tetrahydrocannabinol NONE DETECTED NONE DETECTED   Barbiturates NONE DETECTED NONE DETECTED    Comment: (NOTE) DRUG SCREEN FOR MEDICAL PURPOSES ONLY.  IF CONFIRMATION IS NEEDED FOR ANY PURPOSE, NOTIFY LAB WITHIN 5 DAYS.  LOWEST DETECTABLE LIMITS FOR URINE DRUG SCREEN Drug Class                     Cutoff (ng/mL) Amphetamine and metabolites    1000 Barbiturate and metabolites    200 Benzodiazepine                 200 Opiates and metabolites        300 Cocaine and metabolites        300 THC                            50 Performed at [facility], [address], [place], [place] [address]   CBC with Diff     Status: None   Collection Time: 08/12/22  3:37 PM  Result Value Ref Range   WBC 6.6 4.0 - 10.5 K/uL   RBC 4.44 3.87 - 5.11 MIL/uL   Hemoglobin 13.3 12.0 - 15.0 g/dL   HCT 63.0 16.0 - 10.9 %   MCV 90.8 80.0 - 100.0 fL   MCH 30.0 26.0 - 34.0 pg   MCHC 33.0 30.0 - 36.0 g/dL   RDW 32.3 55.7 - 32.2 %   Platelets 244 150 - 400 K/uL   nRBC  0.0 0.0 - 0.2 %   Neutrophils Relative % 64 %   Neutro Abs 4.2 1.7 - 7.7 K/uL   Lymphocytes Relative 28 %   Lymphs Abs 1.9 0.7 - 4.0 K/uL   Monocytes Relative 6 %   Monocytes Absolute 0.4 0.1 - 1.0 K/uL   Eosinophils Relative 1 %   Eosinophils Absolute 0.1 0.0 - 0.5 K/uL   Basophils Relative 1 %   Basophils Absolute 0.0 0.0 - 0.1 K/uL   Immature Granulocytes 0 %   Abs Immature Granulocytes 0.01 0.00 - 0.07 K/uL    Comment: Performed at [facility], [address] [place], [place] [address]  Acetaminophen level     Status: Abnormal   Collection Time: 08/12/22  3:37 PM  Result Value Ref Range   Acetaminophen (Tylenol), Serum <10 (L) 10 - 30 ug/mL    Comment: (NOTE) Therapeutic concentrations vary significantly. A range of 10-30 ug/mL  may be an effective concentration for many patients. However, some  are best treated at concentrations outside of this range. Acetaminophen concentrations >150 ug/mL at 4 hours after ingestion  and >50 ug/mL at 12 hours after ingestion are often associated with  toxic reactions.  Performed at [facility], [address], [place], [place] [address]   Salicylate level     Status: Abnormal   Collection Time: 08/12/22  3:37 PM  Result Value Ref Range  Salicylate Lvl <7.0 (L) 7.0 - 30.0 mg/dL    Comment: Performed at [facility], [address], [place], [place] [address]  Pregnancy, urine     Status: None   Collection Time: 08/12/22  3:40 PM  Result Value Ref Range   Preg Test, Ur NEGATIVE NEGATIVE    Comment:        THE SENSITIVITY OF THIS METHODOLOGY IS >20 mIU/mL. Performed at [facility], [address] [place], [place] [address]     Current Facility-Administered Medications  Medication Dose Route Frequency Provider Last Rate Last Admin   [START ON 08/13/2022] lamoTRIgine (LAMICTAL) tablet 25 mg  25 mg Oral Daily Ariana Juul C, NP       QUEtiapine  (SEROQUEL) tablet 200 mg  200 mg Oral QHS Dahlia Byes C, NP       Current Outpatient Medications  Medication Sig Dispense Refill   albuterol (PROVENTIL HFA;VENTOLIN HFA) 108 (90 Base) MCG/ACT inhaler Inhale 2 puffs into the lungs every 6 (six) hours as needed for wheezing or shortness of breath.      ARIPiprazole (ABILIFY) 15 MG tablet Take 15 mg by mouth daily.     budesonide-formoterol (SYMBICORT) 160-4.5 MCG/ACT inhaler Inhale 2 puffs into the lungs 2 (two) times daily.     Cholecalciferol (VITAMIN D3) 50 MCG (2000 UT) capsule Take 4,000 Units by mouth daily.     lamoTRIgine (LAMICTAL) 25 MG tablet Take 25 mg by mouth daily.     loratadine (CLARITIN) 10 MG tablet Take 10 mg by mouth daily.     mometasone-formoterol (DULERA) 100-5 MCG/ACT AERO Inhale 2 puffs into the lungs 2 (two) times daily. (Patient not taking: Reported on 12/16/2019) 1 Inhaler 1   QUEtiapine (SEROQUEL) 400 MG tablet Take 400 mg by mouth at bedtime.     verapamil (CALAN) 40 MG tablet Take 40 mg by mouth 2 (two) times daily.      Musculoskeletal: Strength & Muscle Tone: within normal limits Gait & Station: normal Patient leans: Front   Psychiatric Specialty Exam: Presentation  General Appearance:  Casual; Fairly Groomed  Eye Contact: Good  Speech: Clear and Coherent; Normal Rate  Speech Volume: Normal  Handedness: Right   Mood and Affect  Mood: Depressed  Affect: Congruent; Depressed   Thought Process  Thought Processes: Coherent; Goal Directed; Linear  Descriptions of Associations:Intact  Orientation:Full (Time, Place and Person)  Thought Content:Logical  History of Schizophrenia/Schizoaffective disorder:Yes  Duration of Psychotic Symptoms:Greater than six months  Hallucinations:Hallucinations: Auditory Description of Auditory Hallucinations: Voice in her head telling her to hurt herself.  Ideas of Reference:None  Suicidal Thoughts:Suicidal Thoughts: Yes, Active SI Active  Intent and/or Plan: With Intent; With Plan; With Means to Carry Out; With Access to Means  Homicidal Thoughts:Homicidal Thoughts: No   Sensorium  Memory: Immediate Good; Recent Good; Remote Good  Judgment: Intact  Insight: Present   Executive Functions  Concentration: Good  Attention Span: Good  Recall: Good  Fund of Knowledge: Good  Language: Good   Psychomotor Activity  Psychomotor Activity: Psychomotor Activity: Normal   Assets  Assets: Communication Skills; Housing; Physical Health    Sleep  Sleep: Sleep: Good   Physical Exam: Physical Exam Vitals and nursing note reviewed.  HENT:     Head: Normocephalic and atraumatic.     Nose: Nose normal.  Cardiovascular:     Rate and Rhythm: Normal rate.  Pulmonary:     Effort: Pulmonary effort is normal.  Musculoskeletal:  General: Normal range of motion.     Cervical back: Normal range of motion.  Skin:    General: Skin is warm and dry.     Comments: Superficial cuts to right wrist, stomach and both thighs.  Neurological:     Mental Status: She is oriented to person, place, and time.    Review of Systems  Constitutional: Negative.   HENT: Negative.    Eyes: Negative.   Respiratory: Negative.    Cardiovascular: Negative.   Gastrointestinal: Negative.   Genitourinary: Negative.   Musculoskeletal: Negative.   Skin:        Superficial cuts to right wrist, stomach and both thighs.  Neurological: Negative.   Endo/Heme/Allergies: Negative.   Psychiatric/Behavioral:  Positive for depression, hallucinations and suicidal ideas.    Blood pressure (!) 148/110, pulse 100, temperature 98.1 F (36.7 C), resp. rate 18, SpO2 97 %. There is no height or weight on file to calculate BMI.  Medical Decision Making: Patient remains suicidal with plan to slit her throat.  Patient reports hearing voices telling her to kill herself.  We will reevaluate in am and determine appropriate disposition.  Patient  is in agreement to engage in therapy.  We will offer Seroquel and Lamictal -her home medications.  Problem 1: Bipolar 1 disorder, current and most recent episode depressed with Psychotic features.  Problem 2: Suicide ideation  Disposition:  Monitor and reevaluate in am for appropriate disposition.  Earney Navy, NP-PMHNP-BC 08/12/2022 8:14 PM

## 2022-08-12 NOTE — ED Provider Notes (Signed)
[facility]-EMERGENCY DEPT Provider Note   CSN: [id number] Arrival date & time: 08/12/22  1401     History  Chief Complaint  Patient presents with   Suicide Attempt    Brittany Palmer is a 26 y.o. female with a past medical history of depression, autism spectrum and bipolar disorders presenting today with a suicide attempt.  She says that she has been feeling very depressed and suicidal for a long time but it has gotten worse since Thanksgiving.  She has had a plan to slit her throat but recently she has been cutting her wrists and abdomen.  She is in a group home.  She and the group home state that she is having auditory and visual hallucinations.  Says that she "feels things" and sees monsters, ghosts, crosses and other objects that are not there.  HPI     Home Medications Prior to Admission medications   Medication Sig Start Date End Date Taking? Authorizing Provider  albuterol (PROVENTIL HFA;VENTOLIN HFA) 108 (90 Base) MCG/ACT inhaler Inhale 2 puffs into the lungs every 6 (six) hours as needed for wheezing or shortness of breath.     [provider]  ARIPiprazole (ABILIFY) 15 MG tablet Take 15 mg by mouth daily.    [provider]  budesonide-formoterol (SYMBICORT) 160-4.5 MCG/ACT inhaler Inhale 2 puffs into the lungs 2 (two) times daily.    [provider]  Cholecalciferol (VITAMIN D3) 50 MCG (2000 UT) capsule Take 4,000 Units by mouth daily.    [provider]  lamoTRIgine (LAMICTAL) 25 MG tablet Take 25 mg by mouth daily.    [provider]  loratadine (CLARITIN) 10 MG tablet Take 10 mg by mouth daily.    [provider]  mometasone-formoterol (DULERA) 100-5 MCG/ACT AERO Inhale 2 puffs into the lungs 2 (two) times daily. Patient not taking: Reported on 12/16/2019 05/25/18   Pucilowska, Braulio Conte B, MD  QUEtiapine (SEROQUEL) 400 MG tablet Take 400 mg by mouth at bedtime.    [provider]   verapamil (CALAN) 40 MG tablet Take 40 mg by mouth 2 (two) times daily.    [provider]      Allergies    Fluoxetine, Lisinopril, and Risperdal [risperidone]    Review of Systems   Review of Systems  Physical Exam Updated Vital Signs BP (!) 161/102 (BP Location: Left Arm)   Pulse (!) 106   Temp 98.1 F (36.7 C) (Oral)   Resp 18   SpO2 96%  Physical Exam Vitals and nursing note reviewed.  Constitutional:      Appearance: Normal appearance.  HENT:     Head: Normocephalic and atraumatic.  Eyes:     General: No scleral icterus.    Conjunctiva/sclera: Conjunctivae normal.  Pulmonary:     Effort: Pulmonary effort is normal. No respiratory distress.  Skin:    Findings: No rash.     Comments: Superficial abrasions to the right upper extremity as well as abdomen  Neurological:     Mental Status: She is alert.  Psychiatric:        Mood and Affect: Mood normal.     ED Results / Procedures / Treatments   Labs (all labs ordered are listed, but only abnormal results are displayed) Labs Reviewed  COMPREHENSIVE METABOLIC PANEL - Abnormal; Notable for the following components:      Result Value   AST 13 (*)    All other components within normal limits  ACETAMINOPHEN LEVEL - Abnormal;  Notable for the following components:   Acetaminophen (Tylenol), Serum <10 (*)    All other components within normal limits  SALICYLATE LEVEL - Abnormal; Notable for the following components:   Salicylate Lvl <7.0 (*)    All other components within normal limits  ETHANOL  RAPID URINE DRUG SCREEN, HOSP PERFORMED  CBC WITH DIFFERENTIAL/PLATELET  PREGNANCY, URINE  I-STAT BETA HCG BLOOD, ED ([facility], [facility], [facility] ONLY)    EKG None  Radiology No results found.  Procedures Procedures   Medications Ordered in ED Medications - No data to display  ED Course/ Medical Decision Making/ A&P                           Medical Decision Making Amount and/or Complexity of Data Reviewed Labs:  ordered.   26 year old female presenting today from her group home due to depression and suicidal ideation.  History of autism spectrum disorder as well.  She is with a caretaker.  At this time her medical clearance labs ordered in triage are within normal limits.  She is stable for psych evaluation.  Psychiatric team to dispo  Final Clinical Impression(s) / ED Diagnoses Final diagnoses:  Suicidal behavior with attempted self-injury [facility])    Rx / DC Orders ED Discharge Orders     None         Yarden Hillis A, PA-C 08/12/22 1859    Rolan Bucco, MD 08/12/22 2304

## 2022-08-12 NOTE — ED Notes (Signed)
Patient changed out into burgandy scrubs. Patient has 1 patient belongings bag and 1 black book bag located across from room 4 at the nurse station.

## 2022-08-13 ENCOUNTER — Encounter (HOSPITAL_COMMUNITY): Payer: Self-pay | Admitting: Emergency Medicine

## 2022-08-13 DIAGNOSIS — F315 Bipolar disorder, current episode depressed, severe, with psychotic features: Secondary | ICD-10-CM | POA: Diagnosis not present

## 2022-08-13 LAB — URINALYSIS, ROUTINE W REFLEX MICROSCOPIC
Bacteria, UA: NONE SEEN
Bilirubin Urine: NEGATIVE
Glucose, UA: NEGATIVE mg/dL
Ketones, ur: NEGATIVE mg/dL
Leukocytes,Ua: NEGATIVE
Nitrite: NEGATIVE
Protein, ur: NEGATIVE mg/dL
Specific Gravity, Urine: 1.003 — ABNORMAL LOW (ref 1.005–1.030)
pH: 7 (ref 5.0–8.0)

## 2022-08-13 LAB — RESP PANEL BY RT-PCR (RSV, FLU A&B, COVID)  RVPGX2
Influenza A by PCR: NEGATIVE
Influenza B by PCR: NEGATIVE
Resp Syncytial Virus by PCR: NEGATIVE
SARS Coronavirus 2 by RT PCR: NEGATIVE

## 2022-08-13 MED ORDER — ONDANSETRON 4 MG PO TBDP
4.0000 mg | ORAL_TABLET | Freq: Three times a day (TID) | ORAL | Status: DC | PRN
  Administered 2022-08-13 (×3): 4 mg via ORAL
  Filled 2022-08-13 (×3): qty 1

## 2022-08-13 MED ORDER — ACETAMINOPHEN 325 MG PO TABS
650.0000 mg | ORAL_TABLET | Freq: Once | ORAL | Status: AC
  Administered 2022-08-13: 650 mg via ORAL
  Filled 2022-08-13: qty 2

## 2022-08-13 NOTE — Progress Notes (Signed)
[facility] ED Progress Note  08/13/2022 12:17 PM [place] DOST  MRN:  [id number]   Subjective:  Brittany Palmer is a 26 y.o. female patient admitted with hx significant for .schizoaffective disorder, depressed type, suicide ideation, Autism Spectrum Disorder, MDD and Schizophrenic disorder brought to the ER by her group home staff member.  Patient tried to cut her right wrist, stomach and bilateral thighs on Thursday and again on Saturday.  She used a glass from a mirror and cut herself superficially.  Patient reports she has been feeling suicidal since Thanks giving period.  Patient was seen this morning who presented a sad affect and reported mood is depressed.  She is still feeling suicidal and plans to slit her throat.  He stressor she says is that her parents does not care about her.  Patient lives in a group home and is involved in meal preparation and is exposed to sharp objects including knives.  We will continue to seek inpatient Mental healthcare placement at any facility with available bed.  She denies HI/VH. Principal Problem: Bipolar I disorder, current or most recent episode depressed, with psychotic features (HCC) Diagnosis:  Principal Problem:   Bipolar I disorder, current or most recent episode depressed, with psychotic features (HCC) Active Problems:   Suicidal ideations   ED Assessment Time Calculation: Start Time: 1204 Stop Time: 1217 Total Time in Minutes (Assessment Completion): 13   Past Psychiatric History: See initial Psychiatric evaluation note  [place] Scale:  Flowsheet Row ED from 08/12/2022 in [facility] from 05/03/2022 in [facility] from 12/27/2021 in [facility] EM[facility]GENCY DEPARTMENT  C-SSRS RISK CATEGORY High Risk High Risk High Risk       Past Medical History:  Past Medical History:  Diagnosis Date   Asthma    Autism    GERD (gastroesophageal reflux disease)     Hypertension     Past Surgical History:  Procedure Laterality Date   CYSTECTOMY     DENTAL SURGERY     TONSILLECTOMY     Family History: History reviewed. No pertinent family history. Family Psychiatric  History: See initial Psychiatric evaluation note Social History:  Social History   Substance and Sexual Activity  Alcohol Use No     Social History   Substance and Sexual Activity  Drug Use No    Social History   Socioeconomic History   Marital status: Single    Spouse name: Not on file   Number of children: Not on file   Years of education: Not on file   Highest education level: Not on file  Occupational History   Not on file  Tobacco Use   Smoking status: Never   Smokeless tobacco: Never  Vaping Use   Vaping Use: Never used  Substance and Sexual Activity   Alcohol use: No   Drug use: No   Sexual activity: Never  Other Topics Concern   Not on file  Social History Narrative   Not on file   Social Determinants of Health   Financial Resource Strain: Not on file  Food Insecurity: Not on file  Transportation Needs: Not on file  Physical Activity: Not on file  Stress: Not on file  Social Connections: Not on file    Sleep: Good  Appetite:  Good  Current Medications: Current Facility-Administered Medications  Medication Dose Route Frequency Provider Last Rate Last Admin   lamoTRIgine (LAMICTAL) tablet 25 mg  25 mg Oral  Daily Dahlia Byes C, NP   25 mg at 08/13/22 1035   ondansetron (ZOFRAN-ODT) disintegrating tablet 4 mg  4 mg Oral Q8H PRN Long, Arlyss Repress, MD   4 mg at 08/13/22 0936   QUEtiapine (SEROQUEL) tablet 200 mg  200 mg Oral QHS Dahlia Byes C, NP       Current Outpatient Medications  Medication Sig Dispense Refill   albuterol (PROVENTIL HFA;VENTOLIN HFA) 108 (90 Base) MCG/ACT inhaler Inhale 2 puffs into the lungs every 6 (six) hours as needed for wheezing or shortness of breath.      ARIPiprazole (ABILIFY) 15 MG tablet Take 15 mg by  mouth daily.     budesonide-formoterol (SYMBICORT) 160-4.5 MCG/ACT inhaler Inhale 2 puffs into the lungs 2 (two) times daily.     Cholecalciferol (VITAMIN D3) 50 MCG (2000 UT) capsule Take 4,000 Units by mouth daily.     lamoTRIgine (LAMICTAL) 25 MG tablet Take 25 mg by mouth daily.     loratadine (CLARITIN) 10 MG tablet Take 10 mg by mouth daily.     mometasone-formoterol (DULERA) 100-5 MCG/ACT AERO Inhale 2 puffs into the lungs 2 (two) times daily. (Patient not taking: Reported on 12/16/2019) 1 Inhaler 1   QUEtiapine (SEROQUEL) 400 MG tablet Take 400 mg by mouth at bedtime.     verapamil (CALAN) 40 MG tablet Take 40 mg by mouth 2 (two) times daily.      Lab Results:  Results for orders placed or performed during the hospital encounter of 08/12/22 (from the past 48 hour(s))  Comprehensive metabolic panel     Status: Abnormal   Collection Time: 08/12/22  3:37 PM  Result Value Ref Range   Sodium 139 135 - 145 mmol/L   Potassium 4.0 3.5 - 5.1 mmol/L   Chloride 108 98 - 111 mmol/L   CO2 24 22 - 32 mmol/L   Glucose, Bld 89 70 - 99 mg/dL    Comment: Glucose reference range applies only to samples taken after fasting for at least 8 hours.   BUN 10 6 - 20 mg/dL   Creatinine, Ser 1.49 0.44 - 1.00 mg/dL   Calcium 9.1 8.9 - 70.2 mg/dL   Total Protein 7.3 6.5 - 8.1 g/dL   Albumin 4.3 3.5 - 5.0 g/dL   AST 13 (L) 15 - 41 U/L   ALT 14 0 - 44 U/L   Alkaline Phosphatase 51 38 - 126 U/L   Total Bilirubin 0.4 0.3 - 1.2 mg/dL   GFR, Estimated >63 >78 mL/min    Comment: (NOTE) Calculated using the CKD-EPI Creatinine Equation (2021)    Anion gap 7 5 - 15    Comment: Performed at [facility], [address] [place], [place] [address]  Ethanol     Status: None   Collection Time: 08/12/22  3:37 PM  Result Value Ref Range   Alcohol, Ethyl (B) <10 <10 mg/dL    Comment: (NOTE) Lowest detectable limit for serum alcohol is 10 mg/dL.  For medical purposes only. Performed at [facility], [address] [place], [place] [address]   Urine rapid drug screen (hosp performed)     Status: None   Collection Time: 08/12/22  3:37 PM  Result Value Ref Range   Opiates NONE DETECTED NONE DETECTED   Cocaine NONE DETECTED NONE DETECTED   Benzodiazepines NONE DETECTED NONE DETECTED   Amphetamines NONE DETECTED NONE DETECTED   Tetrahydrocannabinol NONE DETECTED NONE DETECTED   Barbiturates NONE DETECTED NONE  DETECTED    Comment: (NOTE) DRUG SCREEN FOR MEDICAL PURPOSES ONLY.  IF CONFIRMATION IS NEEDED FOR ANY PURPOSE, NOTIFY LAB WITHIN 5 DAYS.  LOWEST DETECTABLE LIMITS FOR URINE DRUG SCREEN Drug Class                     Cutoff (ng/mL) Amphetamine and metabolites    1000 Barbiturate and metabolites    200 Benzodiazepine                 200 Opiates and metabolites        300 Cocaine and metabolites        300 THC                            50 Performed at [facility], [address] [place], [place] [address]   CBC with Diff     Status: None   Collection Time: 08/12/22  3:37 PM  Result Value Ref Range   WBC 6.6 4.0 - 10.5 K/uL   RBC 4.44 3.87 - 5.11 MIL/uL   Hemoglobin 13.3 12.0 - 15.0 g/dL   HCT 89.3 81.0 - 17.5 %   MCV 90.8 80.0 - 100.0 fL   MCH 30.0 26.0 - 34.0 pg   MCHC 33.0 30.0 - 36.0 g/dL   RDW 10.2 58.5 - 27.7 %   Platelets 244 150 - 400 K/uL   nRBC 0.0 0.0 - 0.2 %   Neutrophils Relative % 64 %   Neutro Abs 4.2 1.7 - 7.7 K/uL   Lymphocytes Relative 28 %   Lymphs Abs 1.9 0.7 - 4.0 K/uL   Monocytes Relative 6 %   Monocytes Absolute 0.4 0.1 - 1.0 K/uL   Eosinophils Relative 1 %   Eosinophils Absolute 0.1 0.0 - 0.5 K/uL   Basophils Relative 1 %   Basophils Absolute 0.0 0.0 - 0.1 K/uL   Immature Granulocytes 0 %   Abs Immature Granulocytes 0.01 0.00 - 0.07 K/uL    Comment: Performed at [facility], [address] [place], [place] [address]  Acetaminophen level     Status: Abnormal    Collection Time: 08/12/22  3:37 PM  Result Value Ref Range   Acetaminophen (Tylenol), Serum <10 (L) 10 - 30 ug/mL    Comment: (NOTE) Therapeutic concentrations vary significantly. A range of 10-30 ug/mL  may be an effective concentration for many patients. However, some  are best treated at concentrations outside of this range. Acetaminophen concentrations >150 ug/mL at 4 hours after ingestion  and >50 ug/mL at 12 hours after ingestion are often associated with  toxic reactions.  Performed at [facility], [address], [place], [place] [address]   Salicylate level     Status: Abnormal   Collection Time: 08/12/22  3:37 PM  Result Value Ref Range   Salicylate Lvl <7.0 (L) 7.0 - 30.0 mg/dL    Comment: Performed at [facility], [address] [place], [place] [address]  Pregnancy, urine     Status: None   Collection Time: 08/12/22  3:40 PM  Result Value Ref Range   Preg Test, Ur NEGATIVE NEGATIVE    Comment:        THE SENSITIVITY OF THIS METHODOLOGY IS >20 mIU/mL. Performed at [facility], [address], [place], [place] [address]   Resp panel by RT-PCR (RSV, Flu A&B, Covid) Anterior Nasal Swab  Status: None   Collection Time: 08/13/22  7:44 AM   Specimen: Anterior Nasal Swab  Result Value Ref Range   SARS Coronavirus 2 by RT PCR NEGATIVE NEGATIVE    Comment: (NOTE) SARS-CoV-2 target nucleic acids are NOT DETECTED.  The SARS-CoV-2 RNA is generally detectable in upper respiratory specimens during the acute phase of infection. The lowest concentration of SARS-CoV-2 viral copies this assay can detect is 138 copies/mL. A negative result does not preclude SARS-Cov-2 infection and should not be used as the sole basis for treatment or other patient management decisions. A negative result may occur with  improper specimen collection/handling, submission of specimen other than nasopharyngeal swab, presence of viral  mutation(s) within the areas targeted by this assay, and inadequate number of viral copies(<138 copies/mL). A negative result must be combined with clinical observations, patient history, and epidemiological information. The expected result is Negative.  Fact Sheet for Patients:  BloggerCourse.com  Fact Sheet for Healthcare Providers:  SeriousBroker.it  This test is no t yet approved or cleared by the [place] FDA and  has been authorized for detection and/or diagnosis of SARS-CoV-2 by FDA under an Emergency Use Authorization (EUA). This EUA will remain  in effect (meaning this test can be used) for the duration of the COVID-19 declaration under Section 564(b)(1) of the Act, 21 U.S.C.section 360bbb-3(b)(1), unless the authorization is terminated  or revoked sooner.       Influenza A by PCR NEGATIVE NEGATIVE   Influenza B by PCR NEGATIVE NEGATIVE    Comment: (NOTE) The Xpert Xpress SARS-CoV-2/FLU/RSV plus assay is intended as an aid in the diagnosis of influenza from Nasopharyngeal swab specimens and should not be used as a sole basis for treatment. Nasal washings and aspirates are unacceptable for Xpert Xpress SARS-CoV-2/FLU/RSV testing.  Fact Sheet for Patients: BloggerCourse.com  Fact Sheet for Healthcare Providers: SeriousBroker.it  This test is not yet approved or cleared by the [place] FDA and has been authorized for detection and/or diagnosis of SARS-CoV-2 by FDA under an Emergency Use Authorization (EUA). This EUA will remain in effect (meaning this test can be used) for the duration of the COVID-19 declaration under Section 564(b)(1) of the Act, 21 U.S.C. section 360bbb-3(b)(1), unless the authorization is terminated or revoked.     Resp Syncytial Virus by PCR NEGATIVE NEGATIVE    Comment: (NOTE) Fact Sheet for  Patients: BloggerCourse.com  Fact Sheet for Healthcare Providers: SeriousBroker.it  This test is not yet approved or cleared by the [place] FDA and has been authorized for detection and/or diagnosis of SARS-CoV-2 by FDA under an Emergency Use Authorization (EUA). This EUA will remain in effect (meaning this test can be used) for the duration of the COVID-19 declaration under Section 564(b)(1) of the Act, 21 U.S.C. section 360bbb-3(b)(1), unless the authorization is terminated or revoked.  Performed at [facility], [address], [place], [place] [address]     Blood Alcohol level:  Lab Results  Component Value Date   [facility] <10 08/12/2022   ETH <10 12/27/2021    Physical Findings:  CIWA:    COWS:     Musculoskeletal: Strength & Muscle Tone: within normal limits Gait & Station: normal Patient leans: Front  Psychiatric Specialty Exam:  Presentation  General Appearance:  Casual  Eye Contact: Good  Speech: Clear and Coherent; Normal Rate  Speech Volume: Normal  Handedness: Right   Mood and Affect  Mood: Depressed  Affect: Congruent   Thought Process  Thought Processes:  Coherent; Goal Directed; Linear  Descriptions of Associations:Intact  Orientation:Full (Time, Place and Person)  Thought Content:Logical  History of Schizophrenia/Schizoaffective disorder:Yes  Duration of Psychotic Symptoms:Greater than six months  Hallucinations:Hallucinations: Auditory Description of Auditory Hallucinations: Voices telling her to kill herself  Ideas of Reference:None  Suicidal Thoughts:Suicidal Thoughts: Yes, Active SI Active Intent and/or Plan: With Intent; With Plan; With Means to Carry Out; With Access to Means  Homicidal Thoughts:Homicidal Thoughts: No   Sensorium  Memory: Immediate Good; Recent Good; Remote Good  Judgment: Intact  Insight: Fair   Restaurant manager, fast foodxecutive  Functions  Concentration: Good  Attention Span: Good  Recall: Good  Fund of Knowledge: Good  Language: Good   Psychomotor Activity  Psychomotor Activity: Psychomotor Activity: Normal   Assets  Assets: Communication Skills; Desire for Improvement; Housing; Social Support; Physical Health   Sleep  Sleep: Sleep: Good    Physical Exam: Physical Exam Vitals and nursing note reviewed.  Constitutional:      Appearance: Normal appearance.  HENT:     Head: Normocephalic and atraumatic.     Nose: Nose normal.  Cardiovascular:     Rate and Rhythm: Normal rate and regular rhythm.  Pulmonary:     Effort: Pulmonary effort is normal.  Musculoskeletal:        General: Normal range of motion.     Cervical back: Normal range of motion.  Skin:    General: Skin is warm and dry.  Neurological:     General: No focal deficit present.     Mental Status: She is alert and oriented to person, place, and time.    Review of Systems  Constitutional: Negative.   HENT: Negative.    Eyes: Negative.   Respiratory: Negative.    Cardiovascular: Negative.   Gastrointestinal: Negative.   Genitourinary: Negative.   Musculoskeletal: Negative.   Skin: Negative.   Neurological: Negative.   Endo/Heme/Allergies: Negative.   Psychiatric/Behavioral:  Positive for depression, hallucinations and suicidal ideas. The patient is nervous/anxious.    Blood pressure (!) 147/101, pulse 100, temperature 97.9 F (36.6 C), temperature source Oral, resp. rate 18, height 5\' 5"  (1.651 m), weight 68 kg, SpO2 100 %. Body mass index is 24.96 kg/m.  Medical Decision Making: Patient continues to meet criteria for inpatient Psychiatry hospitalization.  She remains suicidal with plan to slit her throat.  Last week she superficially cut her right wrist, thighs and abdomen.  We will continue treating in the ER while we look for bed placement.  Problem 1: Bipolar 1 disorder, current and most recent episode  depressed with Psychotic features.   Problem 2: Suicide ideation  Earney NavyJosephine C Jaclyne Haverstick, NP-PMHNP-BC 08/13/2022, 12:17 PM

## 2022-08-13 NOTE — ED Notes (Signed)
Patient c/o HA.  Verbal order received for acetaminophen

## 2022-08-13 NOTE — Progress Notes (Signed)
Pt was accepted to [facility] TODAY 08/13/2022  Pt meets inpatient criteria per Earney Navy, NP-PMHNP-BC  Attending Physician will be Estill Cotta, MD  Report can be called to: [phone] (this is a pager, please leave return call-back number)  Bed is ready now  Care Team Notified: Lind Covert, RN, I Eyvonne Mechanic, RN, and [address], LCSWA  [place], [place]  08/13/2022 1:55 PM

## 2022-08-13 NOTE — Progress Notes (Addendum)
Pt is currently under review at [facility]. [facility] admissions requested a UA and more information regarding pt Autism diagnosis and ADL capacity. CSW spoke with I Eyvonne Mechanic, RN about pt ADL capacity. CSW left a voicemail with [facility] admissions requesting a call back to provide more information.CSW will continue to assist and follow with placement.  Brittany Palmer  08/13/2022 12:57 PM

## 2022-08-13 NOTE — Progress Notes (Signed)
LCSW Progress Note  [id number]   JALEA BRONAUGH  08/13/2022  11:27 AM  Description:   Inpatient Psychiatric Referral  Patient was recommended inpatient per Earney Navy, NP-PMHNP-BC. There are no available beds at [facility]. Patient was referred to the following facilities:   Destination Service Provider Address Phone Fax  [facility]  [address] [place], [place] [address] [phone]  [facility]  [address] [place] [address] [phone]  [facility]  [address], [place] [address] [phone]  [facility]  [address] [place] [address] [phone]  [facility]  [address], [place] [address] [phone]  [facility]  [address] [place] [address] [phone]  [facility]  [address], [name] [address] [phone]  [facility]  [address], [place] [address] [phone]  [facility]  [address] [place], [place] [address] [phone]  [facility]  [address] [place], [place] [address] [phone]  [facility]  [address], [place] [address] [phone]     Situation ongoing, CSW to continue following and update chart as more information becomes available.      Cathie Beams, [place]  08/13/2022 11:27 AM

## 2022-08-13 NOTE — ED Notes (Signed)
Message left at [facility]

## 2022-08-13 NOTE — ED Notes (Signed)
Contacted pager  [phone] at [facility]. Waiting for a callback at this time.

## 2022-08-13 NOTE — ED Notes (Signed)
Contacted pager at [facility]. Waiting on callback.

## 2022-08-13 NOTE — ED Provider Notes (Signed)
Emergency Medicine Observation Re-evaluation Note  Brittany Palmer is a 26 y.o. female, seen on rounds today.  Pt initially presented to the ED for complaints of Suicide Attempt Currently, the patient is awaiting re-evaluation.  Physical Exam  BP (!) 148/90   Pulse 92   Temp 98 F (36.7 C)   Resp 16   SpO2 99%  Physical Exam General: Calm Cardiac: well perfused Lungs: even respirations Psych: calm  ED Course / MDM  EKG:   I have reviewed the labs performed to date as well as medications administered while in observation.  Recent changes in the last 24 hours include patient evaluated by psychiatry with plan to re-evaluate this AM.  Plan  Current plan is for re-evaluation this AM. Have added respiratory viral panel with patient complaining of cough and HA this AM.    Brittany Palmer, Arlyss Repress, MD 08/14/22 [phone]

## 2022-08-13 NOTE — BH Assessment (Addendum)
Pt meets inpatient criteria per Earney Navy, NP-PMHNP-BC. Patient accepted to [facility] for admission. Bed ready for patient's admission.

## 2022-08-13 NOTE — ED Notes (Signed)
Multiple attempt made to give report to [facility].  Message left on pager number provided.  Call has not been returned at this time

## 2022-08-14 DIAGNOSIS — F315 Bipolar disorder, current episode depressed, severe, with psychotic features: Secondary | ICD-10-CM | POA: Diagnosis not present

## 2022-08-14 NOTE — ED Notes (Signed)
[facility] Admission Pager called and call back number left.  Continue to await call back

## 2022-08-14 NOTE — ED Notes (Signed)
Attempted to call mother for contact information regarding filling out paperwork for [facility], no answer at this time.

## 2022-08-14 NOTE — ED Notes (Signed)
Form received from [facility]

## 2022-08-14 NOTE — Progress Notes (Signed)
[facility] Psych ED Progress Note  08/14/2022 4:59 PM Brittany Palmer  MRN:  [id number]   Subjective:  Brittany Palmer is a 26 y.o. female patient admitted with hx significant for .schizoaffective disorder, depressed type, suicide ideation, Autism Spectrum Disorder, MDD and Schizophrenic disorder brought to the ER by her group home staff member.  Patient tried to cut her right wrist, stomach and bilateral thighs on Thursday and again on Saturday.  She used a glass from a mirror and cut herself superficially.  Patient remains suicidal today with plan to slit her  throat with a sharp object.  Patient is accepted at [facility] in [place].  They are waiting for mom who is legal guardian to sign voluntary admission form before transportation.  Patient denies HI/VH.  Principal Problem: Bipolar I disorder, current or most recent episode depressed, with psychotic features (HCC) Diagnosis:  Principal Problem:   Bipolar I disorder, current or most recent episode depressed, with psychotic features (HCC) Active Problems:   Suicidal ideations   ED Assessment Time Calculation: Start Time: 1642 Stop Time: 1653 Total Time in Minutes (Assessment Completion): 11   Past Psychiatric History: See initial Psychiatric evaluation note  [place] Scale:  Flowsheet Row ED from 08/12/2022 in [facility] from 05/03/2022 in [facility] from 12/27/2021 in [facility] EMERGENCY DEPARTMENT  C-SSRS RISK CATEGORY High Risk High Risk High Risk       Past Medical History:  Past Medical History:  Diagnosis Date   Asthma    Autism    GERD (gastroesophageal reflux disease)    Hypertension     Past Surgical History:  Procedure Laterality Date   CYSTECTOMY     DENTAL SURGERY     TONSILLECTOMY     Family History: History reviewed. No pertinent family history. Family Psychiatric  History: See initial Psychiatric evaluation note Social History:   Social History   Substance and Sexual Activity  Alcohol Use No     Social History   Substance and Sexual Activity  Drug Use No    Social History   Socioeconomic History   Marital status: Single    Spouse name: Not on file   Number of children: Not on file   Years of education: Not on file   Highest education level: Not on file  Occupational History   Not on file  Tobacco Use   Smoking status: Never   Smokeless tobacco: Never  Vaping Use   Vaping Use: Never used  Substance and Sexual Activity   Alcohol use: No   Drug use: No   Sexual activity: Never  Other Topics Concern   Not on file  Social History Narrative   Not on file   Social Determinants of Health   Financial Resource Strain: Not on file  Food Insecurity: Not on file  Transportation Needs: Not on file  Physical Activity: Not on file  Stress: Not on file  Social Connections: Not on file    Sleep: Good  Appetite:  Good  Current Medications: Current Facility-Administered Medications  Medication Dose Route Frequency Provider Last Rate Last Admin   lamoTRIgine (LAMICTAL) tablet 25 mg  25 mg Oral Daily Thaniel Coluccio C, NP   25 mg at 08/14/22 0935   ondansetron (ZOFRAN-ODT) disintegrating tablet 4 mg  4 mg Oral Q8H PRN Long, Arlyss Repress, MD   4 mg at 08/13/22 2308   QUEtiapine (SEROQUEL) tablet 200 mg  200 mg Oral QHS  Earney Navy, NP   200 mg at 08/13/22 2309   Current Outpatient Medications  Medication Sig Dispense Refill   acetaminophen (TYLENOL) 160 MG/5ML suspension Take 640 mg by mouth every 4 (four) hours as needed for mild pain or fever.     albuterol (PROVENTIL HFA;VENTOLIN HFA) 108 (90 Base) MCG/ACT inhaler Inhale 2 puffs into the lungs every 6 (six) hours as needed for wheezing or shortness of breath (or coughing).     aluminum-magnesium hydroxide 200-200 MG/5ML suspension Take 15-30 mLs by mouth every 2 (two) hours as needed for indigestion (15 ml's if under 100 pounds and 30 ml's if  over 100 pounds).     ARIPiprazole (ABILIFY) 15 MG tablet Take 15 mg by mouth in the morning.     bisacodyl (DULCOLAX) 10 MG suppository Place 10 mg rectally daily as needed for mild constipation (if no B/M in 72 hours).     budesonide-formoterol (SYMBICORT) 160-4.5 MCG/ACT inhaler Inhale 2 puffs into the lungs 2 (two) times daily.     Cholecalciferol (VITAMIN D3) 50 MCG (2000 UT) capsule Take 4,000 Units by mouth daily.     diphenhydrAMINE (BENADRYL) 12.5 MG/5ML elixir Take 25 mg by mouth every 4 (four) hours as needed for itching or allergies (or nasal congestion).     guaifenesin (ROBITUSSIN) 100 MG/5ML syrup Take 300 mg by mouth every 4 (four) hours as needed for cough.     hydrocortisone cream 1 % Apply 1 Application topically as needed for itching (or bug bites, skin irritation, rashes, chapped skin- affected areas).     hydrOXYzine (VISTARIL) 50 MG capsule Take 50 mg by mouth every 8 (eight) hours as needed ("for behaviors not controlled by BSP lasting longer than 5 minutes").     lamoTRIgine (LAMICTAL) 25 MG tablet Take 50 mg by mouth in the morning.     loperamide (IMODIUM A-D) 2 MG tablet Take 2-4 mg by mouth See admin instructions. Take 4 mg by mouth for initial dose of diarrhea and 2 mg after each subsequent loose stool- cannot exceed a total of 16 mg/24 hours     loratadine (CLARITIN) 10 MG tablet Take 10 mg by mouth daily.     magnesium hydroxide (MILK OF MAGNESIA) 400 MG/5ML suspension Take 15-30 mLs by mouth daily as needed for mild constipation (15 ml's if under 100 pounds and 30 ml's if over 100 pounds- if no relief from Dulcolax suppository).     neomycin-bacitracin-polymyxin [phone] OINT Apply 1 Application topically See admin instructions. Apply twice daily to superficial abrasions and lacerations after cleansing. Cover with a dressing.     promethazine (PHENERGAN) 25 MG tablet Take 25 mg by mouth See admin instructions. Take 25 mg by mouth every 4-6 hours as needed for  vomiting 2 or more times in four hours and call MD if symptoms persist     QUEtiapine (SEROQUEL) 400 MG tablet Take 400 mg by mouth at bedtime.     QUEtiapine (SEROQUEL) 50 MG tablet Take 50 mg by mouth See admin instructions. Take 50 mg by mouth in the morning and at noon     sodium phosphate (FLEET) 7-19 GM/118ML ENEM Place 1 enema rectally daily as needed (for constipation if no results from Dulcolax suppository and milk of magnesia).     verapamil (CALAN) 120 MG tablet Take 120 mg by mouth in the morning and at bedtime.     verapamil (CALAN) 40 MG tablet Take 40 mg by mouth daily at 12 noon.  Vitamins A & D (VITAMIN A & D) ointment Apply 1 Application topically 3 (three) times daily as needed for dry skin (or skin irritation, rashes, or chapped skin).     mometasone-formoterol (DULERA) 100-5 MCG/ACT AERO Inhale 2 puffs into the lungs 2 (two) times daily. (Patient not taking: Reported on 08/14/2022) 1 Inhaler 1    Lab Results:  Results for orders placed or performed during the hospital encounter of 08/12/22 (from the past 48 hour(s))  Resp panel by RT-PCR (RSV, Flu A&B, Covid) Anterior Nasal Swab     Status: None   Collection Time: 08/13/22  7:44 AM   Specimen: Anterior Nasal Swab  Result Value Ref Range   SARS Coronavirus 2 by RT PCR NEGATIVE NEGATIVE    Comment: (NOTE) SARS-CoV-2 target nucleic acids are NOT DETECTED.  The SARS-CoV-2 RNA is generally detectable in upper respiratory specimens during the acute phase of infection. The lowest concentration of SARS-CoV-2 viral copies this assay can detect is 138 copies/mL. A negative result does not preclude SARS-Cov-2 infection and should not be used as the sole basis for treatment or other patient management decisions. A negative result may occur with  improper specimen collection/handling, submission of specimen other than nasopharyngeal swab, presence of viral mutation(s) within the areas targeted by this assay, and inadequate  number of viral copies(<138 copies/mL). A negative result must be combined with clinical observations, patient history, and epidemiological information. The expected result is Negative.  Fact Sheet for Patients:  BloggerCourse.com  Fact Sheet for Healthcare Providers:  SeriousBroker.it  This test is no t yet approved or cleared by the [place] FDA and  has been authorized for detection and/or diagnosis of SARS-CoV-2 by FDA under an Emergency Use Authorization (EUA). This EUA will remain  in effect (meaning this test can be used) for the duration of the COVID-19 declaration under Section 564(b)(1) of the Act, 21 U.S.C.section 360bbb-3(b)(1), unless the authorization is terminated  or revoked sooner.       Influenza A by PCR NEGATIVE NEGATIVE   Influenza B by PCR NEGATIVE NEGATIVE    Comment: (NOTE) The Xpert Xpress SARS-CoV-2/FLU/RSV plus assay is intended as an aid in the diagnosis of influenza from Nasopharyngeal swab specimens and should not be used as a sole basis for treatment. Nasal washings and aspirates are unacceptable for Xpert Xpress SARS-CoV-2/FLU/RSV testing.  Fact Sheet for Patients: BloggerCourse.com  Fact Sheet for Healthcare Providers: SeriousBroker.it  This test is not yet approved or cleared by the [place] FDA and has been authorized for detection and/or diagnosis of SARS-CoV-2 by FDA under an Emergency Use Authorization (EUA). This EUA will remain in effect (meaning this test can be used) for the duration of the COVID-19 declaration under Section 564(b)(1) of the Act, 21 U.S.C. section 360bbb-3(b)(1), unless the authorization is terminated or revoked.     Resp Syncytial Virus by PCR NEGATIVE NEGATIVE    Comment: (NOTE) Fact Sheet for Patients: BloggerCourse.com  Fact Sheet for Healthcare  Providers: SeriousBroker.it  This test is not yet approved or cleared by the [place] FDA and has been authorized for detection and/or diagnosis of SARS-CoV-2 by FDA under an Emergency Use Authorization (EUA). This EUA will remain in effect (meaning this test can be used) for the duration of the COVID-19 declaration under Section 564(b)(1) of the Act, 21 U.S.C. section 360bbb-3(b)(1), unless the authorization is terminated or revoked.  Performed at [facility], [address] [place], [place] [address]   Urinalysis, Routine w  reflex microscopic Urine, Clean Catch     Status: Abnormal   Collection Time: 08/13/22  1:28 PM  Result Value Ref Range   Color, Urine COLORLESS (A) YELLOW   APPearance CLEAR CLEAR   Specific Gravity, Urine 1.003 (L) 1.005 - 1.030   pH 7.0 5.0 - 8.0   Glucose, UA NEGATIVE NEGATIVE mg/dL   Hgb urine dipstick SMALL (A) NEGATIVE   Bilirubin Urine NEGATIVE NEGATIVE   Ketones, ur NEGATIVE NEGATIVE mg/dL   Protein, ur NEGATIVE NEGATIVE mg/dL   Nitrite NEGATIVE NEGATIVE   Leukocytes,Ua NEGATIVE NEGATIVE   Bacteria, UA NONE SEEN NONE SEEN   Squamous Epithelial / LPF 0-5 0 - 5    Comment: Performed at [facility], [address] [place], [place] [address]    Blood Alcohol level:  Lab Results  Component Value Date   ETH <10 08/12/2022   ETH <10 12/27/2021    Physical Findings:  CIWA:    COWS:     Musculoskeletal: Strength & Muscle Tone: within normal limits Gait & Station: normal Patient leans: Front  Psychiatric Specialty Exam:  Presentation  General Appearance:  Casual  Eye Contact: Good  Speech: Clear and Coherent; Normal Rate  Speech Volume: Normal  Handedness: Right   Mood and Affect  Mood: Anxious; Depressed  Affect: Congruent   Thought Process  Thought Processes: Coherent; Goal Directed; Linear  Descriptions of  Associations:Intact  Orientation:Full (Time, Place and Person)  Thought Content:Logical  History of Schizophrenia/Schizoaffective disorder:Yes  Duration of Psychotic Symptoms:Greater than six months  Hallucinations:Hallucinations: Auditory Description of Auditory Hallucinations: voices telling her to hurt self  Ideas of Reference:Paranoia (Parents does not like her and does not care about her.)  Suicidal Thoughts:Suicidal Thoughts: Yes, Active SI Active Intent and/or Plan: With Intent; With Plan; With Means to Carry Out; With Access to Means  Homicidal Thoughts:Homicidal Thoughts: No   Sensorium  Memory: Immediate Good; Recent Good; Remote Good  Judgment: Intact  Insight: Present   Executive Functions  Concentration: Good  Attention Span: Good  Recall: Good  Fund of Knowledge: Good  Language: Good   Psychomotor Activity  Psychomotor Activity: Psychomotor Activity: Normal   Assets  Assets: Communication Skills; Desire for Improvement; Housing; Social Support   Sleep  Sleep: Sleep: Good    Physical Exam: Physical Exam Vitals and nursing note reviewed.  Constitutional:      Appearance: Normal appearance.  HENT:     Head: Normocephalic and atraumatic.     Nose: Nose normal.  Cardiovascular:     Rate and Rhythm: Normal rate and regular rhythm.  Pulmonary:     Effort: Pulmonary effort is normal.  Musculoskeletal:        General: Normal range of motion.     Cervical back: Normal range of motion.  Skin:    General: Skin is warm and dry.  Neurological:     General: No focal deficit present.     Mental Status: She is alert and oriented to person, place, and time.    Review of Systems  Constitutional: Negative.   HENT: Negative.    Eyes: Negative.   Respiratory: Negative.    Cardiovascular: Negative.   Gastrointestinal: Negative.   Genitourinary: Negative.   Musculoskeletal: Negative.   Skin: Negative.   Neurological: Negative.    Endo/Heme/Allergies: Negative.   Psychiatric/Behavioral:  Positive for depression, hallucinations and suicidal ideas. The patient is nervous/anxious.    Blood pressure (!) 146/105, pulse 97, temperature 97.6 F (36.4 C), temperature source  Oral, resp. rate 18, height 5\' 5"  (1.651 m), weight 68 kg, SpO2 95 %. Body mass index is 24.96 kg/m.  Medical Decision Making: Patient continues to meet criteria for inpatient Psychiatry hospitalization.  She remains suicidal with plan to slit her throat.  Last week she superficially cut her right wrist, thighs and abdomen.  We will continue treating in the ER while paper work is completed [facility]. Problem 1: Bipolar 1 disorder, current and most recent episode depressed with Psychotic features.   Problem 2: Suicide ideation  Earney NavyJosephine C Yerik Zeringue, NP-PMHNP-BC 08/14/2022, 4:59 PM

## 2022-08-14 NOTE — ED Provider Notes (Signed)
Emergency Medicine Observation Re-evaluation Note  Brittany Palmer is a 26 y.o. female, seen on rounds today.  Pt initially presented to the ED for complaints of Suicide Attempt Currently, the patient is calm.  Physical Exam  BP (!) 127/90 (BP Location: Left Arm)   Pulse 86   Temp (!) 97.5 F (36.4 C) (Oral)   Resp 18   Ht 5\' 5"  (1.651 m)   Wt 68 kg   SpO2 100%   BMI 24.96 kg/m  Physical Exam General: awak   ED Course / MDM  EKG:   I have reviewed the labs performed to date as well as medications administered while in observation.  Recent changes in the last 24 hours include notjhing.  Plan  Current plan is for inpt psych.    , DO 08/14/22 [phone]

## 2022-08-14 NOTE — ED Notes (Signed)
Call placed to [facility] to [place] at [phone] to see if forms can be faxed over to ED.

## 2022-08-14 NOTE — ED Notes (Signed)
Call placed to mom/legal guardian regarding consent forms. She stated she lives to far to come to the hospital today. She requested the forms to be sent to her via email, as she can sign them on her lunch break.

## 2022-08-14 NOTE — ED Notes (Signed)
Received call back from [place] at [facility]. Provided fax number for forms to be sent. Return fax number is [phone]

## 2022-08-14 NOTE — ED Notes (Addendum)
Received call from [facility] for pending admission. Since pt has a legal guardian Stated they need voluntary consent forms signed before they can admit her. Also said if she was IVC then they would be able to accept without paperwork.

## 2022-08-14 NOTE — ED Notes (Signed)
Paperwork faxed to [facility]

## 2022-08-15 DIAGNOSIS — F315 Bipolar disorder, current episode depressed, severe, with psychotic features: Secondary | ICD-10-CM | POA: Diagnosis not present

## 2022-08-15 MED ORDER — VERAPAMIL HCL 40 MG PO TABS
40.0000 mg | ORAL_TABLET | Freq: Every day | ORAL | Status: DC
  Administered 2022-08-15 – 2022-08-16 (×2): 40 mg via ORAL
  Filled 2022-08-15 (×2): qty 1

## 2022-08-15 MED ORDER — ACETAMINOPHEN 325 MG PO TABS
650.0000 mg | ORAL_TABLET | Freq: Four times a day (QID) | ORAL | Status: DC | PRN
  Administered 2022-08-15: 650 mg via ORAL
  Filled 2022-08-15: qty 2

## 2022-08-15 MED ORDER — LORATADINE 10 MG PO TABS
10.0000 mg | ORAL_TABLET | Freq: Every day | ORAL | Status: DC
  Administered 2022-08-15 – 2022-08-16 (×2): 10 mg via ORAL
  Filled 2022-08-15 (×2): qty 1

## 2022-08-15 MED ORDER — VERAPAMIL HCL 120 MG PO TABS
120.0000 mg | ORAL_TABLET | Freq: Two times a day (BID) | ORAL | Status: DC
  Administered 2022-08-15 – 2022-08-16 (×2): 120 mg via ORAL
  Filled 2022-08-15 (×3): qty 1

## 2022-08-15 MED ORDER — MOMETASONE FURO-FORMOTEROL FUM 200-5 MCG/ACT IN AERO
2.0000 | INHALATION_SPRAY | Freq: Two times a day (BID) | RESPIRATORY_TRACT | Status: DC
  Filled 2022-08-15 (×2): qty 8.8

## 2022-08-15 MED ORDER — VITAMIN D 25 MCG (1000 UNIT) PO TABS
4000.0000 [IU] | ORAL_TABLET | Freq: Every day | ORAL | Status: DC
  Administered 2022-08-15 – 2022-08-16 (×2): 4000 [IU] via ORAL
  Filled 2022-08-15 (×2): qty 4

## 2022-08-15 MED ORDER — QUETIAPINE FUMARATE 50 MG PO TABS
50.0000 mg | ORAL_TABLET | Freq: Two times a day (BID) | ORAL | Status: DC
  Administered 2022-08-15 – 2022-08-16 (×3): 50 mg via ORAL
  Filled 2022-08-15 (×3): qty 1

## 2022-08-15 MED ORDER — ARIPIPRAZOLE 5 MG PO TABS
15.0000 mg | ORAL_TABLET | Freq: Every morning | ORAL | Status: DC
  Administered 2022-08-15 – 2022-08-16 (×2): 15 mg via ORAL
  Filled 2022-08-15 (×2): qty 1

## 2022-08-15 MED ORDER — QUETIAPINE FUMARATE 300 MG PO TABS
400.0000 mg | ORAL_TABLET | Freq: Every day | ORAL | Status: DC
  Administered 2022-08-15: 400 mg via ORAL
  Filled 2022-08-15: qty 1

## 2022-08-15 MED ORDER — QUETIAPINE FUMARATE 50 MG PO TABS: 50.0000 mg | ORAL_TABLET | ORAL | Status: DC

## 2022-08-15 MED ORDER — ARIPIPRAZOLE 5 MG PO TABS: 15.0000 mg | ORAL_TABLET | Freq: Every morning | ORAL | Status: DC

## 2022-08-15 NOTE — Progress Notes (Signed)
CSW communicated with care team in regards to follow up with signed voluntary consent by legal guardian. CSW spoke with [facility] intake about pending acceptance with signed voluntary consent. HH intake reports that bed is still available for pt tonight, pending signed voluntary consent. CSW spoke with Mickie Bail, RN about completing signed voluntary consent. RN reportedly spoke to pt's legal guardian/mother who will come to [facility] after 5 PM to sign consent tonight. CSW inquired about pt arriving at [facility] tonight, pending signed voluntary consent. HH intake reports that will be fine. CSW informed team of conversation with HH intake. CSW will assist and follow with placement.  Care Team Notified: Mickie Bail, RN, Staci Acosta, LCSWA, Nira Conn , NP, Pearlie Oyster, EMT-P, and Junior Tami Ribas, RN  [place], [place]  08/15/2022 3:23 PM

## 2022-08-15 NOTE — ED Notes (Signed)
Additional paperwork emailed to mother Annabelle Harman. States she will return as soon as possible.

## 2022-08-15 NOTE — Progress Notes (Signed)
[facility] ED Progress Note  08/15/2022 3:23 PM Brittany Palmer  MRN:  [id number]   Principal Problem: Bipolar I disorder, current or most recent episode depressed, with psychotic features (HCC) Diagnosis:  Principal Problem:   Bipolar I disorder, current or most recent episode depressed, with psychotic features (HCC) Active Problems:   Suicidal ideations   ED Assessment Time Calculation: Start Time: 1500 Stop Time: 1520 Total Time in Minutes (Assessment Completion): 20  Subjective:  Brittany Palmer 26 y.o., female patient presented to [facility] as a walk in accompanied by her group home staff member. Patient tried to kill herself with glass from a mirror. Cut her right wrist, stomach, left thigh. Patient states she has been feeling suicidal since thanksgiving.     HPI: Brittany Palmer, 26 y.o., female patient seen face to face by this provider, chart reviewed on 08/15/22. On evaluation Brittany Palmer reports that she is not feeling well "mentally or physically", when provider asked what was going on she stated that she is having a headache and she is depressed and having SI thoughts. Patient was given Tylenol 650 mg for headache at 1233p. Patient currently endorses SI thoughts, but able to contract for safety. Denies HI/AVH. Patient stated she has been eating and sleeping good while here in the hospital.   During evaluation Brittany Palmer is sitting in conference room with provider in no acute distress. She is alert, oriented x 4, calm, cooperative and attentive. Her mood is euthymic with flat affect. She has normal speech, her voice is very deep, and behavior is appropriate. Objectively there is no evidence of psychosis/mania or delusional thinking. Patient is able to converse coherently, goal directed thoughts, no distractibility, or pre-occupation. She endorses suicidal/self-harm behavior, but able to contract for safety. Denies homicidal ideation, psychosis, and paranoia.  Patient answered question  appropriately.    Past Psychiatric History: Schizophrenia, Bipolar, anxiety  [place] Scale:  Flowsheet Row ED from 08/12/2022 in [facility] ED from 05/03/2022 in [facility] from 12/27/2021 in [facility] EMERGENCY DEPARTMENT  C-SSRS RISK CATEGORY High Risk High Risk High Risk       Past Medical History:  Past Medical History:  Diagnosis Date   Asthma    Autism    GERD (gastroesophageal reflux disease)    Hypertension     Past Surgical History:  Procedure Laterality Date   CYSTECTOMY     DENTAL SURGERY     TONSILLECTOMY      Social History:  Social History   Substance and Sexual Activity  Alcohol Use No     Social History   Substance and Sexual Activity  Drug Use No    Social History   Socioeconomic History   Marital status: Single    Spouse name: Not on file   Number of children: Not on file   Years of education: Not on file   Highest education level: Not on file  Occupational History   Not on file  Tobacco Use   Smoking status: Never   Smokeless tobacco: Never  Vaping Use   Vaping Use: Never used  Substance and Sexual Activity   Alcohol use: No   Drug use: No   Sexual activity: Never  Other Topics Concern   Not on file  Social History Narrative   Not on file   Social Determinants of Health   Financial Resource Strain: Not on file  Food Insecurity: Not on file  Transportation Needs: Not on file  Physical Activity: Not on file  Stress: Not on file  Social Connections: Not on file    Sleep: Good  Appetite:  Good  Current Medications: Current Facility-Administered Medications  Medication Dose Route Frequency Provider Last Rate Last Admin   acetaminophen (TYLENOL) tablet 650 mg  650 mg Oral Q6H PRN Terrilee Files, MD   650 mg at 08/15/22 1233   ARIPiprazole (ABILIFY) tablet 15 mg  15 mg Oral q AM Terrilee Files, MD       cholecalciferol (VITAMIN D3)  25 MCG (1000 UNIT) tablet 4,000 Units  4,000 Units Oral Daily Terrilee Files, MD       lamoTRIgine (LAMICTAL) tablet 25 mg  25 mg Oral Daily Dahlia Byes C, NP   25 mg at 08/15/22 1020   loratadine (CLARITIN) tablet 10 mg  10 mg Oral Daily Terrilee Files, MD       mometasone-formoterol [facility]) 200-5 MCG/ACT inhaler 2 puff  2 puff Inhalation BID Terrilee Files, MD       ondansetron (ZOFRAN-ODT) disintegrating tablet 4 mg  4 mg Oral Q8H PRN Long, Arlyss Repress, MD   4 mg at 08/13/22 2308   QUEtiapine (SEROQUEL) tablet 400 mg  400 mg Oral QHS Terrilee Files, MD       QUEtiapine (SEROQUEL) tablet 50 mg  50 mg Oral BID Terrilee Files, MD       verapamil (CALAN) tablet 120 mg  120 mg Oral Q12H Terrilee Files, MD       verapamil (CALAN) tablet 40 mg  40 mg Oral Q1200 Terrilee Files, MD       Current Outpatient Medications  Medication Sig Dispense Refill   acetaminophen (TYLENOL) 160 MG/5ML suspension Take 640 mg by mouth every 4 (four) hours as needed for mild pain or fever.     albuterol (PROVENTIL HFA;VENTOLIN HFA) 108 (90 Base) MCG/ACT inhaler Inhale 2 puffs into the lungs every 6 (six) hours as needed for wheezing or shortness of breath (or coughing).     aluminum-magnesium hydroxide 200-200 MG/5ML suspension Take 15-30 mLs by mouth every 2 (two) hours as needed for indigestion (15 ml's if under 100 pounds and 30 ml's if over 100 pounds).     ARIPiprazole (ABILIFY) 15 MG tablet Take 15 mg by mouth in the morning.     bisacodyl (DULCOLAX) 10 MG suppository Place 10 mg rectally daily as needed for mild constipation (if no B/M in 72 hours).     budesonide-formoterol (SYMBICORT) 160-4.5 MCG/ACT inhaler Inhale 2 puffs into the lungs 2 (two) times daily.     Cholecalciferol (VITAMIN D3) 50 MCG (2000 UT) capsule Take 4,000 Units by mouth daily.     diphenhydrAMINE (BENADRYL) 12.5 MG/5ML elixir Take 25 mg by mouth every 4 (four) hours as needed for itching or allergies (or nasal  congestion).     guaifenesin (ROBITUSSIN) 100 MG/5ML syrup Take 300 mg by mouth every 4 (four) hours as needed for cough.     hydrocortisone cream 1 % Apply 1 Application topically as needed for itching (or bug bites, skin irritation, rashes, chapped skin- affected areas).     hydrOXYzine (VISTARIL) 50 MG capsule Take 50 mg by mouth every 8 (eight) hours as needed ("for behaviors not controlled by BSP lasting longer than 5 minutes").     lamoTRIgine (LAMICTAL) 25 MG tablet Take 50 mg by mouth in the morning.     loperamide (IMODIUM A-D) 2  MG tablet Take 2-4 mg by mouth See admin instructions. Take 4 mg by mouth for initial dose of diarrhea and 2 mg after each subsequent loose stool- cannot exceed a total of 16 mg/24 hours     loratadine (CLARITIN) 10 MG tablet Take 10 mg by mouth daily.     magnesium hydroxide (MILK OF MAGNESIA) 400 MG/5ML suspension Take 15-30 mLs by mouth daily as needed for mild constipation (15 ml's if under 100 pounds and 30 ml's if over 100 pounds- if no relief from Dulcolax suppository).     neomycin-bacitracin-polymyxin [phone] OINT Apply 1 Application topically See admin instructions. Apply twice daily to superficial abrasions and lacerations after cleansing. Cover with a dressing.     promethazine (PHENERGAN) 25 MG tablet Take 25 mg by mouth See admin instructions. Take 25 mg by mouth every 4-6 hours as needed for vomiting 2 or more times in four hours and call MD if symptoms persist     QUEtiapine (SEROQUEL) 400 MG tablet Take 400 mg by mouth at bedtime.     QUEtiapine (SEROQUEL) 50 MG tablet Take 50 mg by mouth See admin instructions. Take 50 mg by mouth in the morning and at noon     sodium phosphate (FLEET) 7-19 GM/118ML ENEM Place 1 enema rectally daily as needed (for constipation if no results from Dulcolax suppository and milk of magnesia).     verapamil (CALAN) 120 MG tablet Take 120 mg by mouth in the morning and at bedtime.     verapamil (CALAN) 40 MG tablet  Take 40 mg by mouth daily at 12 noon.     Vitamins A & D (VITAMIN A & D) ointment Apply 1 Application topically 3 (three) times daily as needed for dry skin (or skin irritation, rashes, or chapped skin).     mometasone-formoterol (DULERA) 100-5 MCG/ACT AERO Inhale 2 puffs into the lungs 2 (two) times daily. (Patient not taking: Reported on 08/14/2022) 1 Inhaler 1    Lab Results: No results found for this or any previous visit (from the past 48 hour(s)).  Blood Alcohol level:  Lab Results  Component Value Date   ETH <10 08/12/2022   ETH <10 12/27/2021    Physical Findings:  CIWA:    COWS:     Musculoskeletal: Strength & Muscle Tone: within normal limits Gait & Station: normal Patient leans: N/A  Psychiatric Specialty Exam:  Presentation  General Appearance:  Appropriate for Environment  Eye Contact: Good  Speech: Normal Rate  Speech Volume: Normal  Handedness: Right   Mood and Affect  Mood: Euthymic  Affect: Appropriate   Thought Process  Thought Processes: Coherent  Descriptions of Associations:Intact  Orientation:Full (Time, Place and Person)  Thought Content:WDL  History of Schizophrenia/Schizoaffective disorder:Yes  Duration of Psychotic Symptoms:Greater than six months  Hallucinations:Hallucinations: None Description of Auditory Hallucinations: voices telling her to hurt self  Ideas of Reference:None  Suicidal Thoughts:Suicidal Thoughts: No SI Active Intent and/or Plan: With Intent; With Plan; With Means to Carry Out; With Access to Means  Homicidal Thoughts:Homicidal Thoughts: No   Sensorium  Memory: Immediate Fair; Remote Fair  Judgment: Fair  Insight: Fair   Art therapistxecutive Functions  Concentration: Fair  Attention Span: Fair  Recall: FiservFair  Fund of Knowledge: Fair  Language: Fair   Psychomotor Activity  Psychomotor Activity: Psychomotor Activity: Normal   Assets  Assets: Manufacturing systems engineerCommunication Skills; Housing;  Social Support   Sleep  Sleep: Sleep: Good    Physical Exam: Physical Exam Vitals and nursing note  reviewed.  HENT:     Head: Normocephalic.  Pulmonary:     Effort: Pulmonary effort is normal.  Musculoskeletal:        General: Normal range of motion.     Cervical back: Normal range of motion.  Neurological:     Mental Status: She is alert.  Psychiatric:        Attention and Perception: Attention normal.        Mood and Affect: Mood normal.        Speech: Speech is delayed.        Behavior: Behavior normal. Behavior is cooperative.    Review of Systems  Constitutional: Negative.   Genitourinary: Negative.   Skin: Negative.   Psychiatric/Behavioral:  Positive for depression and suicidal ideas.    Blood pressure (!) 153/117, pulse (!) 111, temperature (!) 97.4 F (36.3 C), temperature source Oral, resp. rate 18, height 5\' 5"  (1.651 m), weight 68 kg, SpO2 100 %. Body mass index is 24.96 kg/m.   Medical Decision Making: 63 y.o., female patient presented to [facility] as a walk in accompanied by her group home staff member. Patient tried to kill herself with glass from a mirror. Cut her right wrist, stomach, left thigh. Patient states she has been feeling suicidal since thanksgiving. Continue to recommend inpatient psychiatric facility, RN spoke with patient legal guardian/ mother will sign voluntary consent, today after 1700.  Problem 1: Bipolar disorder: Will continue Abilify, Lamictal and Seroquel  Disposition: Recommend psychiatric Inpatient admission when medically cleared.    Khair Chasteen MOTLEY-MANGRUM, PMHNP 08/15/2022, 3:23 PM

## 2022-08-15 NOTE — ED Provider Notes (Signed)
Emergency Medicine Observation Re-evaluation Note  Brittany Palmer is a 26 y.o. female, seen on rounds today.  Pt initially presented to the ED for complaints of Suicide Attempt Currently, the patient is resting quietly.  Physical Exam  BP (!) 128/102 (BP Location: Left Arm)   Pulse 93   Temp 97.7 F (36.5 C) (Oral)   Resp 18   Ht 5\' 5"  (1.651 m)   Wt 68 kg   SpO2 99%   BMI 24.96 kg/m  Physical Exam General: No acute distress Cardiac: Well-perfused Lungs: Nonlabored Psych: Cooperative  ED Course / MDM  EKG:   I have reviewed the labs performed to date as well as medications administered while in observation.  Recent changes in the last 24 hours include psychiatry evaluation.  Plan  Current plan is for inpatient psychiatric placement.    , MD 08/15/22 [phone]

## 2022-08-15 NOTE — ED Notes (Signed)
RN called pager at [facility] and leave contact information for report.

## 2022-08-15 NOTE — ED Notes (Signed)
No callback received yet from [facility]. RN called and leave contact number again.

## 2022-08-15 NOTE — ED Notes (Signed)
Called [place] again. No callback yet received from [facility].

## 2022-08-16 ENCOUNTER — Encounter (HOSPITAL_COMMUNITY): Payer: Self-pay | Admitting: Psychiatry

## 2022-08-16 ENCOUNTER — Other Ambulatory Visit: Payer: Self-pay

## 2022-08-16 ENCOUNTER — Inpatient Hospital Stay (HOSPITAL_COMMUNITY)
Admission: AD | Admit: 2022-08-16 | Discharge: 2022-08-22 | DRG: 885 | Disposition: A | Discharge status: Home / Self Care | Payer: Medicare Other | Source: Intra-hospital | Attending: Psychiatry | Admitting: Psychiatry

## 2022-08-16 DIAGNOSIS — F840 Autistic disorder: Secondary | ICD-10-CM | POA: Diagnosis present

## 2022-08-16 DIAGNOSIS — J45909 Unspecified asthma, uncomplicated: Secondary | ICD-10-CM | POA: Diagnosis present

## 2022-08-16 DIAGNOSIS — Z818 Family history of other mental and behavioral disorders: Secondary | ICD-10-CM

## 2022-08-16 DIAGNOSIS — R519 Headache, unspecified: Secondary | ICD-10-CM | POA: Diagnosis present

## 2022-08-16 DIAGNOSIS — Z79899 Other long term (current) drug therapy: Secondary | ICD-10-CM

## 2022-08-16 DIAGNOSIS — R0789 Other chest pain: Secondary | ICD-10-CM | POA: Diagnosis present

## 2022-08-16 DIAGNOSIS — F333 Major depressive disorder, recurrent, severe with psychotic symptoms: Secondary | ICD-10-CM | POA: Diagnosis present

## 2022-08-16 DIAGNOSIS — Z888 Allergy status to other drugs, medicaments and biological substances status: Secondary | ICD-10-CM | POA: Diagnosis not present

## 2022-08-16 DIAGNOSIS — Z1152 Encounter for screening for COVID-19: Secondary | ICD-10-CM

## 2022-08-16 DIAGNOSIS — F419 Anxiety disorder, unspecified: Secondary | ICD-10-CM | POA: Diagnosis present

## 2022-08-16 DIAGNOSIS — K219 Gastro-esophageal reflux disease without esophagitis: Secondary | ICD-10-CM | POA: Diagnosis present

## 2022-08-16 DIAGNOSIS — I10 Essential (primary) hypertension: Secondary | ICD-10-CM | POA: Diagnosis present

## 2022-08-16 DIAGNOSIS — F315 Bipolar disorder, current episode depressed, severe, with psychotic features: Secondary | ICD-10-CM | POA: Diagnosis present

## 2022-08-16 DIAGNOSIS — F3130 Bipolar disorder, current episode depressed, mild or moderate severity, unspecified: Secondary | ICD-10-CM | POA: Diagnosis present

## 2022-08-16 DIAGNOSIS — Z9152 Personal history of nonsuicidal self-harm: Secondary | ICD-10-CM

## 2022-08-16 DIAGNOSIS — F940 Selective mutism: Secondary | ICD-10-CM | POA: Diagnosis present

## 2022-08-16 DIAGNOSIS — Z7951 Long term (current) use of inhaled steroids: Secondary | ICD-10-CM | POA: Diagnosis not present

## 2022-08-16 MED ORDER — QUETIAPINE FUMARATE 50 MG PO TABS
50.0000 mg | ORAL_TABLET | Freq: Two times a day (BID) | ORAL | Status: DC
  Administered 2022-08-17 – 2022-08-22 (×11): 50 mg via ORAL
  Filled 2022-08-16 (×17): qty 1

## 2022-08-16 MED ORDER — QUETIAPINE FUMARATE 400 MG PO TABS
400.0000 mg | ORAL_TABLET | Freq: Every day | ORAL | Status: DC
  Administered 2022-08-17 – 2022-08-21 (×5): 400 mg via ORAL
  Filled 2022-08-16 (×11): qty 1

## 2022-08-16 MED ORDER — VERAPAMIL HCL 40 MG PO TABS
40.0000 mg | ORAL_TABLET | Freq: Every day | ORAL | Status: DC
  Administered 2022-08-17 – 2022-08-21 (×5): 40 mg via ORAL
  Filled 2022-08-16 (×8): qty 1

## 2022-08-16 MED ORDER — ALBUTEROL SULFATE HFA 108 (90 BASE) MCG/ACT IN AERS
1.0000 | INHALATION_SPRAY | RESPIRATORY_TRACT | Status: DC | PRN
  Administered 2022-08-17 – 2022-08-21 (×4): 2 via RESPIRATORY_TRACT
  Filled 2022-08-16: qty 6.7

## 2022-08-16 MED ORDER — MOMETASONE FURO-FORMOTEROL FUM 200-5 MCG/ACT IN AERO
2.0000 | INHALATION_SPRAY | Freq: Two times a day (BID) | RESPIRATORY_TRACT | Status: DC
  Administered 2022-08-16 – 2022-08-22 (×12): 2 via RESPIRATORY_TRACT
  Filled 2022-08-16 (×3): qty 8.8

## 2022-08-16 MED ORDER — VITAMIN D3 25 MCG PO TABS
4000.0000 [IU] | ORAL_TABLET | Freq: Every day | ORAL | Status: DC
  Administered 2022-08-17 – 2022-08-22 (×6): 4000 [IU] via ORAL
  Filled 2022-08-16 (×9): qty 4

## 2022-08-16 MED ORDER — LORATADINE 10 MG PO TABS
10.0000 mg | ORAL_TABLET | Freq: Every day | ORAL | Status: DC
  Administered 2022-08-17 – 2022-08-22 (×6): 10 mg via ORAL
  Filled 2022-08-16 (×9): qty 1

## 2022-08-16 MED ORDER — ALUM & MAG HYDROXIDE-SIMETH 200-200-20 MG/5ML PO SUSP
30.0000 mL | ORAL | Status: DC | PRN
  Administered 2022-08-18: 30 mL via ORAL
  Filled 2022-08-16: qty 30

## 2022-08-16 MED ORDER — VERAPAMIL HCL 40 MG PO TABS
120.0000 mg | ORAL_TABLET | Freq: Two times a day (BID) | ORAL | Status: DC
  Administered 2022-08-16 – 2022-08-22 (×12): 120 mg via ORAL
  Filled 2022-08-16: qty 1
  Filled 2022-08-16: qty 3
  Filled 2022-08-16 (×5): qty 1
  Filled 2022-08-16: qty 3
  Filled 2022-08-16 (×3): qty 1
  Filled 2022-08-16: qty 3
  Filled 2022-08-16: qty 1
  Filled 2022-08-16 (×4): qty 3
  Filled 2022-08-16 (×3): qty 1

## 2022-08-16 MED ORDER — QUETIAPINE FUMARATE 100 MG PO TABS
ORAL_TABLET | ORAL | Status: AC
  Administered 2022-08-16: 400 mg via ORAL
  Filled 2022-08-16: qty 4

## 2022-08-16 MED ORDER — MAGNESIUM HYDROXIDE 400 MG/5ML PO SUSP
30.0000 mL | Freq: Every day | ORAL | Status: DC | PRN
  Filled 2022-08-16: qty 30

## 2022-08-16 MED ORDER — HYDROXYZINE HCL 25 MG PO TABS
25.0000 mg | ORAL_TABLET | Freq: Three times a day (TID) | ORAL | Status: DC | PRN
  Administered 2022-08-17 – 2022-08-20 (×5): 25 mg via ORAL
  Filled 2022-08-16 (×5): qty 1

## 2022-08-16 MED ORDER — ARIPIPRAZOLE 15 MG PO TABS
15.0000 mg | ORAL_TABLET | Freq: Every morning | ORAL | Status: DC
  Administered 2022-08-17: 15 mg via ORAL
  Filled 2022-08-16 (×3): qty 1

## 2022-08-16 MED ORDER — LAMOTRIGINE 25 MG PO TABS
25.0000 mg | ORAL_TABLET | Freq: Every day | ORAL | Status: DC
  Administered 2022-08-17: 25 mg via ORAL
  Filled 2022-08-16 (×3): qty 1

## 2022-08-16 MED ORDER — ACETAMINOPHEN 325 MG PO TABS: 650.0000 mg | ORAL_TABLET | Freq: Four times a day (QID) | ORAL | Status: DC | PRN

## 2022-08-16 MED ORDER — ONDANSETRON 4 MG PO TBDP: 4.0000 mg | ORAL_TABLET | Freq: Three times a day (TID) | ORAL | Status: DC | PRN

## 2022-08-16 NOTE — Tx Team (Signed)
Initial Treatment Plan 08/16/2022 8:16 PM [name] AST:[id number]    PATIENT STRESSORS: Other: parents     PATIENT STRENGTHS: Printmaker for treatment/growth  Physical Health    PATIENT IDENTIFIED PROBLEMS: Suicidal ideation    A/VH    "I felt like my parents were judging me"             DISCHARGE CRITERIA:  Improved stabilization in mood, thinking, and/or behavior Need for constant or close observation no longer present Reduction of life-threatening or endangering symptoms to within safe limits Verbal commitment to aftercare and medication compliance  PRELIMINARY DISCHARGE PLAN: Attend aftercare/continuing care group Outpatient therapy Return to previous living arrangement  PATIENT/FAMILY INVOLVEMENT: This treatment plan has been presented to and reviewed with the patient, Brittany Palmer, .  The patient has been given the opportunity to ask questions and make suggestions.  Shela Nevin, RN 08/16/2022, 8:16 PM

## 2022-08-16 NOTE — ED Notes (Signed)
Pt lying in bed respirations easy appear to asleep no signs of sleep disturbance or distress.

## 2022-08-16 NOTE — [facility] Group Notes (Signed)
Pt attended AA Group.  

## 2022-08-16 NOTE — Progress Notes (Signed)
Patient is a 26 year old female who presented to [facility] under IVC for SI with a plan to slit throat with glass. Pt attempted to cut her right wrist, stomach and bilat thighs, but did so superficially. During skin assessment, the words "Help me" were noted- superficially cut onto her left upper thigh. Pt has a hx significant for schizoaffective disorder, MDD, and Autism Spectrum Disorder. Pt currently endorses passive SI- agrees to contact staff before acting on any harmful thoughts. Pt endorses occasional  A/VH that is "worse at night" (sees ghosts, crosses and birds), and stated , "I sometimes feel things touch me." Patient presented with flat affect and calm, cooperative behavior during admission interview and assessment. VS monitored and recorded. Skin check performed with RN and revealed superficial lacerations bilat thighs, stomach and right inner wrist. Belongings searched and secured in locker. Admission paperwork completed and signed. Patient was oriented to unit and schedule.   PO fluids and snack provided. Q 15 min checks initiated for safety.

## 2022-08-16 NOTE — ED Notes (Signed)
Received call from [place] at [facility], consent forms were not signed correctly for pt to come to [facility]. She stated if we can IVC pt they will hold the bed otherwise the bed would not be held. Writer spoke with Dr Theresia Lo who is in the process of filling out the IVC.   Everlena Cooper at [facility] has been updated, Everlena Cooper provided the fax number when we receive the signed paperwork to send a copy. [phone] Fax  Mica # [phone]

## 2022-08-16 NOTE — Progress Notes (Signed)
Pt was accepted to [facility] TODAY 08/16/2022, pending updated EKG. IVC can be faxed to [phone]. Bed assignment: 302-2.  Pt meets inpatient criteria per Nira Conn, NP  Attending Physician will be Phineas Inches, MD   Report can be called to: -Adult unit: [phone]  Pt can arrive at 12-12:30 PM  Care Team Notified: [facility] Malva Limes, RN, Lum Babe, RN, Alona Bene, PMHNP, Erick Alley, RN, and [address] LCSWA  [place], [place]  08/16/2022 11:20 AM

## 2022-08-16 NOTE — ED Notes (Signed)
Patient off unit to [facility] per provider. Patient alert, calm, cooperative, no s/s of distress at this times. Patient discharge information and belongings given to [employer] for transport. Patient ambulatory off unit, escorted and transported by [employer].

## 2022-08-16 NOTE — ED Provider Notes (Signed)
Emergency Medicine Observation Re-evaluation Note  Brittany Palmer is a 26 y.o. female, seen on rounds today.  Pt initially presented to the ED for complaints of Suicide Attempt Currently, the patient is awake and alert requesting a shower.  Physical Exam  BP 131/87 (BP Location: Left Arm)   Pulse (!) 114   Temp 97.6 F (36.4 C) (Oral)   Resp 18   Ht 5\' 5"  (1.651 m)   Wt 68 kg   SpO2 99%   BMI 24.96 kg/m  Physical Exam General: Awake and alert in no acute distress Cardiac: mildly tachycardic Lungs: no increased WOB Psych: calm, cooperative  ED Course / MDM  EKG:   I have reviewed the labs performed to date as well as medications administered while in observation.  Recent changes in the last 24 hours include accepted to [facility] inpatient psych facility .  Plan  Current plan is for Inpatient psych.    [facility], DO 08/16/22 [phone]

## 2022-08-16 NOTE — ED Notes (Signed)
Call to [facility] assessment staff unavailable message left on answer machine.

## 2022-08-17 DIAGNOSIS — F333 Major depressive disorder, recurrent, severe with psychotic symptoms: Secondary | ICD-10-CM | POA: Diagnosis present

## 2022-08-17 LAB — LIPID PANEL
Cholesterol: 128 mg/dL (ref 0–200)
HDL: 45 mg/dL (ref >40)
LDL Cholesterol: 72 mg/dL (ref 0–99)
Total CHOL/HDL Ratio: 2.8 RATIO
Triglycerides: 55 mg/dL (ref <150)
VLDL: 11 mg/dL (ref 0–40)

## 2022-08-17 LAB — TSH: TSH: 2.324 u[IU]/mL (ref 0.350–4.500)

## 2022-08-17 MED ORDER — PROPRANOLOL HCL 10 MG PO TABS
10.0000 mg | ORAL_TABLET | Freq: Two times a day (BID) | ORAL | Status: DC
  Filled 2022-08-17 (×2): qty 1

## 2022-08-17 MED ORDER — VENLAFAXINE HCL ER 75 MG PO CP24
75.0000 mg | ORAL_CAPSULE | Freq: Every day | ORAL | Status: DC
  Administered 2022-08-18 – 2022-08-22 (×5): 75 mg via ORAL
  Filled 2022-08-17 (×8): qty 1

## 2022-08-17 MED ORDER — PROPRANOLOL HCL 10 MG PO TABS
10.0000 mg | ORAL_TABLET | Freq: Two times a day (BID) | ORAL | Status: DC
  Administered 2022-08-17: 10 mg via ORAL
  Filled 2022-08-17 (×5): qty 1

## 2022-08-17 NOTE — BHH Counselor (Signed)
Adult Comprehensive Assessment  Patient ID: Brittany Palmer, female   DOB: 1996/04/29, 26 y.o.   MRN: [id number]  Information Source: Information source: Patient  Current Stressors:  Patient states their primary concerns and needs for treatment are:: Suicidal thoughts, cutting, hallucinations, depression Patient states their goals for this hospitilization and ongoing recovery are:: Get diagnosed, get medication adjustments Educational / Learning stressors: Denies stressors Employment / Job issues: Denies stressors Family Relationships: Feels that her adoptive parents are judgmental.  Her mother is her legal guardian, and she would like to change this.  Mother has been her legal guardian since she became 26yo. Financial / Lack of resources (include bankruptcy): Denies stressors Housing / Lack of housing: Denies stressors Physical health (include injuries & life threatening diseases): Denies stressors but does complain about her hallucinations Social relationships: Denies stressors Substance abuse: Denies stressors Bereavement / Loss: States her 18yo sister died by suicide in 11/14/2016 and this still bothers her  Living/Environment/Situation:  Living Arrangements: Group Home Living conditions (as described by patient or guardian): [facility], [place] Who else lives in the home?: 5 other residents How long has patient lived in current situation?: 3 years What is atmosphere in current home: Comfortable, Supportive  Family History:  Marital status: Long term relationship Long term relationship, how long?: 2 years What types of issues is patient dealing with in the relationship?: No issues -- boyfriend lives in another group home Does patient have children?: No  Childhood History:  By whom was/is the patient raised?: Both parents, Adoptive parents Additional childhood history information: Patient states that she was with her biological parents until the age of 64. Patient has been  with adoptive parents since 26yo.  Record shows that mother states the patient was removed from the adoptive home at age 13yo due to uncontrollable behaviors such as cutting and biting herself. Description of patient's relationship with caregiver when they were a child: pretty good Patient's description of current relationship with people who raised him/her: Feels that her adoptive parents are judgmental of her.  Her adoptive mother is her Legal Guardian, and she would like to change this. How were you disciplined when you got in trouble as a child/adolescent?: spanking, removing priveleges Does patient have siblings?: Yes Number of Siblings: 7 Description of patient's current relationship with siblings: sister committed suicide in 03/15/2018has 6 brothers. She has a good relationship with her brothers Did patient suffer any verbal/emotional/physical/sexual abuse as a child?: No Did patient suffer from severe childhood neglect?: No Has patient ever been sexually abused/assaulted/raped as an adolescent or adult?: No Was the patient ever a victim of a crime or a disaster?: No Witnessed domestic violence?: No Has patient been affected by domestic violence as an adult?: No  Education:  Highest grade of school patient has completed: High school Currently a student?: No Learning disability?: Yes What learning problems does patient have?: Autism  Employment/Work Situation:   Employment Situation: On disability Why is Patient on Disability: She is unsure why she is on disability.  Record shows that mother states she has IDD and Autism. How Long has Patient Been on Disability: Since age 83yo What is the Longest Time Patient has Held a Job?: Pt has not had employment Has Patient ever Been in the [employer]?: No  Financial Resources:   Financial resources: [employer], Medicare Does patient have a Lawyer or guardian?: Yes Name of representative payee or guardian: Mother Brittany Palmer  is both Teacher, music  [phone]  Alcohol/Substance Abuse:   What has been your use of drugs/alcohol within the last 12 months?: Denies substance use If attempted suicide, did drugs/alcohol play a role in this?: No Alcohol/Substance Abuse Treatment Hx: Denies past history Has alcohol/substance abuse ever caused legal problems?: No  Social Support System:   Conservation officer, nature Support System: Fair Development worker, community Support System: Friends  Leisure/Recreation:   Do You Have Hobbies?: Yes Leisure and Hobbies: Playing the piano, listening to music  Strengths/Needs:   What is the patient's perception of their strengths?: Technology, can play the piano Patient states they can use these personal strengths during their treatment to contribute to their recovery: Is not sure Patient states these barriers may affect/interfere with their treatment: Denies Patient states these barriers may affect their return to the community: Denies Other important information patient would like considered in planning for their treatment: Denies  Discharge Plan:   Currently receiving community mental health services: Yes (From Whom) (States she is in some type of day program with [facility] and she sees the psychiatrist there.  She does not have a therapist.) Patient states concerns and preferences for aftercare planning are: Continue with current psychiatrist, possibly through [facility] who sees her at the day program.  Would like a therapist. Patient states they will know when they are safe and ready for discharge when: Not sure Does patient have access to transportation?: Yes (Group home) Does patient have financial barriers related to discharge medications?: No Will patient be returning to same living situation after discharge?: Yes  Summary/Recommendations:   Summary and Recommendations (to be completed by the evaluator): Patient is a 26yo female who is hospitalized after trying to  cut her right wrist, stomach, and bilateral thighs recently, using glass from a mirror.  She lives in [facility] and did not tell staff there about her self-harm, but did finally tell staff at the day program she attends through [employer].  She was adopted at age 24yo but removed from that home at age 25yo because of uncontrollable self-harm behaviors.  Her adoptive mother Brittany Palmer is her Legal Guardian.  She is on disability for IDD and Autism Spectrum disorder, with mother also acting as her Lawyer.  She is experiencing auditory hallucinations telling her to kill herself.  She has had multiple hospitalizations, including at [facility] and at [facility] previously.  She sees a psychiatrist through her [facility] day program and is interested in having therapy set up.  She will return to live at the group home at discharge, if that is acceptable to the group home.    Patient would benefit from group therapy, medication management, psychoeducation, crisis stabilization, peer support and discharge planning.  At discharge it is recommended that the patient adhere to the established aftercare plan.  Brittany Palmer. 08/17/2022

## 2022-08-17 NOTE — [facility] Group Notes (Signed)
Goals Group 08/17/22   Group Focus: affirmation, clarity of thought, and goals/reality orientation Treatment Modality:  Psychoeducation Interventions utilized were assignment, group exercise, and support Purpose: To be able to understand and verbalize the reason for their admission to the hospital. To understand that the medication helps with their chemical imbalance but they also need to work on their choices in life. To be challenged to develop a list of 30 positives about themselves. Also introduce the concept that "feelings" are not reality.  Participation Level:  Active  Participation Quality:  Appropriate  Affect:  Appropriate  Cognitive:  Appropriate  Insight:  Improving  Engagement in Group:  Engaged  Additional Comments: Rates energy at a 0/10  Brittany Palmer A

## 2022-08-17 NOTE — Progress Notes (Signed)
Nursing Note: 0700-1900    Goal for today: "Talking to people more."   Pt reports that she slept "good" last night, appetite is "good" and she is tolerating prescribed medication without side effects.  Rates that hopelessness and depression are 10/10, anxiety is 5/10. Pt shared that she continues to experience intrusive thoughts throughout the shift, she denies AVH and is able to verbally contract for safety. Pt also shared that her asthma causes her to lose her voice and that she might need to write things down if this worsens. Pt was present and actively participated in groups. Remained pleasant and cooperative throughout the day, observed pt become anxious and restless in the afternoon. She received PRN Vistaril 25mg  at 1316 for anxiety.  Pt. encouraged to verbalize needs and concerns, active listening and support provided.  Continued Q 15 minute safety checks.

## 2022-08-17 NOTE — [facility] Group Notes (Signed)
Psychoeducational Group Note    Date:  08/17/2022 Time:1300-1400    Purpose of Group: . The group focus' on teaching patients on how to identify their needs and their Life Skills:  A group where two lists are made. What people need and what are things that we do that are unhealthy. The lists are developed by the patients and it is explained that we often do the actions that are not healthy to get our list of needs met.  Goal:: to develop the coping skills needed to get their needs met  Participation Level:  Active  Participation Quality:  Appropriate  Affect:  Appropriate  Cognitive:  Oriented  Insight: Improving  Engagement in Group:  Engaged  Modes of Intervention:  Activity, Discussion, Education, and Support  Additional Comments:  Reports that her energy level is a 0/10. Did participate in the group.  Dione Housekeeper

## 2022-08-17 NOTE — Progress Notes (Signed)
Pt rates depression 0/10 and anxiety 0/10. Pt reports a fair  appetite, and no  physical problems. Patient received verapamil off scheduled during this shift due to pharmacy delivering medication late and medication not available  in pyxis charge nurse notified an made aware Pt denies SI/HI/AVH and verbally contracts for safety. Provided support and encouragement. Pt safe on the unit. Q 15 minute safety checks continued.

## 2022-08-17 NOTE — BHH Suicide Risk Assessment (Signed)
Suicide Risk Assessment  Admission Assessment    [facility] Admission Suicide Risk Assessment   Nursing information obtained from:  Patient  Demographic factors:  Caucasian, Low socioeconomic status, Unemployed, Adolescent or young adult  Current Mental Status:  Suicidal ideation indicated by patient  Loss Factors:  NA  Historical Factors:  Impulsivity  Risk Reduction Factors:  NA, Living with another person, especially a relative  Total Time spent with patient: 1 hour  Principal Problem: Bipolar affect, depressed (HCC)  Diagnosis:  Principal Problem:   Bipolar affect, depressed (HCC)  Subjective Data: See H&P  Continued Clinical Symptoms:  Alcohol Use Disorder Identification Test Final Score (AUDIT): 0 The "Alcohol Use Disorders Identification Test", Guidelines for Use in Primary Care, Second Edition.  World Science writer [facility]). Score between 0-7:  no or low risk or alcohol related problems. Score between 8-15:  moderate risk of alcohol related problems. Score between 16-19:  high risk of alcohol related problems. Score 20 or above:  warrants further diagnostic evaluation for alcohol dependence and treatment.  CLINICAL FACTORS:   Depression:   Impulsivity Currently Psychotic Previous Psychiatric Diagnoses and Treatments   Musculoskeletal: Strength & Muscle Tone: within normal limits Gait & Station: normal Patient leans: N/A  Psychiatric Specialty Exam:  Presentation  General Appearance:  Casual; Fairly Groomed  Eye Contact: Fair  Speech: Clear and Coherent; Normal Rate (Presents with a deep voice (boy-like).)  Speech Volume: Normal  Handedness: Right   Mood and Affect  Mood: Depressed; Anxious (Rates depression #10, anxiety #5. (scale of 1-10).)  Affect: Restricted   Thought Process  Thought Processes: Coherent; Goal Directed  Descriptions of Associations:Intact  Orientation:Full (Time, Place and Person)  Thought Content:Logical;  Rumination  History of Schizophrenia/Schizoaffective disorder:Yes  Duration of Psychotic Symptoms:Greater than six months  Hallucinations:Hallucinations: Auditory Description of Auditory Hallucinations: Whispering voice at night.  Ideas of Reference:None  Suicidal Thoughts:Suicidal Thoughts: Yes, Passive (Currently denies any plans or intent to hurt herself.) SI Active Intent and/or Plan: Without Intent; Without Plan; Without Means to Carry Out; Without Access to Means SI Passive Intent and/or Plan: Without Intent; Without Means to Carry Out; Without Plan; Without Access to Means  Homicidal Thoughts:Homicidal Thoughts: No   Sensorium  Memory: Immediate Good; Recent Fair; Remote Fair  Judgment: Impaired  Insight: Shallow   Executive Functions  Concentration: Fair  Attention Span: Fair  Recall: Fair  Fund of Knowledge: Other (comment) (Limited)  Language: Good  Psychomotor Activity  Psychomotor Activity:Psychomotor Activity: Normal   Assets  Assets: Communication Skills; Desire for Improvement; Financial Resources/Insurance; Physical Health; Housing; Resilience; Social Support  Sleep  Sleep:Sleep: Good Number of Hours of Sleep: 6   Physical Exam: See H&P.  Blood pressure (!) 149/99, pulse (!) 129, temperature 97.7 F (36.5 C), temperature source Oral, resp. rate 16, height 5\' 5"  (1.651 m), weight 67.8 kg, SpO2 97 %. Body mass index is 24.88 kg/m.  COGNITIVE FEATURES THAT CONTRIBUTE TO RISK:  Closed-mindedness, Loss of executive function, Polarized thinking, and Thought constriction (tunnel vision)    SUICIDE RISK:   Severe:  Frequent, intense, and enduring suicidal ideation, specific plan, no subjective intent, but some objective markers of intent (i.e., choice of lethal method), the method is accessible, some limited preparatory behavior, evidence of impaired self-control, severe dysphoria/symptomatology, multiple risk factors present, and few if any  protective factors, particularly a lack of social support.  PLAN OF CARE: See H&P.  I certify that inpatient services furnished can reasonably be expected to improve the patient's  condition.   Lindell Spar, NP, pmhnp, fnp-bc. 08/17/2022, 2:49 PM

## 2022-08-17 NOTE — Progress Notes (Signed)
The patient rated her day as a 4 out of 10 because she felt depressed and is trying to "find herself". Her goal for tomorrow is to obtain her diagnosis.

## 2022-08-17 NOTE — Progress Notes (Addendum)
Patient noted to have a fine rash anterior neck, bilateral hands with complaints of mild itching. Pt's first dose of propanolol given at 1427 this afternoon. Prn vistaril given Oncoming nurse made aware.

## 2022-08-17 NOTE — Group Note (Signed)
LCSW Group Therapy Note  08/17/2022      Type of Therapy and Topic:  Group Therapy: Gratitude   Description:   Group could not be held by social worker, but licensed RN did provide group.  A handout was given to each patient, with the following information:   Gratitude  "Acknowledging the good that you already have in your life is the foundation for all abundance." - Eckhart Tolle  " 'Enough' is a feast." - Buddhist Proverb  "Gratitude sweetens even the smallest moments."  "It is not joy that makes us grateful; It is gratitude that makes us joyful." - David Steindl-Rast    Put at least one response under each category of something for which you are grateful:  People:  Experiences:  Things:  Places:  Skills:  Other:  Add more responses as you get ideas from other people.   Therapeutic Modalities:   Activity  Brittany Palmer J Grossman-Orr, LCSW  

## 2022-08-17 NOTE — B[facility] Group Notes (Signed)
[facility] Group Notes:  (Nursing/MHT/Case Management/Adjunct)  Date:  08/17/2022  Time:  10:33 AM  Type of Therapy:  Psychoeducational Skills  Participation Level:  Active  Participation Quality:  Appropriate, Attentive, Sharing, and Supportive  Affect:  Anxious, Appropriate, and Flat  Cognitive:  Alert, Appropriate, Delusional, and Lacking  Insight:  Limited  Engagement in Group:  Developing/Improving and Engaged  Modes of Intervention:  Discussion, Education, Exploration, Problem-solving, Rapport Building, Socialization, and Support  Summary of Progress/Problems: The group was encouraged to think about having a magic wand to fix one major problem/obstacle in their lives and how this wish related to hospitalization and current goal. Pt shared that she grew up with controlling parents that didn't accept her as she was. Pt also shared that she didn't feel herself and believed that she might controlled by a demon or dark spirits.   Karren Burly 08/17/2022, 10:33 AM

## 2022-08-17 NOTE — H&P (Signed)
Psychiatric Admission Assessment Adult  Patient Identification: Brittany Palmer  MRN:  [id number]  Date of Evaluation:  08/17/2022  Chief Complaint: Worsening suicidal ideations with self mutilating behaviors.  Principal Diagnosis: Severe recurrent major depressive disorder with psychotic features (HCC)  Diagnosis:  Principal Problem:   Severe recurrent major depressive disorder with psychotic features (HCC) Active Problems:   Bipolar affect, depressed (HCC)  History of Present Illness: This is one of several psychiatric admission assessments in this [facility]/other [facility] systems for this 26 year old Caucasian female. Patient is known in this Brittany [facility] from her previous admissions for mood stabilization treatments. She does have hx of autism spectrum & currently resides in a group home setting. Prior to this admission, she was receiving mental health care on outpatient basis at the [facility] in [place], [place]. She is admitted to the [facility] this time around with complain of worsening symptoms of depression & self-mutilating behaviors. She was brought to the [facility] for evaluation & mood stabilization treatments. During this evaluation, Brittany Palmer reports,   "I was taken to the [facility] ED by one of my group home staff on the 11th of this month. I was having suicidal ideations, depressed & having hallucinations. These symptoms started around thanksgiving time because I felt like my parents were very judgmental towards me. In October of this year, I had to go to the ED in a [facility] in [facility] because  my asthma was acting-up & my heart rate was high. While at the ED, my father blamed me that I was having problems with my health because I was dressing like gothic. I'm not feeling good right now because I'm still depressed still. I have chest pain from time to time. It comes & goes but it is bearable. I came here for you guys to help me by giving me the right diagnosis. My depression started at 8. At the  time, I was on medication. I was sent to the [facility]. While at this program, I was made to stop taking medicines because the program was christian-based & they do not believe in medication. I was in this program for 6 years. After I came out of this program, I lived in a group home in [place], [place] for a while. Later, I went home to live with my parents. Now, I live in the [facility] group in the last 3 years. I do hear voices at night, they are more or less demonic whispering voices. I'm unable to make out what the voices were saying. When my depression symptoms got worse, I started cutting on my wrist, abdomen & thighs. The last time I cut was last Saturday. I cut to deal with my depression & suicidal ideations. I used a piece of glass from a broken mirror, I guess. I'm still feeling suicidal & depressed. My depression at this time is at #10 & anxiety #5. I hear voices only at night. I did see a ghostly figure last night looking at me. I feel safe here".  Associated Signs/Symptoms:  Depression Symptoms:  depressed mood, recurrent thoughts of death, suicidal thoughts with specific plan,  (Hypo) Manic Symptoms:  Hallucinations, Impulsivity, Labiality of Mood,  Anxiety Symptoms:  Excessive Worry,  Psychotic Symptoms:  Hallucinations: Auditory  PTSD Symptoms: NA  Total Time spent with patient: 1 hour  Past Psychiatric History: Major depressive disorder, Autism spectrum, Schizophrenia, Bipolar disorder..  Is the patient at risk to self? Yes.    Has the patient been  a risk to self in the past 6 months? Yes.    Has the patient been a risk to self within the distant past? Yes.    Is the patient a risk to others? No.  Has the patient been a risk to others in the past 6 months? No.  Has the patient been a risk to others within the distant past? No.   Brittany Palmer Scale:  [facility] Admission (Current) from 08/16/2022 in [facility] ED from  08/12/2022 in [facility] DEPT ED from 05/03/2022 in [facility] DEPT  C-SSRS RISK CATEGORY High Risk High Risk High Risk      Prior Inpatient Therapy: Yes.   If yes, describe: [facility] c numerous times.   Prior Outpatient Therapy: Yes.   If yes, describe: Patient is linked to [facility] in [place], [place].   Alcohol Screening: 1. How often do you have a drink containing alcohol?: Never 2. How many drinks containing alcohol do you have on a typical day when you are drinking?: 1 or 2 3. How often do you have six or more drinks on one occasion?: Never AUDIT-C Score: 0 4. How often during the last year have you found that you were not able to stop drinking once you had started?: Never 5. How often during the last year have you failed to do what was normally expected from you because of drinking?: Never 6. How often during the last year have you needed a first drink in the morning to get yourself going after a heavy drinking session?: Never 7. How often during the last year have you had a feeling of guilt of remorse after drinking?: Never 8. How often during the last year have you been unable to remember what happened the night before because you had been drinking?: Never 9. Have you or someone else been injured as a result of your drinking?: No 10. Has a relative or friend or a doctor or another health worker been concerned about your drinking or suggested you cut down?: No Alcohol Use Disorder Identification Test Final Score (AUDIT): 0  Substance Abuse History in the last 12 months:  No.  Consequences of Substance Abuse: NA  Previous Psychotropic Medications:  Yes, Abilify, Seroquel.  Psychological Evaluations: No   Past Medical History:  Past Medical History:  Diagnosis Date   Asthma    Autism    GERD (gastroesophageal reflux disease)    Hypertension     Past Surgical History:  Procedure Laterality Date    CYSTECTOMY     DENTAL SURGERY     TONSILLECTOMY     Family History: History reviewed. No pertinent family history.  Family Psychiatric  History: Mental illness: Sister.                                                   Completed suicide: Sister.  Tobacco Screening: NA. Social History   Tobacco Use  Smoking Status Never  Smokeless Tobacco Never    BH Tobacco Counseling     Are you interested in Tobacco Cessation Medications?  N/A, patient does not use tobacco products Counseled patient on smoking cessation:  N/A, patient does not use tobacco products Reason Tobacco Screening Not Completed: No value filed.       Social History: Single,  has no children, disabled, collects monthly SSI, resides in a group home.  Social History   Substance and Sexual Activity  Alcohol Use No     Social History   Substance and Sexual Activity  Drug Use No    Additional Social History:  Allergies:   Allergies  Allergen Reactions   Fluoxetine Rash and Other (See Comments)    Patient reports allergy and rash across neck when trying this medication   Lisinopril Cough   Risperidone Other (See Comments)    Twitching of mouth   Lab Results:  Results for orders placed or performed during the [facility] encounter of 08/16/22 (from the past 48 hour(s))  Lipid panel     Status: None   Collection Time: 08/17/22  6:49 AM  Result Value Ref Range   Cholesterol 128 0 - 200 mg/dL   Triglycerides 55 <150 mg/dL   HDL 45 >40 mg/dL   Total CHOL/HDL Ratio 2.8 RATIO   VLDL 11 0 - 40 mg/dL   LDL Cholesterol 72 0 - 99 mg/dL    Comment:        Total Cholesterol/HDL:CHD Risk Coronary Heart Disease Risk Table                     Men   Women  1/2 Average Risk   3.4   3.3  Average Risk       5.0   4.4  2 X Average Risk   9.6   7.1  3 X Average Risk  23.4   11.0        Use the calculated Patient Ratio above and the CHD Risk Table to determine the patient's CHD Risk.        ATP III CLASSIFICATION  (LDL):  <100     mg/dL   Optimal  100-129  mg/dL   Near or Above                    Optimal  130-159  mg/dL   Borderline  160-189  mg/dL   High  >190     mg/dL   Very High Performed at [facility] [address], [place], [place] [address]   TSH     Status: None   Collection Time: 08/17/22  6:49 AM  Result Value Ref Range   TSH 2.324 0.350 - 4.500 uIU/mL    Comment: Performed by a 3rd Generation assay with a functional sensitivity of <=0.01 uIU/mL. Performed at [facility], [place] [address] [place], [place] [address]    Blood Alcohol level:  Lab Results  Component Value Date   ETH <10 08/12/2022   ETH <10 [id number]   Metabolic Disorder Labs:  Lab Results  Component Value Date   HGBA1C 5.2 05/19/2018   MPG 103 05/19/2018   MPG 96.8 09/11/2017   Lab Results  Component Value Date   PROLACTIN 30.2 04/12/2011   Lab Results  Component Value Date   CHOL 128 08/17/2022   TRIG 55 08/17/2022   HDL 45 08/17/2022   CHOLHDL 2.8 08/17/2022   VLDL 11 08/17/2022   LDLCALC 72 08/17/2022   LDLCALC 56 05/19/2018   Current Medications: Current Facility-Administered Medications  Medication Dose Route Frequency Provider Last Rate Last Admin   acetaminophen (TYLENOL) tablet 650 mg  650 mg Oral Q6H PRN Motley-Mangrum, Jadeka A, PMHNP       albuterol (VENTOLIN HFA) 108 (90 Base) MCG/ACT inhaler 1-2 puff  1-2 puff  Inhalation Q4H PRN Nicholes Rough, NP   2 puff at 08/17/22 0820   alum & mag hydroxide-simeth (MAALOX/MYLANTA) 200-200-20 MG/5ML suspension 30 mL  30 mL Oral Q4H PRN Motley-Mangrum, Jadeka A, PMHNP       hydrOXYzine (ATARAX) tablet 25 mg  25 mg Oral TID PRN Motley-Mangrum, Jadeka A, PMHNP   25 mg at 08/17/22 1316   loratadine (CLARITIN) tablet 10 mg  10 mg Oral Daily Motley-Mangrum, Jadeka A, PMHNP   10 mg at 08/17/22 0817   magnesium hydroxide (MILK OF MAGNESIA) suspension 30 mL  30 mL Oral Daily PRN Motley-Mangrum, Jadeka A, PMHNP        mometasone-formoterol (DULERA) 200-5 MCG/ACT inhaler 2 puff  2 puff Inhalation BID Motley-Mangrum, Jadeka A, PMHNP   2 puff at 08/17/22 0817   ondansetron (ZOFRAN-ODT) disintegrating tablet 4 mg  4 mg Oral Q8H PRN Motley-Mangrum, Jadeka A, PMHNP       propranolol (INDERAL) tablet 10 mg  10 mg Oral BID Lindell Spar I, NP   10 mg at 08/17/22 1427   QUEtiapine (SEROQUEL) tablet 400 mg  400 mg Oral QHS Motley-Mangrum, Jadeka A, PMHNP   400 mg at 08/16/22 2203   QUEtiapine (SEROQUEL) tablet 50 mg  50 mg Oral BID Motley-Mangrum, Jadeka A, PMHNP   50 mg at 08/17/22 1253   [START ON 08/18/2022] venlafaxine XR (EFFEXOR-XR) 24 hr capsule 75 mg  75 mg Oral Q breakfast Makaylie Dedeaux I, NP       verapamil (CALAN) tablet 120 mg  120 mg Oral Q12H Motley-Mangrum, Jadeka A, PMHNP   120 mg at 08/17/22 1052   verapamil (CALAN) tablet 40 mg  40 mg Oral Q1200 Motley-Mangrum, Jadeka A, PMHNP   40 mg at 08/17/22 1258   vitamin D3 (CHOLECALCIFEROL) tablet 4,000 Units  4,000 Units Oral Daily Motley-Mangrum, Jadeka A, PMHNP   4,000 Units at 08/17/22 0817   PTA Medications: Medications Prior to Admission  Medication Sig Dispense Refill Last Dose   acetaminophen (TYLENOL) 160 MG/5ML suspension Take 640 mg by mouth every 4 (four) hours as needed for mild pain or fever.      albuterol (PROVENTIL HFA;VENTOLIN HFA) 108 (90 Base) MCG/ACT inhaler Inhale 2 puffs into the lungs every 6 (six) hours as needed for wheezing or shortness of breath (or coughing).      aluminum-magnesium hydroxide 200-200 MG/5ML suspension Take 15-30 mLs by mouth every 2 (two) hours as needed for indigestion (15 ml's if under 100 pounds and 30 ml's if over 100 pounds).      ARIPiprazole (ABILIFY) 15 MG tablet Take 15 mg by mouth in the morning.      bisacodyl (DULCOLAX) 10 MG suppository Place 10 mg rectally daily as needed for mild constipation (if no B/M in 72 hours).      budesonide-formoterol (SYMBICORT) 160-4.5 MCG/ACT inhaler Inhale 2 puffs into the  lungs 2 (two) times daily.      Cholecalciferol (VITAMIN D3) 50 MCG (2000 UT) capsule Take 4,000 Units by mouth daily.      diphenhydrAMINE (BENADRYL) 12.5 MG/5ML elixir Take 25 mg by mouth every 4 (four) hours as needed for itching or allergies (or nasal congestion).      guaifenesin (ROBITUSSIN) 100 MG/5ML syrup Take 300 mg by mouth every 4 (four) hours as needed for cough.      hydrocortisone cream 1 % Apply 1 Application topically as needed for itching (or bug bites, skin irritation, rashes, chapped skin- affected areas).      hydrOXYzine (  VISTARIL) 50 MG capsule Take 50 mg by mouth every 8 (eight) hours as needed ("for behaviors not controlled by BSP lasting longer than 5 minutes").      lamoTRIgine (LAMICTAL) 25 MG tablet Take 50 mg by mouth in the morning.      loperamide (IMODIUM A-D) 2 MG tablet Take 2-4 mg by mouth See admin instructions. Take 4 mg by mouth for initial dose of diarrhea and 2 mg after each subsequent loose stool- cannot exceed a total of 16 mg/24 hours      loratadine (CLARITIN) 10 MG tablet Take 10 mg by mouth daily.      magnesium hydroxide (MILK OF MAGNESIA) 400 MG/5ML suspension Take 15-30 mLs by mouth daily as needed for mild constipation (15 ml's if under 100 pounds and 30 ml's if over 100 pounds- if no relief from Dulcolax suppository).      mometasone-formoterol (DULERA) 100-5 MCG/ACT AERO Inhale 2 puffs into the lungs 2 (two) times daily. (Patient not taking: Reported on 08/14/2022) 1 Inhaler 1    neomycin-bacitracin-polymyxin [phone] OINT Apply 1 Application topically See admin instructions. Apply twice daily to superficial abrasions and lacerations after cleansing. Cover with a dressing.      promethazine (PHENERGAN) 25 MG tablet Take 25 mg by mouth See admin instructions. Take 25 mg by mouth every 4-6 hours as needed for vomiting 2 or more times in four hours and call MD if symptoms persist      QUEtiapine (SEROQUEL) 400 MG tablet Take 400 mg by mouth at  bedtime.      QUEtiapine (SEROQUEL) 50 MG tablet Take 50 mg by mouth See admin instructions. Take 50 mg by mouth in the morning and at noon      sodium phosphate (FLEET) 7-19 GM/118ML ENEM Place 1 enema rectally daily as needed (for constipation if no results from Dulcolax suppository and milk of magnesia).      verapamil (CALAN) 120 MG tablet Take 120 mg by mouth in the morning and at bedtime.      verapamil (CALAN) 40 MG tablet Take 40 mg by mouth daily at 12 noon.      Vitamins A & D (VITAMIN A & D) ointment Apply 1 Application topically 3 (three) times daily as needed for dry skin (or skin irritation, rashes, or chapped skin).      Musculoskeletal: Strength & Muscle Tone: within normal limits Gait & Station: normal Patient leans: N/A  Psychiatric Specialty Exam:  Presentation  General Appearance:  Casual; Fairly Groomed  Eye Contact: Fair  Speech: Clear and Coherent; Normal Rate (Presents with a deep voice (boy-like).)  Speech Volume: Normal  Handedness: Right   Mood and Affect  Mood: Depressed; Anxious (Rates depression #10, anxiety #5. (scale of 1-10).)  Affect: Restricted   Thought Process  Thought Processes: Coherent; Goal Directed  Duration of Psychotic Symptoms: Greater than 2 weeks.  Past Diagnosis of Schizophrenia or Psychoactive disorder: Yes  Descriptions of Associations:Intact  Orientation:Full (Time, Place and Person)  Thought Content:Logical; Rumination  Hallucinations:Hallucinations: Auditory Description of Auditory Hallucinations: Whispering voice at night.  Ideas of Reference:None  Suicidal Thoughts:Suicidal Thoughts: Yes, Passive (Currently denies any plans or intent to hurt herself.) SI Active Intent and/or Plan: Without Intent; Without Plan; Without Means to Carry Out; Without Access to Means SI Passive Intent and/or Plan: Without Intent; Without Means to Carry Out; Without Plan; Without Access to Means  Homicidal Thoughts:Homicidal  Thoughts: No   Sensorium  Memory: Immediate Good; Recent Fair; Remote Fair  Judgment: Impaired  Insight: Shallow   Executive Functions  Concentration: Fair  Attention Span: Fair  Recall: [place] of Knowledge: Other (comment) (Limited)  Language: Good   Psychomotor Activity  Psychomotor Activity:Psychomotor Activity: Normal  Assets  Assets: Communication Skills; Desire for Improvement; Financial Resources/Insurance; Physical Health; Housing; Resilience; Social Support   Sleep  Sleep:Sleep: Good Number of Hours of Sleep: 6   Physical Exam: Physical Exam Vitals and nursing note reviewed.  HENT:     Nose: Nose normal.     Mouth/Throat:     Pharynx: Oropharynx is clear.  Eyes:     Pupils: Pupils are equal, round, and reactive to light.  Cardiovascular:     Comments: Hx. HTN.  B/p: 118/90.  HR: 106. Pulmonary:     Effort: Pulmonary effort is normal.  Genitourinary:    Comments: Deferred Musculoskeletal:        General: Normal range of motion.     Cervical back: Normal range of motion.  Skin:    General: Skin is warm and dry.  Neurological:     General: No focal deficit present.     Mental Status: She is alert and oriented to person, place, and time.    Review of Systems  Constitutional:  Negative for chills and fever.  HENT:  Negative for congestion and sore throat.   Respiratory:  Negative for cough, shortness of breath and wheezing.   Cardiovascular:  Negative for chest pain and palpitations.  Gastrointestinal:  Negative for abdominal pain, diarrhea, heartburn, nausea and vomiting.  Genitourinary:  Negative for dysuria.  Musculoskeletal:  Negative for falls and myalgias.  Neurological:  Negative for dizziness, tingling, tremors, sensory change, speech change, focal weakness, seizures, loss of consciousness, weakness and headaches.  Endo/Heme/Allergies:        Allergies: Fluoxetine, Lisinopril, Risperdal.  Psychiatric/Behavioral:   Positive for depression and suicidal ideas. Negative for hallucinations (Hx. of.), memory loss and substance abuse. The patient is nervous/anxious. The patient does not have insomnia.    Blood pressure (!) 149/99, pulse (!) 129, temperature 97.7 F (36.5 C), temperature source Oral, resp. rate 16, height 5\' 5"  (1.651 m), weight 67.8 kg, SpO2 97 %. Body mass index is 24.88 kg/m.  Treatment Plan Summary: Daily contact with patient to assess and evaluate symptoms and progress in treatment and Medication management.   Diagnoses. Severe recurrent major depressive disorder with psychotic features ([place])  Plan: Continue Seroquel 400 mg po Q bedtime for psychosis. Continue Seroquel 50 mg po bid for agitation/psychosis. Continue Hydroxyzine 25 mg po tid prn for anxiety.  Initiated Effexor XR 75 mg po daily for depression.   Other medical issues.  Initiated Propranolol 10 mg po bid for elevated heart rate. Continue albuterol inhaler 1-2 puffs Q 4 hrs prn for sob. Continue Dulera 200--5 MCG 2 puffs bid for asthma. Continue Verapamil 40 mg po daily for HTN.  Continue Verapamil 120 mg po Q hs for HTN.  Continue Vitam D3 4.000 units po daily for bone health.  Continue loratadine 10 mg po daily for allergies.  Other PRNS -Continue Tylenol 650 mg every 6 hours PRN for mild pain -Continue Maalox 30 ml Q 4 hrs PRN for indigestion -Continue MOM 30 ml po Q 6 hrs for constipation.  -Continue Zofran-odt 4 mg po Q 8 hrs for N/V.  Safety and Monitoring: Voluntary admission to inpatient psychiatric unit for safety, stabilization and treatment Daily contact with patient to assess and evaluate symptoms and progress in treatment Patient's case  to be discussed in multi-disciplinary team meeting Observation Level : q15 minute checks Vital signs: q12 hours Precautions: Safety  Discharge Planning: Social work and case management to assist with discharge planning and identification of [facility] follow-up  needs prior to discharge Estimated LOS: 5-7 days Discharge Concerns: Need to establish a safety plan; Medication compliance and effectiveness Discharge Goals: Return home with outpatient referrals for mental health follow-up including medication management/psychotherapy  Observation Level/Precautions:  15 minute checks  Laboratory:   Per ED, current lab results reviewed. Will obtain HGBa1c.  Psychotherapy: Enrolled in the group sessions.   Medications: See MAR.    Consultations: As needed.   Discharge Concerns: Group sessions.  Estimated LOS: 3-5 days.  Other: NA    Physician Treatment Plan for Primary Diagnosis: Severe recurrent major depressive disorder with psychotic features ([place]) Long Term Goal(s): Improvement in symptoms so as ready for discharge  Short Term Goals: Ability to identify changes in lifestyle to reduce recurrence of condition will improve, Ability to verbalize feelings will improve, Ability to disclose and discuss suicidal ideas, and Ability to demonstrate self-control will improve  Physician Treatment Plan for Secondary Diagnosis: Principal Problem:   Severe recurrent major depressive disorder with psychotic features ([place]) Active Problems:   Bipolar affect, depressed ([place])  Long Term Goal(s): Improvement in symptoms so as ready for discharge  Short Term Goals: Ability to identify and develop effective coping behaviors will improve, Ability to maintain clinical measurements within normal limits will improve, Compliance with prescribed medications will improve, and Ability to identify triggers associated with substance abuse/mental health issues will improve  I certify that inpatient services furnished can reasonably be expected to improve the patient's condition.    Lindell Spar, NP, pmhnp, fnp-bc 12/16/20232:57 PM

## 2022-08-18 NOTE — [facility] Group Notes (Signed)
Adult Psychoeducational Group Note Date:  08/18/2022 Time:  0900-1000 Group Topic/Focus: PROGRESSIVE RELAXATION. A group where deep breathing is taught and tensing and relaxation muscle groups is used. Imagery is used as well.  Pts are asked to imagine 3 pillars that hold them up when they are not able to hold themselves up and to share that with the group.   Participation Level:  Active  Participation Quality:  Appropriate  Affect:  Appropriate  Cognitive:  Approprate  Insight: Improving  Engagement in Group:  Engaged  Modes of Intervention:  deep breathing, Imagery. Discussion  Additional Comments:  Rates energy at a 5/10. States her boyfriend, friends and music hold her up.   : Dione Housekeeper

## 2022-08-18 NOTE — Group Note (Deleted)
LCSW Group Therapy Note   Group Date: 08/18/2022 Start Time: 1000 End Time: 1100   Type of Therapy and Topic:  Group Therapy:   Participation Level:  {[facility] PARTICIPATION LEVEL:22264}  Description of Group:   Therapeutic Goals:  1.     Summary of Patient Progress:    ***  Therapeutic Modalities:   Letina Luckett J Grossman-Orr, LCSWA 08/18/2022  9:18 AM    

## 2022-08-18 NOTE — [facility] Group Notes (Signed)
Adult Psychoeducational Group Note  Date:  08/18/2022 Time:  8:56 PM  Group Topic/Focus:  Wrap-Up Group:   The focus of this group is to help patients review their daily goal of treatment and discuss progress on daily workbooks.  Participation Level:  Active  Participation Quality:  Appropriate and Attentive  Affect:  Appropriate  Cognitive:  Alert and Appropriate  Insight: Appropriate and Good  Engagement in Group:  Engaged  Modes of Intervention:  Discussion and Education  Additional Comments:  Pt attended and participated in wrap up group this evening and rated their day a 5/10. Pt stated that they learned to interact with their peers as a coping skill. Pt did not complete their goal, which was to receive insight on their diagnosis.   Chrisandra Netters 08/18/2022, 8:56 PM

## 2022-08-18 NOTE — Progress Notes (Signed)
    08/18/22 2116  Psych Admission Type (Psych Patients Only)  Admission Status Involuntary  Psychosocial Assessment  Patient Complaints Anxiety  Eye Contact Fair  Facial Expression Anxious  Affect Anxious  Speech Loud;Pressured  Interaction Assertive  Motor Activity Restless;Pacing  Appearance/Hygiene Unremarkable  Behavior Characteristics Cooperative;Anxious  Mood Pleasant;Anxious  Thought Process  Coherency Concrete thinking  Content WDL  Delusions None reported or observed  Perception Hallucinations  Hallucination Auditory;Visual (Pt reported, AH as voices; VH as seeing "crosses.")  Judgment Limited  Confusion None  Danger to Self  Current suicidal ideation? Denies  Agreement Not to Harm Self Yes  Description of Agreement verbal  Danger to Others  Danger to Others None reported or observed

## 2022-08-18 NOTE — Progress Notes (Signed)
D. Pt continues to endorse anxiety- has been visible in the milieu pacing, states that she has a hard time sitting- minimal interaction with peers, but attending all groups. Pt currently denies SI/HI and AVH  A. Labs and vitals monitored. Pt given and educated on medications. Pt supported emotionally and encouraged to express concerns and ask questions.   R. Pt remains safe with 15 minute checks. Will continue POC.

## 2022-08-18 NOTE — Progress Notes (Signed)
[facility] Progress Note  08/18/2022 8:48 AM Brittany JacobMacy P Auerbach  MRN:  [id number]  Reason for admission: 26 year old Caucasian female. [facility] previous admissions for mood stabilization treatments. She does have hx of autism spectrum & currently resides in a group home setting. Prior to this admission, she was receiving mental health care on outpatient basis at the [facility] [place], [place]. [facility] this [facility] around with complain of worsening symptoms of depression & self-mutilating behaviors. She was brought to the hospital for evaluation & mood stabilization treatments.   Daily notes: Brittany CooksMacy is seen this morning in her room. She presents alert, oriented & aware of situation. She is visible on the unit, attending group sessions. She reports, "I'm doing so-so because of my mental status. I'm still feeling depressed because I feel like my family were being judgmental towards me including the church members. We belong to the a [employer] with strong beliefs. It was my church members that shown a picture of me dressed in a Tree surgeongothic attire (all black with shinny/sparkling spikes, pins, crosses & black booths) to my parents. I dress gothic at times when I'm upset or depressed because it helps me cope. My family are now using it against me.  I'm still hearing the muffled whispering voices at night, still could not understand what the voices were saying. And this morning, I saw a cross at the corner of my eyes. But, I think I will be okay". Brittany CooksMacy currently denies any self mutilating behaviors or the urge to cut on herself since [facility]. She denies any SIHI, delusional thoughts or paranoia. She has been resumed on all her pertinent medications. She is tolerating them. Denies any side effects. Reviewed vital signs. Her pulse rate is slightly elevated  (HR 105). She was started on propranolol 10 mg bid yesterday. After her initial dose, patient developed some  hives. The propranolol has been discontinued. Obtained EKG, result within norm. She is currently in no apparent distress.  Attempted to obtain collateral information from patient's mother: Haroldine LawsDana Williamson #[phone], no answer. Her answering service was full. We will continue current plan of care as already in progress.  Principal Problem: Severe recurrent major depressive disorder with psychotic features (HCC)  Diagnosis: Principal Problem:   Severe recurrent major depressive disorder with psychotic features (HCC) Active Problems:   Bipolar affect, depressed (HCC)  Total Time spent with patient:  35 minutes  Past Psychiatric History: See H&P.  Past Medical History:  Past Medical History:  Diagnosis Date   Asthma    Autism    GERD (gastroesophageal reflux disease)    Hypertension     Past Surgical History:  Procedure Laterality Date   CYSTECTOMY     DENTAL SURGERY     TONSILLECTOMY     Family History: History reviewed. No pertinent family history.  Family Psychiatric  History: See H&P.  Social History:  Social History   Substance and Sexual Activity  Alcohol Use No     Social History   Substance and Sexual Activity  Drug Use No    Social History   Socioeconomic History   Marital status: Single    Spouse name: Not on file   Number of children: Not on file   Years of education: Not on file   Highest education level: Not on file  Occupational History   Not on file  Tobacco Use   Smoking  status: Never   Smokeless tobacco: Never  Vaping Use   Vaping Use: Never used  Substance and Sexual Activity   Alcohol use: No   Drug use: No   Sexual activity: Never  Other Topics Concern   Not on file  Social History Narrative   Not on file   Social Determinants of Health   Financial Resource Strain: Not on file  Food Insecurity: Not on file  Transportation Needs: Not on file  Physical Activity: Not on file  Stress: Not on file  Social Connections: Not on file    Additional Social History:   Sleep: Good  Appetite:  Good  Current Medications: Current Facility-Administered Medications  Medication Dose Route Frequency Provider Last Rate Last Admin   acetaminophen (TYLENOL) tablet 650 mg  650 mg Oral Q6H PRN Motley-Mangrum, Jadeka A, PMHNP       albuterol (VENTOLIN HFA) 108 (90 Base) MCG/ACT inhaler 1-2 puff  1-2 puff Inhalation Q4H PRN Starleen Blue, NP   2 puff at 08/17/22 0820   alum & mag hydroxide-simeth (MAALOX/MYLANTA) 200-200-20 MG/5ML suspension 30 mL  30 mL Oral Q4H PRN Motley-Mangrum, Jadeka A, PMHNP   30 mL at 08/18/22 0737   hydrOXYzine (ATARAX) tablet 25 mg  25 mg Oral TID PRN Motley-Mangrum, Jadeka A, PMHNP   25 mg at 08/17/22 1848   loratadine (CLARITIN) tablet 10 mg  10 mg Oral Daily Motley-Mangrum, Jadeka A, PMHNP   10 mg at 08/18/22 0740   magnesium hydroxide (MILK OF MAGNESIA) suspension 30 mL  30 mL Oral Daily PRN Motley-Mangrum, Jadeka A, PMHNP       mometasone-formoterol (DULERA) 200-5 MCG/ACT inhaler 2 puff  2 puff Inhalation BID Motley-Mangrum, Jadeka A, PMHNP   2 puff at 08/18/22 0739   ondansetron (ZOFRAN-ODT) disintegrating tablet 4 mg  4 mg Oral Q8H PRN Motley-Mangrum, Jadeka A, PMHNP       QUEtiapine (SEROQUEL) tablet 400 mg  400 mg Oral QHS Motley-Mangrum, Jadeka A, PMHNP   400 mg at 08/17/22 2113   QUEtiapine (SEROQUEL) tablet 50 mg  50 mg Oral BID Motley-Mangrum, Jadeka A, PMHNP   50 mg at 08/18/22 0741   venlafaxine XR (EFFEXOR-XR) 24 hr capsule 75 mg  75 mg Oral Q breakfast Armandina Stammer I, NP   75 mg at 08/18/22 0740   verapamil (CALAN) tablet 120 mg  120 mg Oral Q12H Motley-Mangrum, Jadeka A, PMHNP   120 mg at 08/18/22 0740   verapamil (CALAN) tablet 40 mg  40 mg Oral Q1200 Motley-Mangrum, Jadeka A, PMHNP   40 mg at 08/17/22 1258   vitamin D3 (CHOLECALCIFEROL) tablet 4,000 Units  4,000 Units Oral Daily Motley-Mangrum, Jadeka A, PMHNP   4,000 Units at 08/18/22 0740    Lab Results:  Results for orders placed or  performed during the hospital encounter of 08/16/22 (from the past 48 hour(s))  Lipid panel     Status: None   Collection Time: 08/17/22  6:49 AM  Result Value Ref Range   Cholesterol 128 0 - 200 mg/dL   Triglycerides 55 <696 mg/dL   HDL 45 >29 mg/dL   Total CHOL/HDL Ratio 2.8 RATIO   VLDL 11 0 - 40 mg/dL   LDL Cholesterol 72 0 - 99 mg/dL    Comment:        Total Cholesterol/HDL:CHD Risk Coronary Heart Disease Risk Table                     Men   Women  1/2 Average Risk   3.4   3.3  Average Risk       5.0   4.4  2 X Average Risk   9.6   7.1  3 X Average Risk  23.4   11.0        Use the calculated Patient Ratio above and the CHD Risk Table to determine the patient's CHD Risk.        ATP III CLASSIFICATION (LDL):  <100     mg/dL   Optimal  497-026  mg/dL   Near or Above                    Optimal  130-159  mg/dL   Borderline  378-588  mg/dL   High  >502     mg/dL   Very High Performed at [facility], [address] [place], [place] [address]   TSH     Status: None   Collection Time: 08/17/22  6:49 AM  Result Value Ref Range   TSH 2.324 0.350 - 4.500 uIU/mL    Comment: Performed by a 3rd Generation assay with a functional sensitivity of <=0.01 uIU/mL. Performed at [facility], [address] [place], [place] [address]    Blood Alcohol level:  Lab Results  Component Value Date   ETH <10 08/12/2022   ETH <10 12/27/2021   Metabolic Disorder Labs: Lab Results  Component Value Date   HGBA1C 5.2 05/19/2018   MPG 103 05/19/2018   MPG 96.8 09/11/2017   Lab Results  Component Value Date   PROLACTIN 30.2 04/12/2011   Lab Results  Component Value Date   CHOL 128 08/17/2022   TRIG 55 08/17/2022   HDL 45 08/17/2022   CHOLHDL 2.8 08/17/2022   VLDL 11 08/17/2022   LDLCALC 72 08/17/2022   LDLCALC 56 05/19/2018    Physical Findings: AIMS:  , ,  ,  ,    CIWA:    COWS:     Musculoskeletal: Strength & Muscle Tone: within normal  limits Gait & Station: normal Patient leans: N/A  Psychiatric Specialty Exam:  Presentation  General Appearance:  Casual; Fairly Groomed  Eye Contact: Fair  Speech: Clear and Coherent; Normal Rate (Presents with a deep voice (boy-like).)  Speech Volume: Normal  Handedness: Right  Mood and Affect  Mood: Depressed; Anxious (Rates depression #10, anxiety #5. (scale of 1-10).)  Affect: Restricted  Thought Process  Thought Processes: Coherent; Goal Directed  Descriptions of Associations:Intact  Orientation:Full (Time, Place and Person)  Thought Content:Logical; Rumination  History of Schizophrenia/Schizoaffective disorder:Yes  Duration of Psychotic Symptoms:Greater than six months  Hallucinations:Hallucinations: Auditory Description of Auditory Hallucinations: Whispering voice at night.  Ideas of Reference:None  Suicidal Thoughts:Suicidal Thoughts: Yes, Passive (Currently denies any plans or intent to hurt herself.) SI Active Intent and/or Plan: Without Intent; Without Plan; Without Means to Carry Out; Without Access to Means SI Passive Intent and/or Plan: Without Intent; Without Means to Carry Out; Without Plan; Without Access to Means  Homicidal Thoughts:Homicidal Thoughts: No   Sensorium  Memory: Immediate Good; Recent Fair; Remote Fair  Judgment: Impaired  Insight: Shallow   Executive Functions  Concentration: Fair  Attention Span: Fair  Recall: Fair  Fund of Knowledge: Other (comment) (Limited)  Language: Good   Psychomotor Activity  Psychomotor Activity: Psychomotor Activity: Normal  Assets  Assets: Communication Skills; Desire for Improvement; Financial Resources/Insurance; Physical Health; Housing; Resilience; Social Support  Sleep  Sleep: Sleep: Good Number of  Hours of Sleep: 6  Physical Exam: Physical Exam Vitals and nursing note reviewed.  HENT:     Nose: Nose normal.     Mouth/Throat:     Pharynx: Oropharynx  is clear.  Eyes:     Pupils: Pupils are equal, round, and reactive to light.  Cardiovascular:     Rate and Rhythm: Normal rate.     Comments: Hx. Cardiac issues.  Pulse rate: Slightly elevated. Pulmonary:     Effort: Pulmonary effort is normal.  Genitourinary:    Comments: Deferred Musculoskeletal:        General: Normal range of motion.     Cervical back: Normal range of motion.  Skin:    General: Skin is warm and dry.  Neurological:     General: No focal deficit present.     Mental Status: She is alert and oriented to person, place, and time.    Review of Systems  Constitutional:  Negative for chills, diaphoresis and fever.  HENT:  Negative for congestion and sore throat.   Respiratory:  Negative for cough, shortness of breath and wheezing.   Cardiovascular:  Negative for chest pain and palpitations.  Gastrointestinal:  Negative for abdominal pain, constipation, diarrhea, heartburn, nausea and vomiting.  Musculoskeletal:  Negative for joint pain and myalgias.  Neurological:  Negative for dizziness, tingling, tremors, sensory change, speech change, focal weakness, seizures, loss of consciousness, weakness and headaches.  Endo/Heme/Allergies:        See allergy lists.  Psychiatric/Behavioral:  Positive for depression and hallucinations. Negative for memory loss, substance abuse and suicidal ideas. The patient is nervous/anxious. The patient does not have insomnia.    Blood pressure 123/81, pulse (!) 105, temperature 97.9 F (36.6 C), temperature source Oral, resp. rate 18, height 5\' 5"  (1.651 m), weight 67.8 kg, SpO2 99 %. Body mass index is 24.88 kg/m.  Treatment Plan Summary: Daily contact with patient to assess and evaluate symptoms and progress in treatment and Medication management.   Continue inpatient hospitalization.  Will continue today 08/18/2022 plan as below except where it is noted.   Diagnoses. Severe recurrent major depressive disorder with psychotic  features (HCC)  Plan: Continue Seroquel 400 mg po Q bedtime for psychosis. Continue Seroquel 50 mg po bid for agitation/psychosis. Continue Hydroxyzine 25 mg po tid prn for anxiety.  Continue Effexor XR 75 mg po daily for depression.   Other medical issues.  Discontinued Propranolol 10 mg po bid for elevated heart rate. Continue albuterol inhaler 1-2 puffs Q 4 hrs prn for sob. Continue Dulera 200--5 MCG 2 puffs bid for asthma. Continue Verapamil 40 mg po daily for HTN.  Continue Verapamil 120 mg po Q hs for HTN.  Continue Vitam D3 4.000 units po daily for bone health.  Continue loratadine 10 mg po daily for allergies.   Other PRNS -Continue Tylenol 650 mg every 6 hours PRN for mild pain -Continue Maalox 30 ml Q 4 hrs PRN for indigestion -Continue MOM 30 ml po Q 6 hrs for constipation.  -Continue Zofran-odt 4 mg po Q 8 hrs for N/V.   Safety and Monitoring: Voluntary admission to inpatient psychiatric unit for safety, stabilization and treatment Daily contact with patient to assess and evaluate symptoms and progress in treatment Patient's case to be discussed in multi-disciplinary team meeting Observation Level : q15 minute checks Vital signs: q12 hours Precautions: Safety   Discharge Planning: Social work and case management to assist with discharge planning and identification of hospital follow-up needs prior to  discharge Estimated LOS: 5-7 days Discharge Concerns: Need to establish a safety plan; Medication compliance and effectiveness Discharge Goals: Return home with outpatient referrals for mental health follow-up including medication management/psychotherapy    Armandina Stammer, NP, pmhnp, 08/18/2022, 8:48 AM

## 2022-08-18 NOTE — B[facility] Group Notes (Signed)
Adult Psychoeducational Group  Date:  08/18/2022 Time:  1100-1200  Group Topic/Focus: Continuation of the group from Saturday. Looking at the lists that were created and talking about what needs to be done with the homework of 30 positives about themselves.                                     Talking about taking their power back and helping themselves to develop a positive self esteem.      Participation Quality:  Appropriate  Affect:  Appropriate  Cognitive:  Oriented  Insight: Improving  Engagement in Group:  Engaged  Modes of Intervention:  Activity, Discussion, Education, and Support  Additional Comments:  Rates energy at a a 5/10. Participated in the group.  Dione Housekeeper

## 2022-08-18 NOTE — Progress Notes (Signed)
   08/17/22 2000  Psych Admission Type (Psych Patients Only)  Admission Status Involuntary  Psychosocial Assessment  Patient Complaints Anxiety;Helplessness  Eye Contact Brief  Facial Expression Flat  Affect Flat  Speech Loud  Interaction Minimal  Motor Activity Pacing  Appearance/Hygiene Unremarkable  Behavior Characteristics Cooperative  Mood Preoccupied  Thought Process  Coherency WDL  Content WDL  Delusions None reported or observed  Perception Hallucinations  Hallucination None reported or observed  Judgment Poor  Confusion None  Danger to Self  Current suicidal ideation? Passive (Denies)  Description of Suicide Plan no plan  Agreement Not to Harm Self Yes  Description of Agreement verbally contracts

## 2022-08-18 NOTE — Progress Notes (Signed)
EKG results placed on the outside of patient's shadow chart  Normal sinus rhythm Normal ECG  QT/Qtc-Baz   378/435 ms

## 2022-08-18 NOTE — Group Note (Signed)
B[facility] LCSW Group Therapy Note  08/18/2022  10:00-11:00AM  Type of Therapy and Topic:  Group Therapy:  Adding Supports Including Myself  Participation Level:  Minimal   Description of Group:  Patients in this group were introduced to the differences between healthy supports and unheathy supports.  This led to a lengthy and emotional discussion about how to set boundaries, particularly with family members.  Many in group expressed that they put other people before themselves.  The group discussed that this not only hurts them, but ultimately ends up hurting the people they want to help.  Examples were given about how to set boundaries one at a time rather than merely cutting people off.  A song entitled "My Own Hero" was played.  A group discussion ensued in which patients stated they could relate to the song and it inspired them to realize they have be willing to help themselves in order to succeed, because other people cannot achieve sobriety or stability for them.  We discussed adding a variety of healthy supports to address the various needs in our lives.    Therapeutic Goals: 1)  demonstrate the importance of being a part of one's own support system by asking for and accepting help 2)  discuss reasons people in one's life may eventually be unable to be continually supportive  3)  identify the patient's current support system and   4)  elicit commitments to add healthy supports and to become more conscious of being self-supportive   Summary of Patient Progress:  The patient expressed her healthy support(s) right now include friends while unhealthy supports include her parents right now.  The patient's overall reaction to this topic was limited interest.  Therapeutic Modalities:   Motivational Interviewing Activity  Lynnell Chad

## 2022-08-18 NOTE — [facility] Suicide Risk Assessment (Signed)
[facility] INPATIENT:  Family/Significant Other Suicide Prevention Education  Suicide Prevention Education:  Education Completed; Mother/Legal Guardian Aiyah Scarpelli [phone],  (name of family member/significant other) has been identified by the patient as the family member/significant other with whom the patient will be residing, and identified as the person(s) who will aid the patient in the event of a mental health crisis (suicidal ideations/suicide attempt).    Mother/Legal Guardian states that the group home has yet to even call her to inform her that the patient has been hospitalized.  The patient has lived at [facility] for 4 years and mother believes they intend to take her back.  Mother stated that this is "typical" of the patient when she does not get her way.  She states that 6-8 weeks ago the patient posted "gothic stuff" on Facebook and they called her on it.  Mother stated, "If you act the fool, posting stuff on social media act like you are worse than Satan, than you are acting the fool."  The patient then said they were being judgmental and to leave her alone, she did not want to have anything to do with them.  They then left her alone through Thanksgiving.  Mother stated that her current self-harm that led to this hospitalization was probably to see if she could get her parents to come running to her for Christmas, without having to ask them to do it.  Mother was reluctant to receive the SPE, stating that this is the reason the patient lives in a group home, because they cannot deal with this stuff.  However, she did listen.   With written consent from the patient, the family member/significant other has been provided the following suicide prevention education, prior to the and/or following the discharge of the patient.  The suicide prevention education provided includes the following: Suicide risk factors Suicide prevention and interventions National Suicide Hotline telephone  number [facility] assessment telephone number [facility] Emergency Assistance 911 [facility] and/or Residential Mobile Crisis Unit telephone number  Request made of family/significant other to: Remove weapons (e.g., guns, rifles, knives), all items previously/currently identified as safety concern.   Remove drugs/medications (over-the-counter, prescriptions, illicit drugs), all items previously/currently identified as a safety concern.  The family member/significant other verbalizes understanding of the suicide prevention education information provided.  The family member/significant other agrees to remove the items of safety concern listed above.  Carloyn Jaeger Grossman-Orr 08/18/2022, 2:21 PM

## 2022-08-19 ENCOUNTER — Encounter (HOSPITAL_COMMUNITY): Payer: Self-pay

## 2022-08-19 LAB — HEMOGLOBIN A1C
Hgb A1c MFr Bld: 5.1 % (ref 4.8–5.6)
Mean Plasma Glucose: 100 mg/dL

## 2022-08-19 NOTE — [facility] Group Notes (Signed)
Spiritual care group on grief and loss facilitated by Chaplain Dyanne Carrel, [facility], and Chaplain Genesis Adams   Group Goal:  Support / Education around grief and loss  Members engage in facilitated group support and psycho-social education.  Group Description:  Following introductions and group rules, group members engaged in facilitated group dialog and support around topic of loss, with particular support around experiences of loss in their lives. Group Identified types of loss (relationships / self / things) and identified patterns, circumstances, and changes that precipitate losses. Reflected on thoughts / feelings around loss, normalized grief responses, and recognized variety in grief experience.  Group drew on Adlerian / Rogerian, narrative, MI,  Patient Progress: Brittany Palmer attended group and actively engaged in the group conversation and activities.  Brittany Palmer helped the group name some grief responses and shared about some of her coping skills.  [address] [facility] Pager, [phone]

## 2022-08-19 NOTE — [facility] Counselor (Signed)
CSW spoke with Annabelle Harman Latka(Mom/Guardian) to obtain consent for (ROI) Request and Authorization of Health Information. CSW was informed that the patient resides in a group home and states that she would not be providing transportation for the patient at discharge. Ms. Cleon Dew asserts that a staff member from the group home would be responsible. CSW scanned ROI documents via email to Ms. Schindler and is awaiting signature. Will continue to monitor and provide support as needed.

## 2022-08-19 NOTE — Plan of Care (Signed)
  Problem: Activity: Goal: Interest or engagement in activities will improve Outcome: Progressing Goal: Sleeping patterns will improve Outcome: Progressing   Problem: Coping: Goal: Ability to demonstrate self-control will improve Outcome: Progressing   Problem: Education: Goal: Emotional status will improve Outcome: Not Progressing Goal: Mental status will improve Outcome: Not Progressing

## 2022-08-19 NOTE — Progress Notes (Signed)
    08/19/22 2200  Psych Admission Type (Psych Patients Only)  Admission Status Involuntary  Psychosocial Assessment  Patient Complaints Anxiety;Self-harm thoughts;Shakiness  Eye Contact Fair  Facial Expression Anxious  Affect Anxious  Speech Elective mutism  Interaction Assertive  Motor Activity Slow  Appearance/Hygiene Unremarkable  Behavior Characteristics Cooperative;Anxious  Mood Pleasant;Anxious  Thought Process  Coherency Concrete thinking  Content WDL  Delusions None reported or observed  Perception Hallucinations  Hallucination Auditory;Visual  Judgment Limited  Confusion None  Danger to Self  Current suicidal ideation? Passive  Agreement Not to Harm Self Yes  Danger to Others  Danger to Others None reported or observed

## 2022-08-19 NOTE — BH IP Treatment Plan (Signed)
Interdisciplinary Treatment and Diagnostic Plan Update  08/19/2022 Time of Session: 9:30am Brittany Palmer MRN: [id number]  Principal Diagnosis: Severe recurrent major depressive disorder with psychotic features [facility])  Secondary Diagnoses: Principal Problem:   Severe recurrent major depressive disorder with psychotic features (HCC) Active Problems:   Autism spectrum disorder   HTN (hypertension)   Current Medications:  Current Facility-Administered Medications  Medication Dose Route Frequency Provider Last Rate Last Admin   acetaminophen (TYLENOL) tablet 650 mg  650 mg Oral Q6H PRN Motley-Mangrum, Jadeka A, PMHNP       albuterol (VENTOLIN HFA) 108 (90 Base) MCG/ACT inhaler 1-2 puff  1-2 puff Inhalation Q4H PRN Starleen Blue, NP   2 puff at 08/17/22 0820   alum & mag hydroxide-simeth (MAALOX/MYLANTA) 200-200-20 MG/5ML suspension 30 mL  30 mL Oral Q4H PRN Motley-Mangrum, Jadeka A, PMHNP   30 mL at 08/18/22 0737   hydrOXYzine (ATARAX) tablet 25 mg  25 mg Oral TID PRN Motley-Mangrum, Jadeka A, PMHNP   25 mg at 08/18/22 1429   loratadine (CLARITIN) tablet 10 mg  10 mg Oral Daily Motley-Mangrum, Jadeka A, PMHNP   10 mg at 08/19/22 0824   magnesium hydroxide (MILK OF MAGNESIA) suspension 30 mL  30 mL Oral Daily PRN Motley-Mangrum, Jadeka A, PMHNP       mometasone-formoterol (DULERA) 200-5 MCG/ACT inhaler 2 puff  2 puff Inhalation BID Motley-Mangrum, Jadeka A, PMHNP   2 puff at 08/19/22 0822   ondansetron (ZOFRAN-ODT) disintegrating tablet 4 mg  4 mg Oral Q8H PRN Motley-Mangrum, Jadeka A, PMHNP       QUEtiapine (SEROQUEL) tablet 400 mg  400 mg Oral QHS Motley-Mangrum, Jadeka A, PMHNP   400 mg at 08/18/22 2108   QUEtiapine (SEROQUEL) tablet 50 mg  50 mg Oral BID Motley-Mangrum, Jadeka A, PMHNP   50 mg at 08/19/22 0823   venlafaxine XR (EFFEXOR-XR) 24 hr capsule 75 mg  75 mg Oral Q breakfast Nwoko, Nicole Kindred I, NP   75 mg at 08/19/22 0824   verapamil (CALAN) tablet 120 mg  120 mg Oral Q12H  Motley-Mangrum, Jadeka A, PMHNP   120 mg at 08/19/22 0823   verapamil (CALAN) tablet 40 mg  40 mg Oral Q1200 Motley-Mangrum, Jadeka A, PMHNP   40 mg at 08/18/22 1251   vitamin D3 (CHOLECALCIFEROL) tablet 4,000 Units  4,000 Units Oral Daily Motley-Mangrum, Jadeka A, PMHNP   4,000 Units at 08/19/22 0938   PTA Medications: Medications Prior to Admission  Medication Sig Dispense Refill Last Dose   acetaminophen (TYLENOL) 160 MG/5ML suspension Take 640 mg by mouth every 4 (four) hours as needed for mild pain or fever.      albuterol (PROVENTIL HFA;VENTOLIN HFA) 108 (90 Base) MCG/ACT inhaler Inhale 2 puffs into the lungs every 6 (six) hours as needed for wheezing or shortness of breath (or coughing).      aluminum-magnesium hydroxide 200-200 MG/5ML suspension Take 15-30 mLs by mouth every 2 (two) hours as needed for indigestion (15 ml's if under 100 pounds and 30 ml's if over 100 pounds).      ARIPiprazole (ABILIFY) 15 MG tablet Take 15 mg by mouth in the morning.      bisacodyl (DULCOLAX) 10 MG suppository Place 10 mg rectally daily as needed for mild constipation (if no B/M in 72 hours).      budesonide-formoterol (SYMBICORT) 160-4.5 MCG/ACT inhaler Inhale 2 puffs into the lungs 2 (two) times daily.      Cholecalciferol (VITAMIN D3) 50 MCG (2000 UT) capsule Take  4,000 Units by mouth daily.      diphenhydrAMINE (BENADRYL) 12.5 MG/5ML elixir Take 25 mg by mouth every 4 (four) hours as needed for itching or allergies (or nasal congestion).      guaifenesin (ROBITUSSIN) 100 MG/5ML syrup Take 300 mg by mouth every 4 (four) hours as needed for cough.      hydrocortisone cream 1 % Apply 1 Application topically as needed for itching (or bug bites, skin irritation, rashes, chapped skin- affected areas).      hydrOXYzine (VISTARIL) 50 MG capsule Take 50 mg by mouth every 8 (eight) hours as needed ("for behaviors not controlled by BSP lasting longer than 5 minutes").      lamoTRIgine (LAMICTAL) 25 MG tablet Take  50 mg by mouth in the morning.      loperamide (IMODIUM A-D) 2 MG tablet Take 2-4 mg by mouth See admin instructions. Take 4 mg by mouth for initial dose of diarrhea and 2 mg after each subsequent loose stool- cannot exceed a total of 16 mg/24 hours      loratadine (CLARITIN) 10 MG tablet Take 10 mg by mouth daily.      magnesium hydroxide (MILK OF MAGNESIA) 400 MG/5ML suspension Take 15-30 mLs by mouth daily as needed for mild constipation (15 ml's if under 100 pounds and 30 ml's if over 100 pounds- if no relief from Dulcolax suppository).      mometasone-formoterol (DULERA) 100-5 MCG/ACT AERO Inhale 2 puffs into the lungs 2 (two) times daily. (Patient not taking: Reported on 08/14/2022) 1 Inhaler 1    neomycin-bacitracin-polymyxin [phone] OINT Apply 1 Application topically See admin instructions. Apply twice daily to superficial abrasions and lacerations after cleansing. Cover with a dressing.      promethazine (PHENERGAN) 25 MG tablet Take 25 mg by mouth See admin instructions. Take 25 mg by mouth every 4-6 hours as needed for vomiting 2 or more times in four hours and call MD if symptoms persist      QUEtiapine (SEROQUEL) 400 MG tablet Take 400 mg by mouth at bedtime.      QUEtiapine (SEROQUEL) 50 MG tablet Take 50 mg by mouth See admin instructions. Take 50 mg by mouth in the morning and at noon      sodium phosphate (FLEET) 7-19 GM/118ML ENEM Place 1 enema rectally daily as needed (for constipation if no results from Dulcolax suppository and milk of magnesia).      verapamil (CALAN) 120 MG tablet Take 120 mg by mouth in the morning and at bedtime.      verapamil (CALAN) 40 MG tablet Take 40 mg by mouth daily at 12 noon.      Vitamins A & D (VITAMIN A & D) ointment Apply 1 Application topically 3 (three) times daily as needed for dry skin (or skin irritation, rashes, or chapped skin).       Patient Stressors: Other: parents    Patient Strengths: Printmaker for  treatment/growth  Physical Health   Treatment Modalities: Medication Management, Group therapy, Case management,  1 to 1 session with clinician, Psychoeducation, Recreational therapy.   Physician Treatment Plan for Primary Diagnosis: Severe recurrent major depressive disorder with psychotic features (HCC) Long Term Goal(s): Improvement in symptoms so as ready for discharge   Short Term Goals: Ability to identify and develop effective coping behaviors will improve Ability to maintain clinical measurements within normal limits will improve Compliance with prescribed medications will improve Ability to identify triggers associated with substance abuse/mental health  issues will improve Ability to identify changes in lifestyle to reduce recurrence of condition will improve Ability to verbalize feelings will improve Ability to disclose and discuss suicidal ideas Ability to demonstrate self-control will improve  Medication Management: Evaluate patient's response, side effects, and tolerance of medication regimen.  Therapeutic Interventions: 1 to 1 sessions, Unit Group sessions and Medication administration.  Evaluation of Outcomes: Progressing  Physician Treatment Plan for Secondary Diagnosis: Principal Problem:   Severe recurrent major depressive disorder with psychotic features (HCC) Active Problems:   Autism spectrum disorder   HTN (hypertension)  Long Term Goal(s): Improvement in symptoms so as ready for discharge   Short Term Goals: Ability to identify and develop effective coping behaviors will improve Ability to maintain clinical measurements within normal limits will improve Compliance with prescribed medications will improve Ability to identify triggers associated with substance abuse/mental health issues will improve Ability to identify changes in lifestyle to reduce recurrence of condition will improve Ability to verbalize feelings will improve Ability to disclose and  discuss suicidal ideas Ability to demonstrate self-control will improve     Medication Management: Evaluate patient's response, side effects, and tolerance of medication regimen.  Therapeutic Interventions: 1 to 1 sessions, Unit Group sessions and Medication administration.  Evaluation of Outcomes: Progressing   RN Treatment Plan for Primary Diagnosis: Severe recurrent major depressive disorder with psychotic features (HCC) Long Term Goal(s): Knowledge of disease and therapeutic regimen to maintain health will improve  Short Term Goals: Ability to remain free from injury will improve, Ability to verbalize frustration and anger appropriately will improve, Ability to demonstrate self-control, Ability to participate in decision making will improve, Ability to verbalize feelings will improve, Ability to disclose and discuss suicidal ideas, Ability to identify and develop effective coping behaviors will improve, and Compliance with prescribed medications will improve  Medication Management: RN will administer medications as ordered by provider, will assess and evaluate patient's response and provide education to patient for prescribed medication. RN will report any adverse and/or side effects to prescribing provider.  Therapeutic Interventions: 1 on 1 counseling sessions, Psychoeducation, Medication administration, Evaluate responses to treatment, Monitor vital signs and CBGs as ordered, Perform/monitor CIWA, COWS, AIMS and Fall Risk screenings as ordered, Perform wound care treatments as ordered.  Evaluation of Outcomes: Progressing   LCSW Treatment Plan for Primary Diagnosis: Severe recurrent major depressive disorder with psychotic features (HCC) Long Term Goal(s): Safe transition to appropriate next level of care at discharge, Engage patient in therapeutic group addressing interpersonal concerns.  Short Term Goals: Engage patient in aftercare planning with referrals and resources, Increase  social support, Increase ability to appropriately verbalize feelings, Increase emotional regulation, Facilitate acceptance of mental health diagnosis and concerns, Facilitate patient progression through stages of change regarding substance use diagnoses and concerns, Identify triggers associated with mental health/substance abuse issues, and Increase skills for wellness and recovery  Therapeutic Interventions: Assess for all discharge needs, 1 to 1 time with Social worker, Explore available resources and support systems, Assess for adequacy in community support network, Educate family and significant other(s) on suicide prevention, Complete Psychosocial Assessment, Interpersonal group therapy.  Evaluation of Outcomes: Progressing   Progress in Treatment: Attending groups: Yes. Participating in groups: Yes. Taking medication as prescribed: Yes. Toleration medication: Yes. Family/Significant other contact made: No, will contact:  Legal Guardian has not answered the phone: Althea CharonDana Montz [phone]. Patient understands diagnosis: Yes. Discussing patient identified problems/goals with staff: Yes. Medical problems stabilized or resolved: Yes. Denies suicidal/homicidal ideation: Yes. Issues/concerns per  patient self-inventory: No.  New problem(s) identified: No, Describe:  none reported   New Short Term/Long Term Goal(s):  medication stabilization, elimination of SI thoughts, development of comprehensive mental wellness plan.    Patient Goals:  Pt states that she would like to gain additional insight into mental health, get a diagnosis and start treatment.   Discharge Plan or Barriers: Patient recently admitted. CSW will continue to follow and assess for appropriate referrals and possible discharge planning.    Reason for Continuation of Hospitalization: Anxiety Depression Medication stabilization Suicidal ideation  Estimated Length of Stay: 1-3 days  Last 3 [place] Suicide Severity  Risk Score: Flowsheet Row Ad[facility] (Current) [facility]/12/2022 in [facility] [place] [facility] from 05/03/2022 in [facility] [place] [facility]  C-SSRS RISK CATEGORY High Risk High Risk High Risk       Last PHQ 2/9 Scores:     No data to display          Scribe for Treatment Team: Beatris Si, LCSW 08/19/2022 10:55 AM

## 2022-08-19 NOTE — Progress Notes (Signed)
[facility] MD Progress Note  08/19/2022 1:34 PM Brittany Palmer  MRN:  [id number]  Reason for admission: 26 year old Caucasian female. Patient is known in this [facility] from her previous admissions for mood stabilization treatments. She does have hx of autism spectrum & currently resides in a group home setting. Prior to this admission, she was receiving mental health care on outpatient basis at the [facility] in [place], [place]. She is admitted to the [facility] this time around with complain of worsening symptoms of depression & self-mutilating behaviors. She was brought to the hospital for evaluation & mood stabilization treatments.   Daily notes: [name] is seen in her room. She is lying down in bed. She is arousable. She is verbally responsive. She is making a good eye contact & her affect somewhat is odd, but reactive. She is verbally responsive. She reports, "I feel tired today. I feel emotionless today, really. I feel like nothing is fazing me today. This feeling started yesterday evening. I have not heard any voices today, but earlier in the morning, it felt like something was touching me on my legs & I have been seeing some crosses today. I also felt dizzy & light headed today. This could be because I stopped taken the Lamictal, Abilify & propranolol. I have been on these medicines for a long time. Patient was explained that the Propranolol was the newest of all her medications & she was on it for less than a day as she only received one dose of it. As for the Lamictal & Abilify, she is explained that she is receiving a high dose of Seroquel 400 mg at bedtime & Seroquel 50 mg bid to make up for the Abilify & Lamictal there were discontinued. Although presenting or rather reporting these symptoms, [name] does not present distressed, psychotic or manic. She may be approaching her baseline. This provider tried to obtain collateral information from patient's mother yesterday, her mother was unable to answer her phone & her answering  service was full. However, was able to reach patient's mother Brittany Palmer on the phone today. See her reports below:   Collateral information from patient's mother: Haroldine Laws [phone]: "Brittany Palmer is my daughter, but has not lived with me in 10 years. She lives in a group home. I do not know the group home's phone #. All I can tell you is Brittany Palmer complains of suicidal ideations a lot & ha been in & out of mental health institutions a lot. As for her day to day behaviors & other stuff, I can't help you on those".  We will continue current plan of care as already in progress. No changes made.  Principal Problem: Severe recurrent major depressive disorder with psychotic features (HCC)  Diagnosis: Principal Problem:   Severe recurrent major depressive disorder with psychotic features (HCC) Active Problems:   Autism spectrum disorder   HTN (hypertension)  Total Time spent with patient:  35 minutes  Past Psychiatric History: See H&P.  Past Medical History:  Past Medical History:  Diagnosis Date   Asthma    Autism    GERD (gastroesophageal reflux disease)    Hypertension     Past Surgical History:  Procedure Laterality Date   CYSTECTOMY     DENTAL SURGERY     TONSILLECTOMY     Family History: History reviewed. No pertinent family history.  Family Psychiatric  History: See H&P.  Social History:  Social History   Substance and Sexual Activity  Alcohol Use No  Social History   Substance and Sexual Activity  Drug Use No    Social History   Socioeconomic History   Marital status: Single    Spouse name: Not on file   Number of children: Not on file   Years of education: Not on file   Highest education level: Not on file  Occupational History   Not on file  Tobacco Use   Smoking status: Never   Smokeless tobacco: Never  Vaping Use   Vaping Use: Never used  Substance and Sexual Activity   Alcohol use: No   Drug use: No   Sexual activity: Never  Other Topics Concern    Not on file  Social History Narrative   Not on file   Social Determinants of Health   Financial Resource Strain: Not on file  Food Insecurity: Not on file  Transportation Needs: Not on file  Physical Activity: Not on file  Stress: Not on file  Social Connections: Not on file   Additional Social History:   Sleep: Good  Appetite:  Good  Current Medications: Current Facility-Administered Medications  Medication Dose Route Frequency Provider Last Rate Last Admin   acetaminophen (TYLENOL) tablet 650 mg  650 mg Oral Q6H PRN Motley-Mangrum, Jadeka A, PMHNP       albuterol (VENTOLIN HFA) 108 (90 Base) MCG/ACT inhaler 1-2 puff  1-2 puff Inhalation Q4H PRN Starleen Blue, NP   2 puff at 08/17/22 0820   alum & mag hydroxide-simeth (MAALOX/MYLANTA) 200-200-20 MG/5ML suspension 30 mL  30 mL Oral Q4H PRN Motley-Mangrum, Jadeka A, PMHNP   30 mL at 08/18/22 0737   hydrOXYzine (ATARAX) tablet 25 mg  25 mg Oral TID PRN Motley-Mangrum, Jadeka A, PMHNP   25 mg at 08/18/22 1429   loratadine (CLARITIN) tablet 10 mg  10 mg Oral Daily Motley-Mangrum, Jadeka A, PMHNP   10 mg at 08/19/22 0824   magnesium hydroxide (MILK OF MAGNESIA) suspension 30 mL  30 mL Oral Daily PRN Motley-Mangrum, Jadeka A, PMHNP       mometasone-formoterol (DULERA) 200-5 MCG/ACT inhaler 2 puff  2 puff Inhalation BID Motley-Mangrum, Jadeka A, PMHNP   2 puff at 08/19/22 0822   ondansetron (ZOFRAN-ODT) disintegrating tablet 4 mg  4 mg Oral Q8H PRN Motley-Mangrum, Jadeka A, PMHNP       QUEtiapine (SEROQUEL) tablet 400 mg  400 mg Oral QHS Motley-Mangrum, Jadeka A, PMHNP   400 mg at 08/18/22 2108   QUEtiapine (SEROQUEL) tablet 50 mg  50 mg Oral BID Motley-Mangrum, Jadeka A, PMHNP   50 mg at 08/19/22 1258   venlafaxine XR (EFFEXOR-XR) 24 hr capsule 75 mg  75 mg Oral Q breakfast Torey Reinard, Nicole Kindred I, NP   75 mg at 08/19/22 0824   verapamil (CALAN) tablet 120 mg  120 mg Oral Q12H Motley-Mangrum, Jadeka A, PMHNP   120 mg at 08/19/22 0823   verapamil  (CALAN) tablet 40 mg  40 mg Oral Q1200 Motley-Mangrum, Jadeka A, PMHNP   40 mg at 08/19/22 1258   vitamin D3 (CHOLECALCIFEROL) tablet 4,000 Units  4,000 Units Oral Daily Motley-Mangrum, Jadeka A, PMHNP   4,000 Units at 08/19/22 2993    Lab Results:  No results found for this or any previous visit (from the past 48 hour(s)).  Blood Alcohol level:  Lab Results  Component Value Date   [facility] <10 08/12/2022   ETH <10 12/27/2021   Metabolic Disorder Labs: Lab Results  Component Value Date   HGBA1C 5.1 08/17/2022   MPG 100  08/17/2022   MPG 103 05/19/2018   Lab Results  Component Value Date   PROLACTIN 30.2 04/12/2011   Lab Results  Component Value Date   CHOL 128 08/17/2022   TRIG 55 08/17/2022   HDL 45 08/17/2022   CHOLHDL 2.8 08/17/2022   VLDL 11 08/17/2022   LDLCALC 72 08/17/2022   LDLCALC 56 05/19/2018    Physical Findings: AIMS:  , ,  ,  ,    CIWA:    COWS:     Musculoskeletal: Strength & Muscle Tone: within normal limits Gait & Station: normal Patient leans: N/A  Psychiatric Specialty Exam:  Presentation  General Appearance:  Appropriate for Environment; Casual; Fairly Groomed  Eye Contact: Good  Speech: Normal Rate; Clear and Coherent ((Presents with a deep voice (boy-like).))  Speech Volume: Normal  Handedness: Right  Mood and Affect  Mood: -- ("Improving")  Affect: Congruent  Thought Process  Thought Processes: Coherent  Descriptions of Associations:Intact  Orientation:Full (Time, Place and Person)  Thought Content:Perseveration; Obsessions  History of Schizophrenia/Schizoaffective disorder:Yes  Duration of Psychotic Symptoms:Greater than six months  Hallucinations:Hallucinations: None Description of Auditory Hallucinations: NA  Ideas of Reference:None  Suicidal Thoughts:Suicidal Thoughts: No SI Active Intent and/or Plan: Without Intent; Without Plan; Without Means to Carry Out; Without Access to Means SI Passive Intent and/or  Plan: Without Intent; Without Plan; Without Means to Carry Out; Without Access to Means  Homicidal Thoughts:Homicidal Thoughts: No  Sensorium  Memory: Immediate Good; Recent Good; Remote Good  Judgment: Fair  Insight: Fair   Chartered certified accountant: Fair  Attention Span: Fair  Recall: Fair  Fund of Knowledge: Fair; Good  Language: Good  Psychomotor Activity  Psychomotor Activity: Psychomotor Activity: Normal  Assets  Assets: Communication Skills; Desire for Improvement; Financial Resources/Insurance; Housing; Physical Health; Resilience; Social Support  Sleep  Sleep: Sleep: Good Number of Hours of Sleep: 8.5  Physical Exam: Physical Exam Vitals and nursing note reviewed.  HENT:     Nose: Nose normal.     Mouth/Throat:     Pharynx: Oropharynx is clear.  Eyes:     Pupils: Pupils are equal, round, and reactive to light.  Cardiovascular:     Rate and Rhythm: Normal rate.     Comments: Hx. Cardiac issues.  Pulse rate: Slightly elevated. Pulmonary:     Effort: Pulmonary effort is normal.  Genitourinary:    Comments: Deferred Musculoskeletal:        General: Normal range of motion.     Cervical back: Normal range of motion.  Skin:    General: Skin is warm and dry.  Neurological:     General: No focal deficit present.     Mental Status: She is alert and oriented to person, place, and time.    Review of Systems  Constitutional:  Negative for chills, diaphoresis and fever.  HENT:  Negative for congestion and sore throat.   Respiratory:  Negative for cough, shortness of breath and wheezing.   Cardiovascular:  Negative for chest pain and palpitations.  Gastrointestinal:  Negative for abdominal pain, constipation, diarrhea, heartburn, nausea and vomiting.  Musculoskeletal:  Negative for joint pain and myalgias.  Neurological:  Negative for dizziness, tingling, tremors, sensory change, speech change, focal weakness, seizures, loss of  consciousness, weakness and headaches.  Endo/Heme/Allergies:        See allergy lists.  Psychiatric/Behavioral:  Positive for depression and hallucinations. Negative for memory loss, substance abuse and suicidal ideas. The patient is nervous/anxious. The patient does not have  insomnia.    Blood pressure 120/82, pulse (!) 101, temperature 97.8 F (36.6 C), temperature source Oral, resp. rate 20, height 5\' 5"  (1.651 m), weight 67.8 kg, SpO2 99 %. Body mass index is 24.88 kg/m.  Treatment Plan Summary: Daily contact with patient to assess and evaluate symptoms and progress in treatment and Medication management.   Continue inpatient hospitalization.  Will continue today 08/19/2022 plan as below except where it is noted.   Diagnoses. Severe recurrent major depressive disorder with psychotic features (HCC)  Plan: Continue Seroquel 400 mg po Q bedtime for psychosis. Continue Seroquel 50 mg po bid for agitation/psychosis. Continue Hydroxyzine 25 mg po tid prn for anxiety.  Continue Effexor XR 75 mg po daily for depression.   Other medical issues.  Discontinued Propranolol 10 mg po bid for elevated heart rate. Continue albuterol inhaler 1-2 puffs Q 4 hrs prn for sob. Continue Dulera 200--5 MCG 2 puffs bid for asthma. Continue Verapamil 40 mg po daily for HTN.  Continue Verapamil 120 mg po Q hs for HTN.  Continue Vitam D3 4.000 units po daily for bone health.  Continue loratadine 10 mg po daily for allergies.   Other PRNS -Continue Tylenol 650 mg every 6 hours PRN for mild pain -Continue Maalox 30 ml Q 4 hrs PRN for indigestion -Continue MOM 30 ml po Q 6 hrs for constipation.  -Continue Zofran-odt 4 mg po Q 8 hrs for N/V.   Safety and Monitoring: Voluntary admission to inpatient psychiatric unit for safety, stabilization and treatment Daily contact with patient to assess and evaluate symptoms and progress in treatment Patient's case to be discussed in multi-disciplinary team  meeting Observation Level : q15 minute checks Vital signs: q12 hours Precautions: Safety   Discharge Planning: Social work and case management to assist with discharge planning and identification of hospital follow-up needs prior to discharge Estimated LOS: 5-7 days Discharge Concerns: Need to establish a safety plan; Medication compliance and effectiveness Discharge Goals: Return home with outpatient referrals for mental health follow-up including medication management/psychotherapy   Armandina StammerAgnes Ayline Dingus, NP, pmhnp, 08/19/2022, 1:34 PMPatient ID: Brittany JacobMacy P Stemmler, female   DOB: [birth year], 26 y.o.   MRN: [id number]

## 2022-08-19 NOTE — BHH Group Notes (Signed)
Adult Psychoeducational Group Note  Date:  08/19/2022 Time:  9:23 AM  Group Topic/Focus:  Emotional Education:   The focus of this group is to discuss what feelings/emotions are, and how they are experienced. Goals Group:   The focus of this group is to help patients establish daily goals to achieve during treatment and discuss how the patient can incorporate goal setting into their daily lives to aide in recovery. Healthy Communication:   The focus of this group is to discuss communication, barriers to communication, as well as healthy ways to communicate with others.  Participation Level:  Active  Participation Quality:  Appropriate and Attentive  Affect:  Appropriate  Cognitive:  Alert and Appropriate  Insight: Appropriate and Good  Engagement in Group:  Developing/Improving and Engaged  Modes of Intervention:  Discussion, Education, and Exploration  Additional Comments: N/A  Kyley Solow R  RN 

## 2022-08-19 NOTE — Progress Notes (Signed)
Adult Psychoeducational Group Note  Date:  08/19/2022 Time:  9:25 PM  Group Topic/Focus:  AA group  Participation Level:  Active  Participation Quality:  Appropriate  Affect:  Appropriate  Cognitive:  Appropriate  Insight: Appropriate  Engagement in Group:  Engaged  Modes of Intervention:  Discussion, Education, and Support  Additional Comments:   Pt attended and engaged in the AA/ Wrap Up group.   Brittany Palmer Hilda Victory Strollo 08/19/2022, 9:25 PM

## 2022-08-19 NOTE — Progress Notes (Signed)
   08/19/22 0611  Sleep  Number of Hours 8.5

## 2022-08-19 NOTE — Progress Notes (Signed)
D:  Patient's self inventory sheet, patient sleeps good, no sleep medication.  Good appetite, low energy level, good concentration.  Rated depression 6, hopeless 3, anxiety 2.  Denied withdrawals.  SI, contracts for safety.  Physical problems, shaking.  Denied physical pain.  Patient plans to use more coping plans.  Plans to stay occupied.    A:  Medications administered per MD orders. Emotional support and encouragement given  patient. R:  Safety maintained with 15 minute checks.  Denied SI and HI, contracts for safety.per MD orders.

## 2022-08-19 NOTE — Group Note (Signed)
Recreation Therapy Group Note   Group Topic:Other  Group Date: 08/19/2022 Start Time: 0935 End Time: 1010 Facilitators: Ileigh Mettler-McCall, LRT,CTRS Location: [address]   Goal Area(s) Addresses:  Patient will participate in the creative process to complete all crafts.  Patient will interact pro-socially with staff and peers. Patient will share traditions, activities, and positive feelings produced during the holidays.   Group Description: LRT facilitated a therapeutic art activity to encourage self-expression and creativity in recognition of the approaching holidays. Writer explained that the folded paper icicles and chalk drawings created in session will be used to decorate the unit to boost mood and create a positive atmosphere. Patients were encouraged to engage in leisure discussions about festive activities and hobbies they like to participate in during the winter season.   Affect/Mood: Appropriate   Participation Level: Engaged   Participation Quality: Independent   Behavior: Appropriate   Speech/Thought Process: Focused   Insight: Good   Judgement: Good   Modes of Intervention: Art   Patient Response to Interventions:  Engaged   Education Outcome:  Acknowledges education and In group clarification offered    Clinical Observations/Individualized Feedback: Pt was attentive and engaged with activity.  Pt was social with peers and was focused on completing task.      Plan: Continue to engage patient in RT group sessions 2-3x/week.   Sharunda Salmon-McCall, LRT,CTRS  08/19/2022 11:18 AM

## 2022-08-19 NOTE — Plan of Care (Signed)
  Problem: Education: Goal: Emotional status will improve Outcome: Progressing   Problem: Education: Goal: Mental status will improve Outcome: Progressing   Problem: Safety: Goal: Periods of time without injury will increase Outcome: Progressing   Problem: Coping: Goal: Coping ability will improve Outcome: Progressing   Problem: Safety: Goal: Ability to disclose and discuss suicidal ideas will improve Outcome: Progressing

## 2022-08-19 NOTE — Plan of Care (Signed)
  Problem: Activity: Goal: Interest or engagement in activities will improve Outcome: Progressing   Problem: Education: Goal: Emotional status will improve Outcome: Not Progressing Goal: Mental status will improve Outcome: Not Progressing   

## 2022-08-19 NOTE — Progress Notes (Signed)
   At approximately 2100, MHT approached writer with a note from the patient.    Patient wrote:  "Having trouble breathing, I lost my voice earlier today, I tried a cough drop and my rescue inhaler, I take Seroquel for sleep."  Writer approached patient with note to assess and confirm.  Vitals were obtained.  BP elevated 169/99 (standing), 152/116 (sitting).  Notified Provider. Provider will come to evaluate.

## 2022-08-20 ENCOUNTER — Inpatient Hospital Stay (HOSPITAL_COMMUNITY): Payer: Medicare Other

## 2022-08-20 LAB — CBC WITH DIFFERENTIAL/PLATELET
Abs Immature Granulocytes: 0.01 10*3/uL (ref 0.00–0.07)
Basophils Absolute: 0 10*3/uL (ref 0.0–0.1)
Basophils Relative: 0 %
Eosinophils Absolute: 0.1 10*3/uL (ref 0.0–0.5)
Eosinophils Relative: 1 %
HCT: 38 % (ref 36.0–46.0)
Hemoglobin: 12.8 g/dL (ref 12.0–15.0)
Immature Granulocytes: 0 %
Lymphocytes Relative: 22 %
Lymphs Abs: 1.6 10*3/uL (ref 0.7–4.0)
MCH: 30.4 pg (ref 26.0–34.0)
MCHC: 33.7 g/dL (ref 30.0–36.0)
MCV: 90.3 fL (ref 80.0–100.0)
Monocytes Absolute: 0.4 10*3/uL (ref 0.1–1.0)
Monocytes Relative: 5 %
Neutro Abs: 5.1 10*3/uL (ref 1.7–7.7)
Neutrophils Relative %: 72 %
Platelets: 249 10*3/uL (ref 150–400)
RBC: 4.21 MIL/uL (ref 3.87–5.11)
RDW: 12.2 % (ref 11.5–15.5)
WBC: 7.2 10*3/uL (ref 4.0–10.5)
nRBC: 0 % (ref 0.0–0.2)

## 2022-08-20 LAB — TROPONIN I (HIGH SENSITIVITY)
Troponin I (High Sensitivity): 2 ng/L (ref <18)
Troponin I (High Sensitivity): <2 ng/L (ref <18)

## 2022-08-20 LAB — D-DIMER, QUANTITATIVE: D-Dimer, Quant: <0.27 ug/mL-FEU (ref 0.00–0.50)

## 2022-08-20 LAB — COMPREHENSIVE METABOLIC PANEL
ALT: 30 U/L (ref 0–44)
AST: 17 U/L (ref 15–41)
Albumin: 4.2 g/dL (ref 3.5–5.0)
Alkaline Phosphatase: 52 U/L (ref 38–126)
Anion gap: 7 (ref 5–15)
BUN: 12 mg/dL (ref 6–20)
CO2: 26 mmol/L (ref 22–32)
Calcium: 9 mg/dL (ref 8.9–10.3)
Chloride: 104 mmol/L (ref 98–111)
Creatinine, Ser: 0.77 mg/dL (ref 0.44–1.00)
GFR, Estimated: >60 mL/min (ref >60)
Glucose, Bld: 109 mg/dL — ABNORMAL HIGH (ref 70–99)
Potassium: 3.8 mmol/L (ref 3.5–5.1)
Sodium: 137 mmol/L (ref 135–145)
Total Bilirubin: 0.4 mg/dL (ref 0.3–1.2)
Total Protein: 7.2 g/dL (ref 6.5–8.1)

## 2022-08-20 LAB — RESP PANEL BY RT-PCR (RSV, FLU A&B, COVID)  RVPGX2
Influenza A by PCR: NEGATIVE
Influenza B by PCR: NEGATIVE
Resp Syncytial Virus by PCR: NEGATIVE
SARS Coronavirus 2 by RT PCR: NEGATIVE

## 2022-08-20 LAB — I-STAT BETA HCG BLOOD, ED (MC, WL, AP ONLY): I-stat hCG, quantitative: <5 m[IU]/mL (ref <5)

## 2022-08-20 MED ORDER — ACETAMINOPHEN 325 MG PO TABS
650.0000 mg | ORAL_TABLET | Freq: Once | ORAL | Status: AC
  Administered 2022-08-20: 650 mg via ORAL
  Filled 2022-08-20: qty 2

## 2022-08-20 MED ORDER — CLONIDINE HCL 0.1 MG PO TABS
0.1000 mg | ORAL_TABLET | ORAL | Status: DC | PRN
  Administered 2022-08-20: 0.1 mg via ORAL
  Filled 2022-08-20: qty 1

## 2022-08-20 NOTE — Plan of Care (Signed)
  Problem: Education: Goal: Knowledge of [facility] Education information/materials will improve Outcome: Progressing Goal: Emotional status will improve Outcome: Progressing Goal: Mental status will improve Outcome: Progressing Goal: Verbalization of understanding the information provided will improve Outcome: Progressing   Problem: Activity: Goal: Interest or engagement in activities will improve Outcome: Progressing Goal: Sleeping patterns will improve Outcome: Progressing   

## 2022-08-20 NOTE — Discharge Instructions (Addendum)
The workup today was overall reassuring.  As discussed, continue to use albuterol inhaler as needed for feelings of wheeze.  Recommend taking Motrin/Tylenol for feelings of chest pain.  Please do not hesitate to return to emergency department for worrisome signs and symptoms we discussed become apparent.   -Follow-up with your outpatient psychiatric provider -instructions on appointment date, time, and address (location) are provided to you in discharge paperwork.  -Take your psychiatric medications as prescribed at discharge - instructions are provided to you in the discharge paperwork Your prescriptions will be printed, so that your group home can get them filled for you.   -Follow-up with outpatient primary care doctor and other specialists -for management of preventative medicine and any chronic medical disease.  -Recommend abstinence from alcohol, tobacco, and other illicit drug use at discharge.   -If your psychiatric symptoms recur, worsen, or if you have side effects to your psychiatric medications, call your outpatient psychiatric provider, [phone] or go to the nearest emergency department.  -If suicidal thoughts occur, call your outpatient psychiatric provider, [phone] or go to the nearest emergency department.

## 2022-08-20 NOTE — Group Note (Signed)
LCSW Group Therapy Note   Group Date: 08/20/2022 Start Time: 1200 End Time: 1300   Type of Therapy and Topic:  Group Therapy: Challenging Core Beliefs  Participation Level:  Active  Description of Group:  Patients were educated about core beliefs and asked to identify one harmful core belief that they have. Patients were asked to explore from where those beliefs originate. Patients were asked to discuss how those beliefs make them feel and the resulting behaviors of those beliefs. They were then be asked if those beliefs are true and, if so, what evidence they have to support them. Lastly, group members were challenged to replace those negative core beliefs with helpful beliefs.   Therapeutic Goals:   1. Patient will identify harmful core beliefs and explore the origins of such beliefs. 2. Patient will identify feelings and behaviors that result from those core beliefs. 3. Patient will discuss whether such beliefs are true. 4.  Patient will replace harmful core beliefs with helpful ones.  Summary of Patient Progress:  Brittany Palmer actively engaged in processing and exploring how core beliefs are formed and how they impact thoughts, feelings, and behaviors. Patient proved open to input from peers and feedback from CSW. Patient demonstrated appropriate insight into the subject matter, was respectful and supportive of peers, and participated throughout the entire session.  Therapeutic Modalities: Cognitive Behavioral Therapy; Solution-Focused Therapy   Ane Payment, LCSW 08/20/2022  2:46 PM

## 2022-08-20 NOTE — Group Note (Signed)
Recreation Therapy Group Note   Group Topic:Animal Assisted Therapy   Group Date: 08/20/2022 Start Time: 1430 End Time: 1515 Facilitators: Danish Ruffins-McCall, LRT,CTRS Location: [address]   Animal-Assisted Activity (AAA) Program Checklist/Progress Notes Patient Eligibility Criteria Checklist & Daily Group note for Rec Tx Intervention  AAA/T Program Assumption of Risk Form signed by Patient/ or Parent Legal Guardian Yes  Patient is free of allergies or severe asthma Yes  Patient reports no fear of animals Yes  Patient reports no history of cruelty to animals Yes  Patient understands his/her participation is voluntary Yes  Patient washes hands before animal contact Yes  Patient washes hands after animal contact Yes   Affect/Mood: Appropriate   Participation Level: Engaged   Participation Quality: Independent   Behavior: Appropriate    Clinical Observations/Individualized Feedback:  Pt attended and participated in group session.    Plan: Continue to engage patient in RT group sessions 2-3x/week.   Allayna Erlich-McCall, LRT,CTRS 08/20/2022 3:26 PM

## 2022-08-20 NOTE — Progress Notes (Signed)
   08/19/22 2200  Vital Signs  Pulse Rate (!) 123  Pulse Rate Source Monitor  BP (!) 149/96  BP Location Right Arm  BP Method Automatic  Patient Position (if appropriate) Sitting  Oxygen Therapy  SpO2 98 %  Pain Assessment  Pain Scale 0-10  Pain Score 0    BP rechecked.  Patient remains anxious.  Patient is in her room and safe with Q 15 minute safety checks.

## 2022-08-20 NOTE — ED Provider Triage Note (Signed)
Emergency Medicine Provider Triage Evaluation Note  Brittany Palmer , a 26 y.o. female  was evaluated in triage.  Pt complains of chest pain and shortness of breath.  Patient coming from behavioral health side [facility] with plans to return after medical clearance.  Patient states that symptoms have been mostly constant for the past day.  Reports history of asthma which symptoms have not been relieved by albuterol inhaler.  Denies fever, cough, congestion, abdominal pain, nausea, vomiting, history of PE/DVT.Marland Kitchen  Review of Systems  Positive: See above Negative:   Physical Exam  BP (!) 143/106 (BP Location: Right Arm)   Pulse 100   Temp 100.2 F (37.9 C) (Oral)   Resp 16   Ht 5\' 5"  (1.651 m)   Wt 67.8 kg   SpO2 100%   BMI 24.88 kg/m  Gen:   Awake, no distress   Resp:  Normal effort  MSK:   Moves extremities without difficulty  Other:  No obvious murmurs gallops or rubs.  No obvious wheeze, rales, rhonchi.  No lower extremity edema appreciated.  Medical Decision Making  Medically screening exam initiated at 5:01 PM.  Appropriate orders placed.  ALAWNA GRAYBEAL was informed that the remainder of the evaluation will be completed by another provider, this initial triage assessment does not replace that evaluation, and the importance of remaining in the ED until their evaluation is complete.     Hedy Jacob, Peter Garter 08/20/22 [phone]

## 2022-08-20 NOTE — Progress Notes (Signed)
   08/20/22 0612  Sleep  Number of Hours 7

## 2022-08-20 NOTE — ED Provider Notes (Signed)
[facility]-EMERGENCY DEPT Provider Note   CSN: [id number] Arrival date & time: 08/20/22  1638     History  No chief complaint on file.   Brittany Palmer is a 26 y.o. female.  HPI   26 year old female presents emergency department with complaints of chest pain and shortness of breath.  Patient coming from [facility] health side [facility] with plans to return after medical clearance.  Patient states that symptoms have been mostly constant for the past day.  Reports history of asthma which symptoms have been relieved some by albuterol inhaler.  Denies fever, cough, congestion, abdominal pain, nausea, vomiting, history of PE/DVT, recent surgery/immobilization, lower extremity swelling.  Past medical history significant for asthma, autism, GERD, hypertension  Home Medications Prior to Admission medications   Medication Sig Start Date End Date Taking? Authorizing Provider  acetaminophen (TYLENOL) 160 MG/5ML suspension Take 640 mg by mouth every 4 (four) hours as needed for mild pain or fever.    [provider]  albuterol (PROVENTIL HFA;VENTOLIN HFA) 108 (90 Base) MCG/ACT inhaler Inhale 2 puffs into the lungs every 6 (six) hours as needed for wheezing or shortness of breath (or coughing).    [provider]  aluminum-magnesium hydroxide 200-200 MG/5ML suspension Take 15-30 mLs by mouth every 2 (two) hours as needed for indigestion (15 ml's if under 100 pounds and 30 ml's if over 100 pounds).    [provider]  ARIPiprazole (ABILIFY) 15 MG tablet Take 15 mg by mouth in the morning.    [provider]  bisacodyl (DULCOLAX) 10 MG suppository Place 10 mg rectally daily as needed for mild constipation (if no B/M in 72 hours).    [provider]  budesonide-formoterol (SYMBICORT) 160-4.5 MCG/ACT inhaler Inhale 2 puffs into the lungs 2 (two) times daily.    [provider]  Cholecalciferol (VITAMIN D3) 50 MCG (2000 UT)  capsule Take 4,000 Units by mouth daily.    [provider]  diphenhydrAMINE (BENADRYL) 12.5 MG/5ML elixir Take 25 mg by mouth every 4 (four) hours as needed for itching or allergies (or nasal congestion).    [provider]  guaifenesin (ROBITUSSIN) 100 MG/5ML syrup Take 300 mg by mouth every 4 (four) hours as needed for cough.    [provider]  hydrocortisone cream 1 % Apply 1 Application topically as needed for itching (or bug bites, skin irritation, rashes, chapped skin- affected areas).    [provider]  hydrOXYzine (VISTARIL) 50 MG capsule Take 50 mg by mouth every 8 (eight) hours as needed ("for behaviors not controlled by BSP lasting longer than 5 minutes").    [provider]  lamoTRIgine (LAMICTAL) 25 MG tablet Take 50 mg by mouth in the morning.    [provider]  loperamide (IMODIUM A-D) 2 MG tablet Take 2-4 mg by mouth See admin instructions. Take 4 mg by mouth for initial dose of diarrhea and 2 mg after each subsequent loose stool- cannot exceed a total of 16 mg/24 hours    [provider]  loratadine (CLARITIN) 10 MG tablet Take 10 mg by mouth daily.    [provider]  magnesium hydroxide (MILK OF MAGNESIA) 400 MG/5ML suspension Take 15-30 mLs by mouth daily as needed for mild constipation (15 ml's if under 100 pounds and 30 ml's if over 100 pounds- if no relief from Dulcolax suppository).    [provider]  mometasone-formoterol (DULERA) 100-5 MCG/ACT AERO Inhale 2 puffs into the lungs  2 (two) times daily. Patient not taking: Reported on 08/14/2022 05/25/18   Shari Prows, MD  neomycin-bacitracin-polymyxin [phone] OINT Apply 1 Application topically See admin instructions. Apply twice daily to superficial abrasions and lacerations after cleansing. Cover with a dressing.    [provider]  promethazine (PHENERGAN) 25 MG tablet Take 25 mg by mouth See admin instructions. Take 25 mg  by mouth every 4-6 hours as needed for vomiting 2 or more times in four hours and call MD if symptoms persist    [provider]  QUEtiapine (SEROQUEL) 400 MG tablet Take 400 mg by mouth at bedtime.    [provider]  QUEtiapine (SEROQUEL) 50 MG tablet Take 50 mg by mouth See admin instructions. Take 50 mg by mouth in the morning and at noon    [provider]  sodium phosphate (FLEET) 7-19 GM/118ML ENEM Place 1 enema rectally daily as needed (for constipation if no results from Dulcolax suppository and milk of magnesia).    [provider]  verapamil (CALAN) 120 MG tablet Take 120 mg by mouth in the morning and at bedtime.    [provider]  verapamil (CALAN) 40 MG tablet Take 40 mg by mouth daily at 12 noon.    [provider]  Vitamins A & D (VITAMIN A & D) ointment Apply 1 Application topically 3 (three) times daily as needed for dry skin (or skin irritation, rashes, or chapped skin).    [provider]      Allergies    Fluoxetine, Inderal la [propranolol hcl], Inderal [propranolol], Lisinopril, and Risperidone    Review of Systems   Review of Systems  All other systems reviewed and are negative.   Physical Exam Updated Vital Signs BP (!) 143/106 (BP Location: Right Arm)   Pulse 100   Temp 100.2 F (37.9 C) (Oral)   Resp 16   Ht 5\' 5"  (1.651 m)   Wt 67.8 kg   SpO2 100%   BMI 24.88 kg/m  Physical Exam Vitals and nursing note reviewed.  Constitutional:      General: She is not in acute distress.    Appearance: She is well-developed.  HENT:     Head: Normocephalic and atraumatic.  Eyes:     Conjunctiva/sclera: Conjunctivae normal.  Cardiovascular:     Rate and Rhythm: Normal rate and regular rhythm.     Heart sounds: No murmur heard. Pulmonary:     Effort: Pulmonary effort is normal. No respiratory distress.     Breath sounds: Normal breath sounds. No stridor. No rhonchi or rales.     Comments: Mild wheeze  auscultated. Abdominal:     Palpations: Abdomen is soft.     Tenderness: There is no abdominal tenderness. There is no right CVA tenderness or left CVA tenderness.  Musculoskeletal:        General: No swelling.     Cervical back: Neck supple.  Skin:    General: Skin is warm and dry.     Capillary Refill: Capillary refill takes less than 2 seconds.  Neurological:     Mental Status: She is alert.  Psychiatric:        Mood and Affect: Mood normal.     ED Results / Procedures / Treatments   Labs (all labs ordered are listed, but only abnormal results are displayed) Labs Reviewed  COMPREHENSIVE METABOLIC PANEL - Abnormal; Notable for the following components:      Result Value   Glucose, Bld 109 (*)  All other components within normal limits  RESP PANEL BY RT-PCR (RSV, FLU A&B, COVID)  RVPGX2  HEMOGLOBIN A1C  LIPID PANEL  TSH  CBC WITH DIFFERENTIAL/PLATELET  D-DIMER, QUANTITATIVE  I-STAT BETA HCG BLOOD, ED ([facility], [facility], [facility] ONLY)  I-STAT BETA HCG BLOOD, ED ([facility], [facility], [facility] ONLY)  TROPONIN I (HIGH SENSITIVITY)  TROPONIN I (HIGH SENSITIVITY)    EKG None  Radiology DG Chest 2 View  Result Date: 08/20/2022 CLINICAL DATA:  Chest pain and shortness of breath. EXAM: CHEST - 2 VIEW COMPARISON:  Chest x-ray 06/03/2022 FINDINGS: The heart size and mediastinal contours are within normal limits. Both lungs are clear. The visualized skeletal structures are unremarkable. IMPRESSION: No active cardiopulmonary disease. Electronically Signed   By: Darliss CheneyAmy  Guttmann M.D.   On: 08/20/2022 18:03    Procedures Procedures    Medications Ordered in ED Medications  cholecalciferol (VITAMIN D3) 25 MCG (1000 UNIT) tablet 4,000 Units (4,000 Units Oral Given 08/20/22 0822)  loratadine (CLARITIN) tablet 10 mg (10 mg Oral Given 08/20/22 0823)  mometasone-formoterol (DULERA) 200-5 MCG/ACT inhaler 2 puff (2 puffs Inhalation Given 08/20/22 0823)  ondansetron (ZOFRAN-ODT) disintegrating tablet 4 mg (has no  administration in time range)  QUEtiapine (SEROQUEL) tablet 400 mg (400 mg Oral Given 08/19/22 2121)  QUEtiapine (SEROQUEL) tablet 50 mg (50 mg Oral Given 08/20/22 1205)  verapamil (CALAN) tablet 120 mg (120 mg Oral Given 08/20/22 0823)  verapamil (CALAN) tablet 40 mg (40 mg Oral Given 08/20/22 1204)  acetaminophen (TYLENOL) tablet 650 mg (has no administration in time range)  alum & mag hydroxide-simeth (MAALOX/MYLANTA) 200-200-20 MG/5ML suspension 30 mL (30 mLs Oral Given 08/18/22 0737)  magnesium hydroxide (MILK OF MAGNESIA) suspension 30 mL (has no administration in time range)  hydrOXYzine (ATARAX) tablet 25 mg (25 mg Oral Given 08/20/22 1538)  albuterol (VENTOLIN HFA) 108 (90 Base) MCG/ACT inhaler 1-2 puff (2 puffs Inhalation Given 08/20/22 1529)  venlafaxine XR (EFFEXOR-XR) 24 hr capsule 75 mg (75 mg Oral Given 08/20/22 0823)  cloNIDine (CATAPRES) tablet 0.1 mg (0.1 mg Oral Given 08/20/22 1538)  acetaminophen (TYLENOL) tablet 650 mg (has no administration in time range)    ED Course/ Medical Decision Making/ A&P                           Medical Decision Making Amount and/or Complexity of Data Reviewed Labs: ordered. Radiology: ordered.  Risk OTC drugs.   This patient presents to the ED for concern of chest pain, this involves an extensive number of treatment options, and is a complaint that carries with it a high risk of complications and morbidity.  The differential diagnosis includes ACS, pulmonary, peritonitis/myocarditis/tamponade, pulmonary embolism, aortic dissection, pneumothorax, pneumonia, asthma exacerbation, CHF, MSK   Co morbidities that complicate the patient evaluation  See HPI   Additional history obtained:  Additional history obtained from EMR External records from outside source obtained and reviewed including hospital records   Lab Tests:  I Ordered, and personally interpreted labs.  The pertinent results include: No leukocytosis noted.  No  evidence of anemia.  Platelets within range.  No electrolyte abscess noted.  No transaminitis.  No renal dysfunction.  Respiratory viral panel negative.  D-dimer negative.  Initial troponin less than 2 with repeat less than 2; EKG concerning for sinus tachycardia with no acute ischemic changes.   Imaging Studies ordered:  I ordered imaging studies including chest x-ray I independently visualized and interpreted imaging which showed no acute cardiopulmonary  abnormalities I agree with the radiologist interpretation   Cardiac Monitoring: / EKG:  The patient was maintained on a cardiac monitor.  I personally viewed and interpreted the cardiac monitored which showed an underlying rhythm of: Sinus tachycardia with a rate of 108 with no acute ischemic changes.   Consultations Obtained:  N/a   Problem List / ED Course / Critical interventions / Medication management  Chest pain I ordered medication including Tylenol for pain, albuterol for wheeze f  Reevaluation of the patient after these medicines showed that the patient improved I have reviewed the patients home medicines and have made adjustments as needed   Social Determinants of Health:  Denies tobacco, licit drug use   Test / Admission - Considered:  Chest pain Vitals signs significant for initial tachycardia which did decrease within normal range with time elapsed all emergency department as well as Tylenol and albuterol ministration. Otherwise within normal range and stable throughout visit. Laboratory/imaging studies significant for: See above Patient with overall negative workup.  Doubt ACS/pericarditis/myocarditis/tamponade.  Doubt pulmonary embolism given negative D-dimer and lack of concerning findings on HPI.  Symptoms seem likely secondary to patient's asthma given wheeze as well as possible MSK etiology.  Will treat with albuterol inhaler as well as Tylenol/Motrin as needed for pain.  Patient cleared medically to go back  to behavioral health side of hospital.  Treatment plan discussed at length with patient and she acknowledged understanding was agreeable to said plan. Worrisome signs and symptoms were discussed with the patient, and the patient acknowledged understanding to return to the ED if noticed. Patient was stable upon transfer.         Final Clinical Impression(s) / ED Diagnoses Final diagnoses:  Other chest pain    Rx / DC Orders ED Discharge Orders     None         Peter Garter, [place] 08/20/22 2333    Bethann Berkshire, MD 08/29/22 1611

## 2022-08-20 NOTE — Progress Notes (Signed)
Adult Psychoeducational Group Note  Date:  08/20/2022 Time:  2:51 PM  Group Topic/Focus:  Orientation:   The focus of this group is to educate the patient on the purpose and policies of crisis stabilization and provide a format to answer questions about their admission.  The group details unit policies and expectations of patients while admitted.  Participation Level:  Active  Participation Quality:  Appropriate  Affect:  Appropriate  Cognitive:  Appropriate  Insight: Appropriate  Engagement in Group:  Engaged  Modes of Intervention:  Discussion  Additional Comments:  Pt attended the orientation group and remained appropriate and engaged throughout the duration of the group.   Sheran Lawless 08/20/2022, 2:51 PM

## 2022-08-20 NOTE — Progress Notes (Signed)
[facility] MD Progress Note  08/20/2022 4:45 PM VELA RENDER  MRN:  [id number]  Reason for admission: 26 year old Caucasian female. Patient is known in this [facility] from her previous admissions for mood stabilization treatments. She does have hx of autism spectrum & currently resides in a group home setting. Prior to this admission, she was receiving mental health care on outpatient basis at the [facility] in [place], [place]. She is admitted to the [facility] this time around with complain of worsening symptoms of depression & self-mutilating behaviors. She was brought to the hospital for evaluation & mood stabilization treatments.   Daily notes: [name] is seen in her room this am. She presents alert. She was whispering & using gestures to communicate. She says she lost her voice since yesterday afternoon. She says this happens to her sometimes, unable to remember the last time she experienced this. She also can write out her thoughts on a piece of paper. She says when this happens to her, it can last from few days to a week or so, but it varies. She says she is still depressed. Rates her depression #6. Denies anxiety symptoms, rates anxiety #0. She says she slept well last night. She is taking & tolerating her treatment regimen, denies any side effects. Later this afternoon at around 3:00 pm, Riko started complaining of chest pain by placing her right hand to her chest. She continues to whisper. Her blood pressure then was 162/115, pulse rate 126 & 02 sat at 98% in room air. Was ordered clonidine 0.1 mg x 1 previously for elevated blood pressure. This administered. An order for consults was placed for her blood pressure to be evaluated by a primary care hospitalist to make recommendations. However, when she started complaining of chest, patient was sent to the [facility] ED for medical evaluation & treatments by EMS. Her current vital signs remains elevated: 152/118, pulse rate 134. While awaiting the EMS to arrive, patient  remained conscious, alert, oriented & aware of situation. Her color remained pinkish & her sat was 98% in room air. Patient is currently being evaluated at the ED. She will return back to the [facility] when medically stabilized.  Collateral information from patient's mother: Brittany Palmer [phone]: "Brittany Palmer is my daughter, but has not lived with me in 10 years. She lives in a group home. I do not know the group home's phone #. All I can tell you is Brittany Palmer complains of suicidal ideations a lot & ha been in & out of mental health institutions a lot. As for her day to day behaviors & other stuff, I can't help you on those".  We will continue current plan of care as already in progress. No changes made.  Principal Problem: Severe recurrent major depressive disorder with psychotic features (HCC)  Diagnosis: Principal Problem:   Severe recurrent major depressive disorder with psychotic features (HCC) Active Problems:   Autism spectrum disorder   HTN (hypertension)  Total Time spent with patient:  35 minutes  Past Psychiatric History: See H&P.  Past Medical History:  Past Medical History:  Diagnosis Date   Asthma    Autism    GERD (gastroesophageal reflux disease)    Hypertension     Past Surgical History:  Procedure Laterality Date   CYSTECTOMY     DENTAL SURGERY     TONSILLECTOMY     Family History: History reviewed. No pertinent family history.  Family Psychiatric  History: See H&P.  Social History:  Social  History   Substance and Sexual Activity  Alcohol Use No     Social History   Substance and Sexual Activity  Drug Use No    Social History   Socioeconomic History   Marital status: Single    Spouse name: Not on file   Number of children: Not on file   Years of education: Not on file   Highest education level: Not on file  Occupational History   Not on file  Tobacco Use   Smoking status: Never   Smokeless tobacco: Never  Vaping Use   Vaping Use: Never used   Substance and Sexual Activity   Alcohol use: No   Drug use: No   Sexual activity: Never  Other Topics Concern   Not on file  Social History Narrative   Not on file   Social Determinants of Health   Financial Resource Strain: Not on file  Food Insecurity: Not on file  Transportation Needs: Not on file  Physical Activity: Not on file  Stress: Not on file  Social Connections: Not on file   Additional Social History:   Sleep: Good  Appetite:  Good  Current Medications: Current Facility-Administered Medications  Medication Dose Route Frequency Provider Last Rate Last Admin   acetaminophen (TYLENOL) tablet 650 mg  650 mg Oral Q6H PRN Motley-Mangrum, Jadeka A, PMHNP       albuterol (VENTOLIN HFA) 108 (90 Base) MCG/ACT inhaler 1-2 puff  1-2 puff Inhalation Q4H PRN Nkwenti, Doris, NP   2 puff at 08/20/22 1529   alum & mag hydroxide-simeth (MAALOX/MYLANTA) 200-200-20 MG/5ML suspension 30 mL  30 mL Oral Q4H PRN Motley-Mangrum, Jadeka A, PMHNP   30 mL at 08/18/22 0737   cholecalciferol (VITAMIN D3) 25 MCG (1000 UNIT) tablet 4,000 Units  4,000 Units Oral Daily Motley-Mangrum, Jadeka A, PMHNP   4,000 Units at 08/20/22 1610   cloNIDine (CATAPRES) tablet 0.1 mg  0.1 mg Oral Q4H PRN Massengill, Harrold Donath, MD   0.1 mg at 08/20/22 1538   hydrOXYzine (ATARAX) tablet 25 mg  25 mg Oral TID PRN Motley-Mangrum, Jadeka A, PMHNP   25 mg at 08/20/22 1538   loratadine (CLARITIN) tablet 10 mg  10 mg Oral Daily Motley-Mangrum, Jadeka A, PMHNP   10 mg at 08/20/22 0823   magnesium hydroxide (MILK OF MAGNESIA) suspension 30 mL  30 mL Oral Daily PRN Motley-Mangrum, Jadeka A, PMHNP       mometasone-formoterol (DULERA) 200-5 MCG/ACT inhaler 2 puff  2 puff Inhalation BID Motley-Mangrum, Jadeka A, PMHNP   2 puff at 08/20/22 0823   ondansetron (ZOFRAN-ODT) disintegrating tablet 4 mg  4 mg Oral Q8H PRN Motley-Mangrum, Jadeka A, PMHNP       QUEtiapine (SEROQUEL) tablet 400 mg  400 mg Oral QHS Motley-Mangrum, Jadeka A,  PMHNP   400 mg at 08/19/22 2121   QUEtiapine (SEROQUEL) tablet 50 mg  50 mg Oral BID Motley-Mangrum, Jadeka A, PMHNP   50 mg at 08/20/22 1205   venlafaxine XR (EFFEXOR-XR) 24 hr capsule 75 mg  75 mg Oral Q breakfast Anitra Doxtater, Nicole Kindred I, NP   75 mg at 08/20/22 0823   verapamil (CALAN) tablet 120 mg  120 mg Oral Q12H Motley-Mangrum, Jadeka A, PMHNP   120 mg at 08/20/22 0823   verapamil (CALAN) tablet 40 mg  40 mg Oral Q1200 Motley-Mangrum, Jadeka A, PMHNP   40 mg at 08/20/22 1204   Current Outpatient Medications  Medication Sig Dispense Refill   acetaminophen (TYLENOL) 160 MG/5ML suspension Take 640  mg by mouth every 4 (four) hours as needed for mild pain or fever.     albuterol (PROVENTIL HFA;VENTOLIN HFA) 108 (90 Base) MCG/ACT inhaler Inhale 2 puffs into the lungs every 6 (six) hours as needed for wheezing or shortness of breath (or coughing).     aluminum-magnesium hydroxide 200-200 MG/5ML suspension Take 15-30 mLs by mouth every 2 (two) hours as needed for indigestion (15 ml's if under 100 pounds and 30 ml's if over 100 pounds).     ARIPiprazole (ABILIFY) 15 MG tablet Take 15 mg by mouth in the morning.     bisacodyl (DULCOLAX) 10 MG suppository Place 10 mg rectally daily as needed for mild constipation (if no B/M in 72 hours).     budesonide-formoterol (SYMBICORT) 160-4.5 MCG/ACT inhaler Inhale 2 puffs into the lungs 2 (two) times daily.     Cholecalciferol (VITAMIN D3) 50 MCG (2000 UT) capsule Take 4,000 Units by mouth daily.     diphenhydrAMINE (BENADRYL) 12.5 MG/5ML elixir Take 25 mg by mouth every 4 (four) hours as needed for itching or allergies (or nasal congestion).     guaifenesin (ROBITUSSIN) 100 MG/5ML syrup Take 300 mg by mouth every 4 (four) hours as needed for cough.     hydrocortisone cream 1 % Apply 1 Application topically as needed for itching (or bug bites, skin irritation, rashes, chapped skin- affected areas).     hydrOXYzine (VISTARIL) 50 MG capsule Take 50 mg by mouth every 8  (eight) hours as needed ("for behaviors not controlled by BSP lasting longer than 5 minutes").     lamoTRIgine (LAMICTAL) 25 MG tablet Take 50 mg by mouth in the morning.     loperamide (IMODIUM A-D) 2 MG tablet Take 2-4 mg by mouth See admin instructions. Take 4 mg by mouth for initial dose of diarrhea and 2 mg after each subsequent loose stool- cannot exceed a total of 16 mg/24 hours     loratadine (CLARITIN) 10 MG tablet Take 10 mg by mouth daily.     magnesium hydroxide (MILK OF MAGNESIA) 400 MG/5ML suspension Take 15-30 mLs by mouth daily as needed for mild constipation (15 ml's if under 100 pounds and 30 ml's if over 100 pounds- if no relief from Dulcolax suppository).     mometasone-formoterol (DULERA) 100-5 MCG/ACT AERO Inhale 2 puffs into the lungs 2 (two) times daily. (Patient not taking: Reported on 08/14/2022) 1 Inhaler 1   neomycin-bacitracin-polymyxin [phone] OINT Apply 1 Application topically See admin instructions. Apply twice daily to superficial abrasions and lacerations after cleansing. Cover with a dressing.     promethazine (PHENERGAN) 25 MG tablet Take 25 mg by mouth See admin instructions. Take 25 mg by mouth every 4-6 hours as needed for vomiting 2 or more times in four hours and call MD if symptoms persist     QUEtiapine (SEROQUEL) 400 MG tablet Take 400 mg by mouth at bedtime.     QUEtiapine (SEROQUEL) 50 MG tablet Take 50 mg by mouth See admin instructions. Take 50 mg by mouth in the morning and at noon     sodium phosphate (FLEET) 7-19 GM/118ML ENEM Place 1 enema rectally daily as needed (for constipation if no results from Dulcolax suppository and milk of magnesia).     verapamil (CALAN) 120 MG tablet Take 120 mg by mouth in the morning and at bedtime.     verapamil (CALAN) 40 MG tablet Take 40 mg by mouth daily at 12 noon.     Vitamins A &  D (VITAMIN A & D) ointment Apply 1 Application topically 3 (three) times daily as needed for dry skin (or skin irritation, rashes,  or chapped skin).      Lab Results:  No results found for this or any previous visit (from the past 48 hour(s)).  Blood Alcohol level:  Lab Results  Component Value Date   ETH <10 08/12/2022   ETH <10 12/27/2021   Metabolic Disorder Labs: Lab Results  Component Value Date   HGBA1C 5.1 08/17/2022   MPG 100 08/17/2022   MPG 103 05/19/2018   Lab Results  Component Value Date   PROLACTIN 30.2 04/12/2011   Lab Results  Component Value Date   CHOL 128 08/17/2022   TRIG 55 08/17/2022   HDL 45 08/17/2022   CHOLHDL 2.8 08/17/2022   VLDL 11 08/17/2022   LDLCALC 72 08/17/2022   LDLCALC 56 05/19/2018    Physical Findings: AIMS:  , ,  ,  ,    CIWA:    COWS:     Musculoskeletal: Strength & Muscle Tone: within normal limits Gait & Station: normal Patient leans: N/A  Psychiatric Specialty Exam:  Presentation  General Appearance:  Appropriate for Environment; Casual; Fairly Groomed  Eye Contact: Good  Speech: Normal Rate; Clear and Coherent ((Presents with a deep voice (boy-like).))  Speech Volume: Normal  Handedness: Right  Mood and Affect  Mood: -- ("Improving")  Affect: Congruent  Thought Process  Thought Processes: Coherent  Descriptions of Associations:Intact  Orientation:Full (Time, Place and Person)  Thought Content:Perseveration; Obsessions  History of Schizophrenia/Schizoaffective disorder:Yes  Duration of Psychotic Symptoms:Greater than six months  Hallucinations:Hallucinations: None Description of Auditory Hallucinations: NA  Ideas of Reference:None  Suicidal Thoughts:Suicidal Thoughts: No SI Active Intent and/or Plan: Without Intent; Without Plan; Without Means to Carry Out; Without Access to Means SI Passive Intent and/or Plan: Without Intent; Without Plan; Without Means to Carry Out; Without Access to Means  Homicidal Thoughts:Homicidal Thoughts: No  Sensorium  Memory: Immediate Good; Recent Good; Remote  Good  Judgment: Fair  Insight: Fair   Chartered certified accountant: Fair  Attention Span: Fair  Recall: Fair  Fund of Knowledge: Fair; Good  Language: Good  Psychomotor Activity  Psychomotor Activity: Psychomotor Activity: Normal  Assets  Assets: Communication Skills; Desire for Improvement; Financial Resources/Insurance; Housing; Physical Health; Resilience; Social Support  Sleep  Sleep: Sleep: Good Number of Hours of Sleep: 8.5  Physical Exam: Physical Exam Vitals and nursing note reviewed.  HENT:     Nose: Nose normal.     Mouth/Throat:     Pharynx: Oropharynx is clear.  Eyes:     Pupils: Pupils are equal, round, and reactive to light.  Cardiovascular:     Rate and Rhythm: Normal rate.     Comments: Hx. Cardiac issues.  Pulse rate: Slightly elevated. Pulmonary:     Effort: Pulmonary effort is normal.  Genitourinary:    Comments: Deferred Musculoskeletal:        General: Normal range of motion.     Cervical back: Normal range of motion.  Skin:    General: Skin is warm and dry.  Neurological:     General: No focal deficit present.     Mental Status: She is alert and oriented to person, place, and time.    Review of Systems  Constitutional:  Negative for chills, diaphoresis and fever.  HENT:  Negative for congestion and sore throat.   Respiratory:  Negative for cough, shortness of breath and wheezing.  Cardiovascular:  Negative for chest pain and palpitations.  Gastrointestinal:  Negative for abdominal pain, constipation, diarrhea, heartburn, nausea and vomiting.  Musculoskeletal:  Negative for joint pain and myalgias.  Neurological:  Negative for dizziness, tingling, tremors, sensory change, speech change, focal weakness, seizures, loss of consciousness, weakness and headaches.  Endo/Heme/Allergies:        See allergy lists.  Psychiatric/Behavioral:  Positive for depression and hallucinations. Negative for memory loss, substance  abuse and suicidal ideas. The patient is nervous/anxious. The patient does not have insomnia.    Blood pressure (!) 152/118, pulse (!) 134, temperature 98.2 F (36.8 C), temperature source Oral, resp. rate 20, height 5\' 5"  (1.651 m), weight 67.8 kg, SpO2 98 %. Body mass index is 24.88 kg/m.  Treatment Plan Summary: Daily contact with patient to assess and evaluate symptoms and progress in treatment and Medication management.   Continue inpatient hospitalization.  Will continue today 08/20/2022 plan as below except where it is noted.   Diagnoses. Severe recurrent major depressive disorder with psychotic features (HCC)  Plan: Continue Seroquel 400 mg po Q bedtime for psychosis. Continue Seroquel 50 mg po bid for agitation/psychosis. Continue Hydroxyzine 25 mg po tid prn for anxiety.  Continue Effexor XR 75 mg po daily for depression.   Other medical issues.  Discontinued Propranolol 10 mg po bid for elevated heart rate. Continue albuterol inhaler 1-2 puffs Q 4 hrs prn for sob. Continue Dulera 200--5 MCG 2 puffs bid for asthma. Continue Verapamil 40 mg po daily for HTN.  Continue Verapamil 120 mg po Q hs for HTN.  Continue Vitam D3 4.000 units po daily for bone health.  Continue loratadine 10 mg po daily for allergies.  Clonidine 0.1 mg po once today (completed).   Other PRNS -Continue Tylenol 650 mg every 6 hours PRN for mild pain -Continue Maalox 30 ml Q 4 hrs PRN for indigestion -Continue MOM 30 ml po Q 6 hrs for constipation.  -Continue Zofran-odt 4 mg po Q 8 hrs for N/V.   Safety and Monitoring: Voluntary admission to inpatient psychiatric unit for safety, stabilization and treatment Daily contact with patient to assess and evaluate symptoms and progress in treatment Patient's case to be discussed in multi-disciplinary team meeting Observation Level : q15 minute checks Vital signs: q12 hours Precautions: Safety   Discharge Planning: Social work and case management to  assist with discharge planning and identification of hospital follow-up needs prior to discharge Estimated LOS: 5-7 days Discharge Concerns: Need to establish a safety plan; Medication compliance and effectiveness Discharge Goals: Return home with outpatient referrals for mental health follow-up including medication management/psychotherapy   08/22/2022, NP, pmhnp, 08/20/2022, 4:45 PMPatient ID: 08/22/2022, female   DOB: 03/04/1996, 26 y.o.   MRN: [id number] Patient ID: JOZI MALACHI, female   DOB: October 21, 1995, 26 y.o.   MRN: [id number]

## 2022-08-21 MED ORDER — MENTHOL 3 MG MT LOZG: 1.0000 | LOZENGE | OROMUCOSAL | Status: DC | PRN

## 2022-08-21 NOTE — Plan of Care (Signed)
  Problem: Self-Concept: Goal: Ability to disclose and discuss suicidal ideas will improve Outcome: Progressing   Problem: Education: Goal: Emotional status will improve Outcome: Not Progressing Goal: Mental status will improve Outcome: Not Progressing Goal: Verbalization of understanding the information provided will improve Outcome: Not Progressing

## 2022-08-21 NOTE — Progress Notes (Signed)
   08/21/22 0541  Sleep  Number of Hours 4.25

## 2022-08-21 NOTE — Group Note (Signed)
Recreation Therapy Group Note   Group Topic:Stress Management  Group Date: 08/21/2022 Start Time: 0935 End Time: 0952 Facilitators: Micah Barnier-McCall, LRT,CTRS Location: [address] Dayroom   Goal Area(s) Addresses:  Patient will actively participate in stress management techniques presented during session.  Patient will successfully identify benefit of practicing stress management post d/c.   Group Description: Guided Imagery. LRT provided education, instruction, and demonstration on practice of visualization via guided imagery. Patient was asked to participate in the technique introduced during session. LRT debriefed including topics of mindfulness, stress management and specific scenarios each patient could use these techniques. Patients were given suggestions of ways to access scripts post d/c and encouraged to explore Youtube and other apps available on smartphones, tablets, and computers.   Affect/Mood: Appropriate   Participation Level: Engaged   Participation Quality: Independent   Behavior: Appropriate   Speech/Thought Process: Focused   Insight: Good   Judgement: Good   Modes of Intervention: Script   Patient Response to Interventions:  Attentive   Education Outcome:  Acknowledges education and In group clarification offered    Clinical Observations/Individualized Feedback: Pt attended and participated in group session.      Plan: Continue to engage patient in RT group sessions 2-3x/week.   Vonita Calloway-McCall, LRT,CTRS 08/21/2022 12:07 PM

## 2022-08-21 NOTE — [facility] Group Notes (Signed)
Patient attended the NA group. ?

## 2022-08-21 NOTE — Plan of Care (Signed)
Nurse discussed coping skills to patient.

## 2022-08-21 NOTE — Progress Notes (Deleted)
   08/21/22 0541  Sleep  Number of Hours 2

## 2022-08-21 NOTE — Progress Notes (Signed)
   08/21/22 0100  Psych Admission Type (Psych Patients Only)  Admission Status Involuntary  Psychosocial Assessment  Patient Complaints Self-harm thoughts;Depression  Eye Contact Fair  Facial Expression Anxious;Worried  Affect Anxious;Depressed  Speech Elective mutism  Interaction Assertive  Motor Activity Slow  Appearance/Hygiene Unremarkable  Behavior Characteristics Anxious  Mood Anxious  Thought Process  Coherency Unable to assess (Unable to speak)  Content WDL  Delusions None reported or observed  Perception Hallucinations  Hallucination Auditory;Visual  Judgment Limited  Confusion None  Danger to Self  Current suicidal ideation? Passive  Description of Suicide Plan Patient made the gesture of cutting her throat  Self-Injurious Behavior No self-injurious ideation or behavior indicators observed or expressed   Agreement Not to Harm Self Yes  Description of Agreement Patient unable to speak but nodded head when asked  Danger to Others  Danger to Others None reported or observed  Danger to Others Abnormal  Harmful Behavior to others No threats or harm toward other people

## 2022-08-21 NOTE — Progress Notes (Signed)
[facility] MD Progress Note  08/21/2022 10:12 AM KAREY SCHILKE  MRN:  [id number]  Reason for admission: 26 year old Caucasian female. Patient is known in this [facility] from her previous admissions for mood stabilization treatments. She does have hx of autism spectrum & currently resides in a group home setting. Prior to this admission, she was receiving mental health care on outpatient basis at the [facility] in [place], [place]. She is admitted to the [facility] this time around with complain of worsening symptoms of depression & self-mutilating behaviors. She was brought to the hospital for evaluation & mood stabilization treatments.   Daily notes: Willemina is seen in her room. Chart reviewed. The chart findings discussed with the treatment team. She presents alert, oriented & aware of situation. She remains selectively mute. She communicates using gestures & whispers. She reports today by writing on a piece of paper, "I'm spaced out. I feel emotionless. My depression is #4 today & anxiety #2. I slept good last night & my appetite is good. Overall, I can breath better but still unable to speak. While at the ED yesterday, they did lab work, EKG & c-xray, all were normal. They gave me some medicines. To help me, please give me something to help me gain my voice back".  Rayah currently denies any cough, son, sore throat or swallowing problems. She does not appear to be in any apparent distress. She is visible on the unit, attending group sessions. Vital reveal a normal blood pressure & pulse rate is elevated 108. She is taking & tolerating her treatment regimen. Denies side effects. Will continue current plan of care as already in progress.  Collateral information from patient's mother: Mickle Asper [phone]: "Hinton Dyer is my daughter, but has not lived with me in 32 years. She lives in a group home. I do not know the group home's phone #. All I can tell you is Hinton Dyer complains of suicidal ideations a lot & ha been in & out of mental  health institutions a lot. As for her day to day behaviors & other stuff, I can't help you on those".  We will continue current plan of care as already in progress. No changes made.  Principal Problem: Severe recurrent major depressive disorder with psychotic features ([place])  Diagnosis: Principal Problem:   Severe recurrent major depressive disorder with psychotic features ([place]) Active Problems:   Autism spectrum disorder   HTN (hypertension)  Total Time spent with patient:  35 minutes  Past Psychiatric History: See H&P.  Past Medical History:  Past Medical History:  Diagnosis Date   Asthma    Autism    GERD (gastroesophageal reflux disease)    Hypertension     Past Surgical History:  Procedure Laterality Date   CYSTECTOMY     DENTAL SURGERY     TONSILLECTOMY     Family History: History reviewed. No pertinent family history.  Family Psychiatric  History: See H&P.  Social History:  Social History   Substance and Sexual Activity  Alcohol Use No     Social History   Substance and Sexual Activity  Drug Use No    Social History   Socioeconomic History   Marital status: Single    Spouse name: Not on file   Number of children: Not on file   Years of education: Not on file   Highest education level: Not on file  Occupational History   Not on file  Tobacco Use   Smoking status: Never  Smokeless tobacco: Never  Vaping Use   Vaping Use: Never used  Substance and Sexual Activity   Alcohol use: No   Drug use: No   Sexual activity: Never  Other Topics Concern   Not on file  Social History Narrative   Not on file   Social Determinants of Health   Financial Resource Strain: Not on file  Food Insecurity: Not on file  Transportation Needs: Not on file  Physical Activity: Not on file  Stress: Not on file  Social Connections: Not on file   Additional Social History:   Sleep: Good  Appetite:  Good  Current Medications: Current Facility-Administered  Medications  Medication Dose Route Frequency Provider Last Rate Last Admin   acetaminophen (TYLENOL) tablet 650 mg  650 mg Oral Q6H PRN Motley-Mangrum, Jadeka A, PMHNP       albuterol (VENTOLIN HFA) 108 (90 Base) MCG/ACT inhaler 1-2 puff  1-2 puff Inhalation Q4H PRN Nicholes Rough, NP   2 puff at 08/21/22 0638   alum & mag hydroxide-simeth (MAALOX/MYLANTA) 200-200-20 MG/5ML suspension 30 mL  30 mL Oral Q4H PRN Motley-Mangrum, Jadeka A, PMHNP   30 mL at 08/18/22 0737   cloNIDine (CATAPRES) tablet 0.1 mg  0.1 mg Oral Q4H PRN Massengill, Ovid Curd, MD   0.1 mg at 08/20/22 1538   hydrOXYzine (ATARAX) tablet 25 mg  25 mg Oral TID PRN Motley-Mangrum, Jadeka A, PMHNP   25 mg at 08/20/22 1538   loratadine (CLARITIN) tablet 10 mg  10 mg Oral Daily Motley-Mangrum, Jadeka A, PMHNP   10 mg at 08/21/22 0809   magnesium hydroxide (MILK OF MAGNESIA) suspension 30 mL  30 mL Oral Daily PRN Motley-Mangrum, Jadeka A, PMHNP       mometasone-formoterol (DULERA) 200-5 MCG/ACT inhaler 2 puff  2 puff Inhalation BID Motley-Mangrum, Jadeka A, PMHNP   2 puff at 08/21/22 0809   ondansetron (ZOFRAN-ODT) disintegrating tablet 4 mg  4 mg Oral Q8H PRN Motley-Mangrum, Jadeka A, PMHNP       QUEtiapine (SEROQUEL) tablet 400 mg  400 mg Oral QHS Motley-Mangrum, Jadeka A, PMHNP   400 mg at 08/20/22 2354   QUEtiapine (SEROQUEL) tablet 50 mg  50 mg Oral BID Motley-Mangrum, Jadeka A, PMHNP   50 mg at 08/21/22 0810   venlafaxine XR (EFFEXOR-XR) 24 hr capsule 75 mg  75 mg Oral Q breakfast Basheer Molchan, Herbert Pun I, NP   75 mg at 08/21/22 0810   verapamil (CALAN) tablet 120 mg  120 mg Oral Q12H Motley-Mangrum, Jadeka A, PMHNP   120 mg at 08/21/22 0810   verapamil (CALAN) tablet 40 mg  40 mg Oral Q1200 Motley-Mangrum, Jadeka A, PMHNP   40 mg at 08/20/22 1204   vitamin D3 (CHOLECALCIFEROL) tablet 4,000 Units  4,000 Units Oral Daily Motley-Mangrum, Jadeka A, PMHNP   4,000 Units at 08/21/22 [id number]    Lab Results:  Results for orders placed or performed during  the hospital encounter of 08/16/22 (from the past 48 hour(s))  Resp panel by RT-PCR (RSV, Flu A&B, Covid) Anterior Nasal Swab     Status: None   Collection Time: 08/20/22  5:14 PM   Specimen: Anterior Nasal Swab  Result Value Ref Range   SARS Coronavirus 2 by RT PCR NEGATIVE NEGATIVE    Comment: (NOTE) SARS-CoV-2 target nucleic acids are NOT DETECTED.  The SARS-CoV-2 RNA is generally detectable in upper respiratory specimens during the acute phase of infection. The lowest concentration of SARS-CoV-2 viral copies this assay can detect is 138 copies/mL. A  negative result does not preclude SARS-Cov-2 infection and should not be used as the sole basis for treatment or other patient management decisions. A negative result may occur with  improper specimen collection/handling, submission of specimen other than nasopharyngeal swab, presence of viral mutation(s) within the areas targeted by this assay, and inadequate number of viral copies(<138 copies/mL). A negative result must be combined with clinical observations, patient history, and epidemiological information. The expected result is Negative.  Fact Sheet for Patients:  EntrepreneurPulse.com.au  Fact Sheet for Healthcare Providers:  IncredibleEmployment.be  This test is no t yet approved or cleared by the [place] FDA and  has been authorized for detection and/or diagnosis of SARS-CoV-2 by FDA under an Emergency Use Authorization (EUA). This EUA will remain  in effect (meaning this test can be used) for the duration of the COVID-19 declaration under Section 564(b)(1) of the Act, 21 U.S.C.section 360bbb-3(b)(1), unless the authorization is terminated  or revoked sooner.       Influenza A by PCR NEGATIVE NEGATIVE   Influenza B by PCR NEGATIVE NEGATIVE    Comment: (NOTE) The Xpert Xpress SARS-CoV-2/FLU/RSV plus assay is intended as an aid in the diagnosis of influenza from Nasopharyngeal  swab specimens and should not be used as a sole basis for treatment. Nasal washings and aspirates are unacceptable for Xpert Xpress SARS-CoV-2/FLU/RSV testing.  Fact Sheet for Patients: EntrepreneurPulse.com.au  Fact Sheet for Healthcare Providers: IncredibleEmployment.be  This test is not yet approved or cleared by the [place] FDA and has been authorized for detection and/or diagnosis of SARS-CoV-2 by FDA under an Emergency Use Authorization (EUA). This EUA will remain in effect (meaning this test can be used) for the duration of the COVID-19 declaration under Section 564(b)(1) of the Act, 21 U.S.C. section 360bbb-3(b)(1), unless the authorization is terminated or revoked.     Resp Syncytial Virus by PCR NEGATIVE NEGATIVE    Comment: (NOTE) Fact Sheet for Patients: EntrepreneurPulse.com.au  Fact Sheet for Healthcare Providers: IncredibleEmployment.be  This test is not yet approved or cleared by the [place] FDA and has been authorized for detection and/or diagnosis of SARS-CoV-2 by FDA under an Emergency Use Authorization (EUA). This EUA will remain in effect (meaning this test can be used) for the duration of the COVID-19 declaration under Section 564(b)(1) of the Act, 21 U.S.C. section 360bbb-3(b)(1), unless the authorization is terminated or revoked.  Performed at [facility], [place] [address], [place],  [address]   Comprehensive metabolic panel     Status: Abnormal   Collection Time: 08/20/22  5:20 PM  Result Value Ref Range   Sodium 137 135 - 145 mmol/L   Potassium 3.8 3.5 - 5.1 mmol/L   Chloride 104 98 - 111 mmol/L   CO2 26 22 - 32 mmol/L   Glucose, Bld 109 (H) 70 - 99 mg/dL    Comment: Glucose reference range applies only to samples taken after fasting for at least 8 hours.   BUN 12 6 - 20 mg/dL   Creatinine, Ser 0.77 0.44 - 1.00 mg/dL   Calcium 9.0 8.9 -  10.3 mg/dL   Total Protein 7.2 6.5 - 8.1 g/dL   Albumin 4.2 3.5 - 5.0 g/dL   AST 17 15 - 41 U/L   ALT 30 0 - 44 U/L   Alkaline Phosphatase 52 38 - 126 U/L   Total Bilirubin 0.4 0.3 - 1.2 mg/dL   GFR, Estimated >60 >60 mL/min    Comment: (NOTE) Calculated using the  CKD-EPI Creatinine Equation (2021)    Anion gap 7 5 - 15    Comment: Performed at [facility], [address] [place], [place] [address]  CBC with Diff     Status: None   Collection Time: 08/20/22  5:20 PM  Result Value Ref Range   WBC 7.2 4.0 - 10.5 K/uL   RBC 4.21 3.87 - 5.11 MIL/uL   Hemoglobin 12.8 12.0 - 15.0 g/dL   HCT 19.1 47.8 - 29.5 %   MCV 90.3 80.0 - 100.0 fL   MCH 30.4 26.0 - 34.0 pg   MCHC 33.7 30.0 - 36.0 g/dL   RDW 62.1 30.8 - 65.7 %   Platelets 249 150 - 400 K/uL   nRBC 0.0 0.0 - 0.2 %   Neutrophils Relative % 72 %   Neutro Abs 5.1 1.7 - 7.7 K/uL   Lymphocytes Relative 22 %   Lymphs Abs 1.6 0.7 - 4.0 K/uL   Monocytes Relative 5 %   Monocytes Absolute 0.4 0.1 - 1.0 K/uL   Eosinophils Relative 1 %   Eosinophils Absolute 0.1 0.0 - 0.5 K/uL   Basophils Relative 0 %   Basophils Absolute 0.0 0.0 - 0.1 K/uL   Immature Granulocytes 0 %   Abs Immature Granulocytes 0.01 0.00 - 0.07 K/uL    Comment: Performed at [facility], [address], [place], [place] [address]  Troponin I (High Sensitivity)     Status: None   Collection Time: 08/20/22  5:20 PM  Result Value Ref Range   Troponin I (High Sensitivity) 2 <18 ng/L    Comment: (NOTE) Elevated high sensitivity troponin I (hsTnI) values and significant  changes across serial measurements may suggest ACS but many other  chronic and acute conditions are known to elevate hsTnI results.  Refer to the "Links" section for chest pain algorithms and additional  guidance. Performed at [facility], [address] [place], [place] [address]   I-Stat beta hCG blood, ED     Status: None   Collection  Time: 08/20/22  5:28 PM  Result Value Ref Range   I-stat hCG, quantitative <5.0 <5 mIU/mL   Comment 3            Comment:   GEST. AGE      CONC.  (mIU/mL)   <=1 WEEK        5 - 50     2 WEEKS       50 - 500     3 WEEKS       100 - 10,000     4 WEEKS     1,000 - 30,000        FEMALE AND NON-PREGNANT FEMALE:     LESS THAN 5 mIU/mL   Troponin I (High Sensitivity)     Status: None   Collection Time: 08/20/22  9:35 PM  Result Value Ref Range   Troponin I (High Sensitivity) <2 <18 ng/L    Comment: (NOTE) Elevated high sensitivity troponin I (hsTnI) values and significant  changes across serial measurements may suggest ACS but many other  chronic and acute conditions are known to elevate hsTnI results.  Refer to the "Links" section for chest pain algorithms and additional  guidance. Performed at [facility], [address], [place], [place] [address]   D-dimer, quantitative     Status: None   Collection Time: 08/20/22  9:35 PM  Result Value Ref Range   D-Dimer,  Quant <0.27 0.00 - 0.50 ug/mL-FEU    Comment: (NOTE) At the manufacturer cut-off value of 0.5 g/mL FEU, this assay has a negative predictive value of 95-100%.This assay is intended for use in conjunction with a clinical pretest probability (PTP) assessment model to exclude pulmonary embolism (PE) and deep venous thrombosis (DVT) in outpatients suspected of PE or DVT. Results should be correlated with clinical presentation. Performed at [facility], [address] [place], [place] [address]     Blood Alcohol level:  Lab Results  Component Value Date   ETH <10 08/12/2022   ETH <10 12/27/2021   Metabolic Disorder Labs: Lab Results  Component Value Date   HGBA1C 5.1 08/17/2022   MPG 100 08/17/2022   MPG 103 05/19/2018   Lab Results  Component Value Date   PROLACTIN 30.2 04/12/2011   Lab Results  Component Value Date   CHOL 128 08/17/2022   TRIG 55 08/17/2022   HDL 45  08/17/2022   CHOLHDL 2.8 08/17/2022   VLDL 11 08/17/2022   LDLCALC 72 08/17/2022   LDLCALC 56 05/19/2018    Physical Findings: AIMS:  , ,  ,  ,    CIWA:    COWS:     Musculoskeletal: Strength & Muscle Tone: within normal limits Gait & Station: normal Patient leans: N/A  Psychiatric Specialty Exam:  Presentation  General Appearance:  Appropriate for Environment; Casual; Fairly Groomed  Eye Contact: Good  Speech: Normal Rate; Clear and Coherent ((Presents with a deep voice (boy-like).))  Speech Volume: Normal  Handedness: Right  Mood and Affect  Mood: -- ("Improving")  Affect: Congruent  Thought Process  Thought Processes: Coherent  Descriptions of Associations:Intact  Orientation:Full (Time, Place and Person)  Thought Content:Perseveration; Obsessions  History of Schizophrenia/Schizoaffective disorder:Yes  Duration of Psychotic Symptoms:Greater than six months  Hallucinations:No data recorded  Ideas of Reference:None  Suicidal Thoughts:No data recorded  Homicidal Thoughts:No data recorded  Sensorium  Memory: Immediate Good; Recent Good; Remote Good  Judgment: Fair  Insight: Fair   Chartered certified accountant: Fair  Attention Span: Fair  Recall: Fair  Fund of Knowledge: Fair; Good  Language: Good  Psychomotor Activity  Psychomotor Activity: No data recorded  Assets  Assets: Communication Skills; Desire for Improvement; Financial Resources/Insurance; Housing; Physical Health; Resilience; Social Support  Sleep  Sleep: No data recorded  Physical Exam: Physical Exam Vitals and nursing note reviewed.  HENT:     Nose: Nose normal.     Mouth/Throat:     Pharynx: Oropharynx is clear.  Eyes:     Pupils: Pupils are equal, round, and reactive to light.  Cardiovascular:     Rate and Rhythm: Normal rate.     Comments: Hx. Cardiac issues.  Pulse rate: Slightly elevated. Pulmonary:     Effort: Pulmonary effort  is normal.  Genitourinary:    Comments: Deferred Musculoskeletal:        General: Normal range of motion.     Cervical back: Normal range of motion.  Skin:    General: Skin is warm and dry.  Neurological:     General: No focal deficit present.     Mental Status: She is alert and oriented to person, place, and time.    Review of Systems  Constitutional:  Negative for chills, diaphoresis and fever.  HENT:  Negative for congestion and sore throat.   Respiratory:  Negative for cough, shortness of breath and wheezing.   Cardiovascular:  Negative for chest pain and palpitations.  Gastrointestinal:  Negative for abdominal pain, constipation, diarrhea, heartburn, nausea and vomiting.  Musculoskeletal:  Negative for joint pain and myalgias.  Neurological:  Negative for dizziness, tingling, tremors, sensory change, speech change, focal weakness, seizures, loss of consciousness, weakness and headaches.  Endo/Heme/Allergies:        See allergy lists.  Psychiatric/Behavioral:  Positive for depression and hallucinations. Negative for memory loss, substance abuse and suicidal ideas. The patient is nervous/anxious. The patient does not have insomnia.    Blood pressure 114/80, pulse (!) 108, temperature 97.8 F (36.6 C), temperature source Oral, resp. rate 16, height 5\' 5"  (1.651 m), weight 67.8 kg, SpO2 99 %. Body mass index is 24.88 kg/m.  Treatment Plan Summary: Daily contact with patient to assess and evaluate symptoms and progress in treatment and Medication management.   Continue inpatient hospitalization.  Will continue today 08/21/2022 plan as below except where it is noted.   Diagnoses. Severe recurrent major depressive disorder with psychotic features ([place])  Plan: Continue Seroquel 400 mg po Q bedtime for psychosis. Continue Seroquel 50 mg po bid for agitation/psychosis. Continue Hydroxyzine 25 mg po tid prn for anxiety.  Continue Effexor XR 75 mg po daily for depression.   Other  medical issues.  Discontinued Propranolol 10 mg po bid for elevated heart rate. Continue albuterol inhaler 1-2 puffs Q 4 hrs prn for sob. Continue Dulera 200--5 MCG 2 puffs bid for asthma. Continue Verapamil 40 mg po daily for HTN.  Continue Verapamil 120 mg po Q hs for HTN.  Continue Vitam D3 4.000 units po daily for bone health.  Continue loratadine 10 mg po daily for allergies.  Clonidine 0.1 mg po once today (completed).   Other PRNS -Continue Tylenol 650 mg every 6 hours PRN for mild pain -Continue Maalox 30 ml Q 4 hrs PRN for indigestion -Continue MOM 30 ml po Q 6 hrs for constipation.  -Continue Zofran-odt 4 mg po Q 8 hrs for N/V.  -Continue clonidine 0.1 mg Q 4 hrs prn for systolic b/p [id number] or diastolic b/p [id number].   Safety and Monitoring: Voluntary admission to inpatient psychiatric unit for safety, stabilization and treatment Daily contact with patient to assess and evaluate symptoms and progress in treatment Patient's case to be discussed in multi-disciplinary team meeting Observation Level : q15 minute checks Vital signs: q12 hours Precautions: Safety   Discharge Planning: Social work and case management to assist with discharge planning and identification of hospital follow-up needs prior to discharge Estimated LOS: 5-7 days Discharge Concerns: Need to establish a safety plan; Medication compliance and effectiveness Discharge Goals: Return home with outpatient referrals for mental health follow-up including medication management/psychotherapy   Lindell Spar, NP, pmhnp, 08/21/2022, 10:12 AMPatient ID: Racheal Patches, female   DOB: August 25, 1996, 26 y.o.   MRN: [id number] Patient ID: MADELINN SPINLER, female   DOB: December 01, 1995, 26 y.o.   MRN: [id number] Patient ID: KELANI THUNBERG, female   DOB: Mar 02, 1996, 26 y.o.   MRN: [id number]

## 2022-08-21 NOTE — Progress Notes (Signed)
Pt rates depression 0/10 and anxiety 0/10. Pt reports a fair appetite, and no physical problems. Pt states she is still hearing voices states she heard voice call her name but voices are not telling her to do  anything denies SI/HI/VH and verbally contracts for safety. Provided support and encouragement. Pt safe on the unit. Q 15 minute safety checks continued.

## 2022-08-21 NOTE — Progress Notes (Signed)
Patient returned from ED

## 2022-08-21 NOTE — Progress Notes (Signed)
Patient would like order for cough drop.

## 2022-08-21 NOTE — BHH Counselor (Signed)
CSW spoke with [facility] house Manager "Ms. B" concerning pt's ability to return to the Group home residence, post discharge and was informed that the pt will be allowed to return to the group home after discharge. Resident manager expressed concern about the pt requiring more supervision and states that she plans to discuss her concerns with the facility's Behaviorist. Notified provider, (MD). Will continue to monitor and provide support as needed.

## 2022-08-21 NOTE — ED Notes (Signed)
Patient will be returning to [facility] she is not complaining of chest pain. Safe transport has been called.

## 2022-08-21 NOTE — BHH Group Notes (Signed)
Patient attended goals group.  

## 2022-08-21 NOTE — Progress Notes (Signed)
D:  Patient's self inventory sheet, patient sleeps good, no sleep medication.  Good appetite, normal energy level, good concentration.  Rated depression 5,  hopeless 2, denied anxiety.  Denied withdrawals.  SI off/on, contracts for safety.  Denied HI.  Stated she does see birds and hears mumblings.   Physical problems, unable to talk.  Denied physical pain.  Goal is stay focused.  Plans to stay busy.  "Unable to speak, will write down information you need." A:  Medications administered per MD orders.  Emotional support and encouragement given patient. R:  Denied HI.  SI off/on, contracts for safety.  Does see birds.  Does hear mumblings.  Safety maintained with 15 minute checks.

## 2022-08-22 DIAGNOSIS — F840 Autistic disorder: Secondary | ICD-10-CM

## 2022-08-22 DIAGNOSIS — F333 Major depressive disorder, recurrent, severe with psychotic symptoms: Secondary | ICD-10-CM | POA: Diagnosis not present

## 2022-08-22 MED ORDER — QUETIAPINE FUMARATE 400 MG PO TABS: 400.0000 mg | ORAL_TABLET | Freq: Every day | ORAL | 0 refills | Status: AC

## 2022-08-22 MED ORDER — QUETIAPINE FUMARATE 50 MG PO TABS: 50.0000 mg | ORAL_TABLET | Freq: Two times a day (BID) | ORAL | 0 refills | Status: AC

## 2022-08-22 MED ORDER — VENLAFAXINE HCL ER 75 MG PO CP24: 75.0000 mg | ORAL_CAPSULE | Freq: Every day | ORAL | 0 refills | Status: AC

## 2022-08-22 NOTE — Progress Notes (Signed)
  [facility] Adult Case Management Discharge Plan :  Will you be returning to the same living situation after discharge:  Yes,  [facility]  At discharge, do you have transportation home?: Yes,  [facility]ch[facility] [phone]     Do you have the ability to pay for your medications: Yes,  insured  Release of information consent forms completed and in the chart;  Patient's signature needed at discharge.  Patient to Follow up at:  Follow-up Information     [facility]. Go on 09/06/2022.   Why: You have a hospital follow up appointment for group therapy services on 09/06/22 at 1:00 pm.   This appointment will be held in person.  Medication management services are also available. Contact information: [address] [place] [address] [phone]         Lucretia Field.   Specialty: Family Medicine Contact information: [address] [place] [address] [phone]                 Next level of care provider has access to [facility] Link:yes  Safety Planning and Suicide Prevention discussed: Yes,  [phone]    [facility] manager     Has patient been referred to the Quitline?: N/A patient is not a smoker  Patient has been referred for addiction treatment: N/A Patient to continue working towards treatment goals after discharge. Patient no longer meets criteria for inpatient criteria per attending physician. Continue taking medications as prescribed, nursing to provide instructions at discharge. Follow up with all scheduled appointments.   Melvern Ramone S Manuelito Poage, LCSW 08/22/2022, 10:46 AM

## 2022-08-22 NOTE — Discharge Summary (Signed)
Physician Discharge Summary Note  Patient:  Brittany Palmer is an 26 y.o., female  MRN:  [id number]  DOB:  1995/12/11  Patient phone:  [phone] (home)   Patient address:   [address] [place] [address],   Total Time spent with patient:  Greater than 30 minutes  Date of Admission:  08/16/2022  Date of Discharge: 08-22-22  Reason for Admission: Suicidal ideations with self-mutilating behaviors.   Principal Problem: Severe recurrent major depressive disorder with psychotic features [facility]) Discharge Diagnoses: Principal Problem:   Severe recurrent major depressive disorder with psychotic features (HCC) Active Problems:   Autism spectrum disorder   HTN (hypertension)  Past Psychiatric History: Major depressive disorder, with psychosis.  Past Medical History:  Past Medical History:  Diagnosis Date   Asthma    Autism    GERD (gastroesophageal reflux disease)    Hypertension     Past Surgical History:  Procedure Laterality Date   CYSTECTOMY     DENTAL SURGERY     TONSILLECTOMY     Family History: History reviewed. No pertinent family history.  Family Psychiatric  History: See H&P.  Social History:  Social History   Substance and Sexual Activity  Alcohol Use No     Social History   Substance and Sexual Activity  Drug Use No    Social History   Socioeconomic History   Marital status: Single    Spouse name: Not on file   Number of children: Not on file   Years of education: Not on file   Highest education level: Not on file  Occupational History   Not on file  Tobacco Use   Smoking status: Never   Smokeless tobacco: Never  Vaping Use   Vaping Use: Never used  Substance and Sexual Activity   Alcohol use: No   Drug use: No   Sexual activity: Never  Other Topics Concern   Not on file  Social History Narrative   Not on file   Social Determinants of Health   Financial Resource Strain: Not on file  Food Insecurity: Not on file   Transportation Needs: Not on file  Physical Activity: Not on file  Stress: Not on file  Social Connections: Not on file   Hospital Course: (See admission evaluation notes): This is one of several psychiatric admission assessments in this [facility]other [place] systems for this 26 year old Caucasian female. Patient is known in this [facility] from her previous admissions for mood stabilization treatments. She does have hx of autism spectrum & currently resides in a group home setting. Prior to this admission, she was receiving mental health care on outpatient basis at the [facility] in [place], [place]. She is admitted to the [facility] this time around with complain of worsening symptoms of depression & self-mutilating behaviors. She was brought to the hospital for evaluation & mood stabilization treatments.   Upon the decision to discharge Brittany Palmer today, she was seen & evaluated  by her treatment team for mental health stability. The current laboratory findings were reviewed. The nurses notes & vital signs were reviewed as well. All are stable. At this present time, there are no current mental health or medical issues that should prevent this discharge at this time. Patient is being discharged to continue mental health care & medication management as noted below.   This is one of several psychiatric admission/discharge summaries for this 26 year old Caucasian female with hx of chronic mental illness & multiple psychiatric admissions. She is  known in this [facility] & other psychiatric hospitals within the surrounding areas for worsening symptoms that included suicidal ideations & self-mutilating behaviors. She currently resides in a group home. She was brought to the [facility] this time around for evaluation & treatment after she was noted self-inflicted cuts to her abdomen, wrists & thighs.  After evaluation of her presenting symptoms, Brittany Palmer was recommended for mood stabilization treatments. The medication regimen for her presenting symptoms  were discussed & with her consent initiated. She received, stabilized & was discharged on the medications as listed below on her discharge medication lists. She was also enrolled & participated in the group counseling sessions being offered & held on this unit. She learned coping skills. She presented on this admission, other chronic medical conditions that required treatment & monitoring. She was resumed/discharged on all her pertinent home medications for those health issues. She tolerated her treatment regimen without any adverse effects or reactions reported. She was once transferred to the ED during her hospitalization here at [facility] for evaluation & treatments of chest pain. She was returned back to [facility] after medical stabilization at the ED. After few days of her hospital stay, patient developed what appeared like selective mutism whereby she was unable to speak for 2 days. She corresponded with staff by whispering, hand gestures & writing out her thoughts on a piece of paper provided to her by staff. The mutism cleared right before discharge.  Brittany Palmer symptoms responded well to her treatment regimen warranting this discharge. She is currently mentally & medically stable & agreeable to this discharge. During the course of this hospitalization, the 15-minute checks were adequate to ensure patient's safety.  Patient did not display any dangerous violent or suicidal behavior on the unit. She interacted with the other patients & staff appropriately, participated appropriately in the group sessions/therapies. Her medications were addressed & adjusted to meet her needs. She was recommended for outpatient follow-up care & medication management upon discharge to assure continuity of care & mood stability.  At the time of discharge patient is not reporting any acute suicidal/homicidal ideations. She currently denies any new issues or concerns. Education and supportive counseling provided throughout his hospital stay &  upon discharge.  Today upon her discharge evaluation with his treatment team, Brittany Palmer shares she is doing well. She denies any other specific concerns. She is sleeping well. Her appetite is good. She denies other physical complaints. She denies AH/VH. She feels that her medications have been helpful & is in agreement to continue her current treatment regimen. She was able to engage in safety planning including plan to return to [facility] or contact emergency services if she feels unable to maintain her own safety or the safety of others. Pt had no further questions, comments, or concerns. She left [facility] with all personal belongings in no apparent distress. Transportation per her group home staff.    Physical Findings: AIMS:  , ,  ,  ,    CIWA:    COWS:     Musculoskeletal: Strength & Muscle Tone: within normal limits Gait & Station: normal Patient leans: N/A  Psychiatric Specialty Exam:  Presentation  General Appearance:  Appropriate for Environment; Casual; Fairly Groomed  Eye Contact: Good  Speech: Clear and Coherent; Normal Rate  Speech Volume: Normal  Handedness: Right  Mood and Affect  Mood: Euthymic  Affect: Appropriate; Congruent  Thought Process  Thought Processes: Coherent; Goal Directed; Linear  Descriptions of Associations:Intact  Orientation:Full (Time, Place and Person)  Thought Content:Logical  History of Schizophrenia/Schizoaffective disorder:Yes  Duration of Psychotic Symptoms:Greater than six months  Hallucinations:Hallucinations: None Description of Auditory Hallucinations: NA  Ideas of Reference:None  Suicidal Thoughts:Suicidal Thoughts: No SI Active Intent and/or Plan: Without Intent; Without Plan; Without Means to Carry Out; Without Access to Means SI Passive Intent and/or Plan: Without Intent; Without Plan; Without Means to Carry Out; Without Access to Means  Homicidal Thoughts:Homicidal Thoughts: No  Sensorium  Memory: Immediate Good;  Recent Good; Remote Good  Judgment: Good  Insight: Good  Executive Functions  Concentration: Good  Attention Span: Good  Recall: Good  Fund of Knowledge: Good  Language: Good  Psychomotor Activity  Psychomotor Activity:Psychomotor Activity: Normal  Assets  Assets: Communication Skills; Desire for Improvement; Financial Resources/Insurance; Housing; Resilience; Social Support; Transportation  Sleep  Sleep:Sleep: Good Number of Hours of Sleep: 6   Physical Exam: Physical Exam Vitals and nursing note reviewed.  HENT:     Nose: Nose normal.     Mouth/Throat:     Pharynx: Oropharynx is clear.  Cardiovascular:     Rate and Rhythm: Normal rate.     Pulses: Normal pulses.     Comments: Hx. HTN  Elevated pulse rate: 105.  Patient is currently in no apparent distress. Pulmonary:     Effort: Pulmonary effort is normal.  Genitourinary:    Comments: Deferred Musculoskeletal:        General: Normal range of motion.     Cervical back: Normal range of motion.  Skin:    General: Skin is warm and dry.  Neurological:     General: No focal deficit present.     Mental Status: She is alert and oriented to person, place, and time. Mental status is at baseline.    Review of Systems  Constitutional:  Negative for chills, diaphoresis and fever.  HENT:  Negative for congestion and sore throat.   Eyes:  Negative for blurred vision.  Respiratory:  Negative for cough, shortness of breath and wheezing.        Hx. Asthma (stable on medication).  Cardiovascular:  Negative for chest pain and palpitations.       Hx. HTN  Elevated pulse rate: 105.  Patient is currently in no apparent distress.   Gastrointestinal:  Negative for abdominal pain, constipation, diarrhea, heartburn, nausea and vomiting.  Genitourinary:  Negative for dysuria.  Musculoskeletal:  Negative for joint pain and myalgias.  Neurological:  Negative for dizziness, tingling, tremors, sensory change, speech  change, focal weakness, seizures, loss of consciousness, weakness and headaches.  Endo/Heme/Allergies:        Allergies: See lists.  Psychiatric/Behavioral:  Positive for depression (Hx of (stable on medication).) and hallucinations (Hx of (stable on medication).). Negative for memory loss, substance abuse and suicidal ideas. The patient is not nervous/anxious (Hx of (stanble upon discharge).) and does not have insomnia.    Blood pressure 117/78, pulse (!) 105, temperature 97.9 F (36.6 C), temperature source Oral, resp. rate 16, height 5\' 5"  (1.651 m), weight 67.8 kg, SpO2 98 %. Body mass index is 24.88 kg/m.   Social History   Tobacco Use  Smoking Status Never  Smokeless Tobacco Never   Tobacco Cessation:  N/A, patient does not currently use tobacco products  Blood Alcohol level:  Lab Results  Component Value Date   ETH <10 08/12/2022   ETH <10 12/27/2021   Metabolic Disorder Labs:  Lab Results  Component Value Date   HGBA1C 5.1 08/17/2022   MPG 100 08/17/2022   MPG 103  05/19/2018   Lab Results  Component Value Date   PROLACTIN 30.2 04/12/2011   Lab Results  Component Value Date   CHOL 128 08/17/2022   TRIG 55 08/17/2022   HDL 45 08/17/2022   CHOLHDL 2.8 08/17/2022   VLDL 11 08/17/2022   LDLCALC 72 08/17/2022   LDLCALC 56 05/19/2018   See Psychiatric Specialty Exam and Suicide Risk Assessment completed by Attending Physician prior to discharge.  Discharge destination:  Home  Is patient on multiple antipsychotic therapies at discharge:  No   Has Patient had three or more failed trials of antipsychotic monotherapy by history:  No  Recommended Plan for Multiple Antipsychotic Therapies: NA  Allergies as of 08/22/2022       Reactions   Fluoxetine Rash, Other (See Comments)   Patient reports allergy and rash across neck when trying this medication   Inderal La [propranolol Hcl] Rash   Inderal [propranolol] Rash   Lisinopril Cough   Risperidone Other (See  Comments)   Twitching of mouth        Medication List     STOP taking these medications    ARIPiprazole 15 MG tablet Commonly known as: ABILIFY   lamoTRIgine 25 MG tablet Commonly known as: LAMICTAL   mometasone-formoterol 100-5 MCG/ACT Aero Commonly known as: DULERA       TAKE these medications      Indication  acetaminophen 160 MG/5ML suspension Commonly known as: TYLENOL Take 640 mg by mouth every 4 (four) hours as needed for mild pain or fever.  Indication: Pain   albuterol 108 (90 Base) MCG/ACT inhaler Commonly known as: VENTOLIN HFA Inhale 2 puffs into the lungs every 6 (six) hours as needed for wheezing or shortness of breath (or coughing).  Indication: Asthma   aluminum-magnesium hydroxide 200-200 MG/5ML suspension Take 15-30 mLs by mouth every 2 (two) hours as needed for indigestion (15 ml's if under 100 pounds and 30 ml's if over 100 pounds).  Indication: Heartburn   bisacodyl 10 MG suppository Commonly known as: DULCOLAX Place 10 mg rectally daily as needed for mild constipation (if no B/M in 72 hours).  Indication: Constipation   budesonide-formoterol 160-4.5 MCG/ACT inhaler Commonly known as: SYMBICORT Inhale 2 puffs into the lungs 2 (two) times daily.  Indication: Asthma   diphenhydrAMINE 12.5 MG/5ML elixir Commonly known as: BENADRYL Take 25 mg by mouth every 4 (four) hours as needed for itching or allergies (or nasal congestion).  Indication: Runny Nose   guaifenesin 100 MG/5ML syrup Commonly known as: ROBITUSSIN Take 300 mg by mouth every 4 (four) hours as needed for cough.  Indication: Cough   hydrocortisone cream 1 % Apply 1 Application topically as needed for itching (or bug bites, skin irritation, rashes, chapped skin- affected areas).  Indication: Skin Inflammation   hydrOXYzine 50 MG capsule Commonly known as: VISTARIL Take 50 mg by mouth every 8 (eight) hours as needed ("for behaviors not controlled by BSP lasting longer than 5  minutes").  Indication: Feeling Anxious   loperamide 2 MG tablet Commonly known as: IMODIUM A-D Take 2-4 mg by mouth See admin instructions. Take 4 mg by mouth for initial dose of diarrhea and 2 mg after each subsequent loose stool- cannot exceed a total of 16 mg/24 hours  Indication: Diarrhea   loratadine 10 MG tablet Commonly known as: CLARITIN Take 10 mg by mouth daily.  Indication: Perennial Allergic Rhinitis   Milk of Magnesia 400 MG/5ML suspension Generic drug: magnesium hydroxide Take 15-30 mLs by mouth daily  as needed for mild constipation (15 ml's if under 100 pounds and 30 ml's if over 100 pounds- if no relief from Dulcolax suppository).  Indication: Constipation   neomycin-bacitracin-polymyxin [phone] Oint Apply 1 Application topically See admin instructions. Apply twice daily to superficial abrasions and lacerations after cleansing. Cover with a dressing.  Indication: wound care   promethazine 25 MG tablet Commonly known as: PHENERGAN Take 25 mg by mouth See admin instructions. Take 25 mg by mouth every 4-6 hours as needed for vomiting 2 or more times in four hours and call MD if symptoms persist  Indication: Nausea and Vomiting   QUEtiapine 400 MG tablet Commonly known as: SEROQUEL Take 1 tablet (400 mg total) by mouth at bedtime. What changed: Another medication with the same name was changed. Make sure you understand how and when to take each.  Indication: Major Depressive Disorder   QUEtiapine 50 MG tablet Commonly known as: SEROQUEL Take 1 tablet (50 mg total) by mouth 2 (two) times daily. What changed:  when to take this additional instructions  Indication: Major Depressive Disorder   sodium phosphate 7-19 GM/118ML Enem Place 1 enema rectally daily as needed (for constipation if no results from Dulcolax suppository and milk of magnesia).  Indication: Evacuation of Material from the Bowel   venlafaxine XR 75 MG 24 hr capsule Commonly known as:  EFFEXOR-XR Take 1 capsule (75 mg total) by mouth daily with breakfast.  Indication: Major Depressive Disorder   verapamil 40 MG tablet Commonly known as: CALAN Take 40 mg by mouth daily at 12 noon.  Indication: High Blood Pressure Disorder   verapamil 120 MG tablet Commonly known as: CALAN Take 120 mg by mouth in the morning and at bedtime.  Indication: High Blood Pressure Disorder   vitamin A & D ointment Apply 1 Application topically 3 (three) times daily as needed for dry skin (or skin irritation, rashes, or chapped skin).  Indication: wound care   Vitamin D3 50 MCG (2000 UT) capsule Take 4,000 Units by mouth daily.  Indication: Vitamin D Deficiency        Follow-up Information     [facility]. Go on 09/06/2022.   Why: You have a hospital follow up appointment for group therapy services on 09/06/22 at 1:00 pm.   This appointment will be held in person.  Medication management services are also available. Contact information: [address] [place] [address] [phone]         Lucretia Field.   Specialty: Family Medicine Contact information: [address] [place] [address] [phone]                Follow-up recommendations: Activity:  As tolerated Diet: As recommended by your primary care doctor. Keep all scheduled follow-up appointments as recommended.   Comments: Comments: Patient is recommended to follow-up care on an outpatient basis as noted above. Prescriptions sent to pt's pharmacy of choice at discharge.   Patient agreeable to plan.   Given opportunity to ask questions.   Appears to feel comfortable with discharge denies any current suicidal or homicidal thought. Patient is also instructed prior to discharge to: Take all medications as prescribed by his/her mental healthcare provider. Report any adverse effects and or reactions from the medicines to his/her outpatient provider promptly. Patient has been  instructed & cautioned: To not engage in alcohol and or illegal drug use while on prescription medicines. In the event of worsening symptoms, patient is instructed to call the  crisis hotline, 911 and or go to the nearest ED for appropriate evaluation and treatment of symptoms. To follow-up with his/her primary care provider for your other medical issues, concerns and or health care needs.  Signed: Armandina StammerAgnes Vivianne Carles, NP, pmhnp, fnp-bc 08/22/2022, 10:01 AM

## 2022-08-22 NOTE — Progress Notes (Addendum)
Discharge note: RN met with pt and Chaquinta (Group home employee) and reviewed pt's discharge instructions. Pt and Chaquinta verbalized understanding of discharge instructions and they did not have any questions. RN reviewed and provided pt and Chaquinta with a copy of SRA, AVS and Transition Record. RN returned pt's belongings to pt.Paper prescriptions were given to pt. Pt denied SI/HI/AVH and voiced no concerns. Pt was appreciative of the care pt received at [facility].

## 2022-08-22 NOTE — [facility] Suicide Risk Assessment (Signed)
Suicide Risk Assessment  Discharge Assessment    [facility] Discharge Suicide Risk Assessment   Principal Problem: Severe recurrent major depressive disorder with psychotic features [facility])  Discharge Diagnoses: Principal Problem:   Severe recurrent major depressive disorder with psychotic features (HCC) Active Problems:   Autism spectrum disorder   HTN (hypertension)  Total Time spent with patient:  Greater than 30 minutes  Musculoskeletal: Strength & Muscle Tone: within normal limits Gait & Station: normal Patient leans: N/A  Psychiatric Specialty Exam  Presentation  General Appearance:  Appropriate for Environment; Casual; Fairly Groomed  Eye Contact: Good  Speech: Clear and Coherent; Normal Rate  Speech Volume: Normal  Handedness: Right   Mood and Affect  Mood: Euthymic  Duration of Depression Symptoms: Greater than two weeks  Affect: Appropriate; Congruent   Thought Process  Thought Processes: Coherent; Goal Directed; Linear  Descriptions of Associations:Intact  Orientation:Full (Time, Place and Person)  Thought Content:Logical  History of Schizophrenia/Schizoaffective disorder:Yes  Duration of Psychotic Symptoms:Greater than six months  Hallucinations:Hallucinations: None Description of Auditory Hallucinations: NA  Ideas of Reference:None  Suicidal Thoughts:Suicidal Thoughts: No SI Active Intent and/or Plan: Without Intent; Without Plan; Without Means to Carry Out; Without Access to Means SI Passive Intent and/or Plan: Without Intent; Without Plan; Without Means to Carry Out; Without Access to Means  Homicidal Thoughts:Homicidal Thoughts: No   Sensorium  Memory: Immediate Good; Recent Good; Remote Good  Judgment: Good  Insight: Good   Executive Functions  Concentration: Good  Attention Span: Good  Recall: Good  Fund of Knowledge: Good  Language: Good  Psychomotor Activity  Psychomotor Activity: Psychomotor Activity:  Normal   Assets  Assets: Communication Skills; Desire for Improvement; Financial Resources/Insurance; Housing; Resilience; Social Support; Transportation   Sleep  Sleep: Sleep: Good Number of Hours of Sleep: 6   Physical Exam: See H&P.  Blood pressure 117/78, pulse (!) 105, temperature 97.9 F (36.6 C), temperature source Oral, resp. rate 16, height 5\' 5"  (1.651 m), weight 67.8 kg, SpO2 98 %. Body mass index is 24.88 kg/m.  Mental Status Per Nursing Assessment::   On Admission:  Suicidal ideation indicated by patient  Demographic Factors:  Adolescent or young adult, Caucasian, and Unemployed  Loss Factors: NA  Historical Factors: Prior suicide attempts and Impulsivity  Risk Reduction Factors:   Living with another person, especially a relative, Positive social support, Positive therapeutic relationship, and Positive coping skills or problem solving skills  Continued Clinical Symptoms:  Depression:   Impulsivity More than one psychiatric diagnosis Previous Psychiatric Diagnoses and Treatments  Cognitive Features That Contribute To Risk:  Closed-mindedness, Loss of executive function, Polarized thinking, and Thought constriction (tunnel vision)    Suicide Risk:  Minimal: No identifiable suicidal ideation.  Patients presenting with no risk factors but with morbid ruminations; may be classified as minimal risk based on the severity of the depressive symptoms   Follow-up Information     [facility]. Go on 09/06/2022.   Why: You have a hospital follow up appointment for group therapy services on 09/06/22 at 1:00 pm.   This appointment will be held in person.  Medication management services are also available. Contact information: [address] [place] [phone]         [phone].   Specialty: Family Medicine Contact information: [address] [place] [phone]                Plan Of  Care/Follow-up recommendations:  See  the discharge recommendation above.  Armandina Stammer, NP, pmhnp, fnp-bc. 08/22/2022, 10:20 AM

## 2022-08-22 NOTE — [facility] Counselor (Signed)
CSW spoke with guardian/mother Ms. Joshi on 19 August 2022 informing her that a secured email would be sent to her, with the intent to obtain signature for authorization  to release the patient's health information for follow-up after discharge. On 08-22-2022 CSW received verbal consent from Ms. Koos and was told to proceed with discharge. CSW spoke with supervisor Loraine Leriche who informed CSW to proceed with discharge and await final decision concerning documents. Will continue to monitor and provide support as needed.

## 2022-08-23 NOTE — Progress Notes (Signed)
Chaplain met with Brittany Palmer at her request to discuss whether her symptoms are "spiritual in nature."  She would not speak and so she wrote to me about her experience of her symptoms.  She had worked previously with a [employer]" who told her that she had 2 demons by the names of Konrad Dolores and Herbie Baltimore and she is concerned that her symptoms may be related to them.  She cannot identify two distinct voices, but feels that she no longer looks like herself in her eyes especially and that a friend commented on that before she came in to the hospital.  She is very distressed by her symptoms.  Chaplain encouraged her to be open and honest about any symptoms that she is experiencing with her provider so that they can help to find the right medications to help her.  Chaplain also provided spiritual support through listening.  [address] [place] Pager, [phone]

## 2022-09-21 ENCOUNTER — Emergency Department (HOSPITAL_COMMUNITY): Payer: Medicare Other

## 2022-09-21 ENCOUNTER — Other Ambulatory Visit: Payer: Self-pay

## 2022-09-21 ENCOUNTER — Emergency Department (HOSPITAL_COMMUNITY)
Admission: EM | Admit: 2022-09-21 | Discharge: 2022-09-22 | Disposition: A | Discharge status: Home / Self Care | Payer: Medicare Other | Attending: Emergency Medicine | Admitting: Emergency Medicine

## 2022-09-21 ENCOUNTER — Encounter (HOSPITAL_COMMUNITY): Payer: Self-pay

## 2022-09-21 DIAGNOSIS — Z7951 Long term (current) use of inhaled steroids: Secondary | ICD-10-CM | POA: Diagnosis not present

## 2022-09-21 DIAGNOSIS — J4521 Mild intermittent asthma with (acute) exacerbation: Secondary | ICD-10-CM

## 2022-09-21 DIAGNOSIS — Z1152 Encounter for screening for COVID-19: Secondary | ICD-10-CM | POA: Diagnosis not present

## 2022-09-21 DIAGNOSIS — F840 Autistic disorder: Secondary | ICD-10-CM | POA: Diagnosis not present

## 2022-09-21 DIAGNOSIS — R0602 Shortness of breath: Secondary | ICD-10-CM | POA: Diagnosis present

## 2022-09-21 DIAGNOSIS — J45901 Unspecified asthma with (acute) exacerbation: Secondary | ICD-10-CM | POA: Diagnosis not present

## 2022-09-21 LAB — CBC
HCT: 39.4 % (ref 36.0–46.0)
Hemoglobin: 13.4 g/dL (ref 12.0–15.0)
MCH: 30.2 pg (ref 26.0–34.0)
MCHC: 34 g/dL (ref 30.0–36.0)
MCV: 88.9 fL (ref 80.0–100.0)
Platelets: 237 10*3/uL (ref 150–400)
RBC: 4.43 MIL/uL (ref 3.87–5.11)
RDW: 12.2 % (ref 11.5–15.5)
WBC: 9.4 10*3/uL (ref 4.0–10.5)
nRBC: 0 % (ref 0.0–0.2)

## 2022-09-21 LAB — D-DIMER, QUANTITATIVE: D-Dimer, Quant: <0.27 ug/mL-FEU (ref 0.00–0.50)

## 2022-09-21 MED ORDER — IPRATROPIUM-ALBUTEROL 0.5-2.5 (3) MG/3ML IN SOLN
3.0000 mL | Freq: Once | RESPIRATORY_TRACT | Status: AC
  Administered 2022-09-21: 3 mL via RESPIRATORY_TRACT

## 2022-09-21 MED ORDER — IPRATROPIUM-ALBUTEROL 0.5-2.5 (3) MG/3ML IN SOLN
3.0000 mL | Freq: Once | RESPIRATORY_TRACT | Status: AC
  Administered 2022-09-21: 3 mL via RESPIRATORY_TRACT
  Filled 2022-09-21: qty 3

## 2022-09-21 MED ORDER — IPRATROPIUM-ALBUTEROL 0.5-2.5 (3) MG/3ML IN SOLN
RESPIRATORY_TRACT | Status: AC
  Filled 2022-09-21: qty 3

## 2022-09-21 NOTE — ED Provider Triage Note (Signed)
Emergency Medicine Provider Triage Evaluation Note  Brittany Palmer , a 27 y.o. female  was evaluated in triage.  Pt complains of chest pain and shortness of breath. Patient is unable to speak in triage. From reading her lips she states she is having asthma. Chest pain started around 7p today, on the center of her chest, constant, non radiating. No extremity weakness, numbness or diaphoresis.   Review of Systems  Positive: As above Negative: As above  Physical Exam  BP (!) 143/95 (BP Location: Left Arm)   Pulse (!) 129   Temp 98.1 F (36.7 C)   Resp 19   Ht 5\' 5"  (1.651 m)   Wt 68.5 kg   SpO2 98%   BMI 25.13 kg/m  Gen:   Awake, no distress   Resp:  Normal effort  MSK:   Moves extremities without difficulty  Other:  Dry mouth, unable to speak in triage. Tachycardia present.   Medical Decision Making  Medically screening exam initiated at 10:33 PM.  Appropriate orders placed.  Brittany Palmer was informed that the remainder of the evaluation will be completed by another provider, this initial triage assessment does not replace that evaluation, and the importance of remaining in the ED until their evaluation is complete.     Rex Kras, [place] 09/21/22 2253

## 2022-09-21 NOTE — ED Notes (Signed)
Patient transported to X-ray.

## 2022-09-21 NOTE — ED Triage Notes (Signed)
Patient arrives with support staff member c/o [facility] with chest pain that started around 7p this evening. Pt unable to speaks and writes down it is due "to her asthma." Pt denies cardiac hx.

## 2022-09-21 NOTE — ED Provider Notes (Signed)
Brittany Palmer EMERGENCY DEPARTMENT AT [facility] Provider Note   CSN: [id number] Arrival date & time: 09/21/22  2217     History  Chief Complaint  Patient presents with   [facility] with CP    Brittany Palmer is a 27 y.o. female.  HPI Patient is a 27 year old female with a past medical history significant for autism spectrum disorder, depression, bipolar 1, asthma        Home Medications Prior to Admission medications   Medication Sig Start Date End Date Taking? Authorizing Provider  acetaminophen (TYLENOL) 160 MG/5ML suspension Take 640 mg by mouth every 4 (four) hours as needed for mild pain or fever.    [provider]  albuterol (PROVENTIL HFA;VENTOLIN HFA) 108 (90 Base) MCG/ACT inhaler Inhale 2 puffs into the lungs every 6 (six) hours as needed for wheezing or shortness of breath (or coughing).    [provider]  aluminum-magnesium hydroxide 200-200 MG/5ML suspension Take 15-30 mLs by mouth every 2 (two) hours as needed for indigestion (15 ml's if under 100 pounds and 30 ml's if over 100 pounds).    [provider]  bisacodyl (DULCOLAX) 10 MG suppository Place 10 mg rectally daily as needed for mild constipation (if no B/M in 72 hours).    [provider]  budesonide-formoterol (SYMBICORT) 160-4.5 MCG/ACT inhaler Inhale 2 puffs into the lungs 2 (two) times daily.    [provider]  Cholecalciferol (VITAMIN D3) 50 MCG (2000 UT) capsule Take 4,000 Units by mouth daily.    [provider]  diphenhydrAMINE (BENADRYL) 12.5 MG/5ML elixir Take 25 mg by mouth every 4 (four) hours as needed for itching or allergies (or nasal congestion).    [provider]  guaifenesin (ROBITUSSIN) 100 MG/5ML syrup Take 300 mg by mouth every 4 (four) hours as needed for cough.    [provider]  hydrocortisone cream 1 % Apply 1 Application topically as needed for itching (or bug bites, skin irritation, rashes, chapped skin-  affected areas).    [provider]  hydrOXYzine (VISTARIL) 50 MG capsule Take 50 mg by mouth every 8 (eight) hours as needed ("for behaviors not controlled by BSP lasting longer than 5 minutes").    [provider]  loperamide (IMODIUM A-D) 2 MG tablet Take 2-4 mg by mouth See admin instructions. Take 4 mg by mouth for initial dose of diarrhea and 2 mg after each subsequent loose stool- cannot exceed a total of 16 mg/24 hours    [provider]  loratadine (CLARITIN) 10 MG tablet Take 10 mg by mouth daily.    [provider]  magnesium hydroxide (MILK OF MAGNESIA) 400 MG/5ML suspension Take 15-30 mLs by mouth daily as needed for mild constipation (15 ml's if under 100 pounds and 30 ml's if over 100 pounds- if no relief from Dulcolax suppository).    [provider]  neomycin-bacitracin-polymyxin [phone] OINT Apply 1 Application topically See admin instructions. Apply twice daily to superficial abrasions and lacerations after cleansing. Cover with a dressing.    [provider]  promethazine (PHENERGAN) 25 MG tablet Take 25 mg by mouth See admin instructions. Take 25 mg by mouth every 4-6 hours as needed for vomiting 2 or more times in four hours and call MD if symptoms persist    [provider]  QUEtiapine (SEROQUEL) 400 MG tablet Take 1 tablet (400 mg total) by mouth at bedtime. 08/22/22 09/21/22  Massengill, Harrold Donath, MD  QUEtiapine (SEROQUEL) 50 MG tablet  Take 1 tablet (50 mg total) by mouth 2 (two) times daily. 08/22/22 09/21/22  Massengill, Ovid Curd, MD  sodium phosphate (FLEET) 7-19 GM/118ML ENEM Place 1 enema rectally daily as needed (for constipation if no results from Dulcolax suppository and milk of magnesia).    [provider]  venlafaxine XR (EFFEXOR-XR) 75 MG 24 hr capsule Take 1 capsule (75 mg total) by mouth daily with breakfast. 08/22/22 09/21/22  Massengill, Ovid Curd, MD  verapamil (CALAN) 120 MG tablet Take 120 mg by  mouth in the morning and at bedtime.    [provider]  verapamil (CALAN) 40 MG tablet Take 40 mg by mouth daily at 12 noon.    [provider]  Vitamins A & D (VITAMIN A & D) ointment Apply 1 Application topically 3 (three) times daily as needed for dry skin (or skin irritation, rashes, or chapped skin).    [provider]      Allergies    Fluoxetine, Inderal la [propranolol hcl], Inderal [propranolol], Lisinopril, and Risperidone    Review of Systems   Review of Systems  Physical Exam Updated Vital Signs BP (!) 143/95 (BP Location: Left Arm)   Pulse (!) 129   Temp 98.1 F (36.7 C)   Resp 19   Ht 5\' 5"  (1.651 m)   Wt 68.5 kg   LMP 08/27/2022 (Approximate)   SpO2 98%   BMI 25.13 kg/m  Physical Exam Vitals and nursing note reviewed.  Constitutional:      General: She is not in acute distress.    Comments: Pt is alert and tracks examiner and nods yes and no but seems unwilling to speak.  HENT:     Head: Normocephalic and atraumatic.     Nose: Nose normal.  Eyes:     General: No scleral icterus. Cardiovascular:     Rate and Rhythm: Regular rhythm. Tachycardia present.     Pulses: Normal pulses.     Heart sounds: Normal heart sounds.     Comments: HR 120-125 reg rhythm No MRG Pulmonary:     Effort: Pulmonary effort is normal. No respiratory distress.     Breath sounds: Wheezing present.     Comments: Globally diminished lung sounds with expiratory wheezes only mild tachypnea with rate of 20 or so Abdominal:     Palpations: Abdomen is soft.     Tenderness: There is no abdominal tenderness.  Musculoskeletal:     Cervical back: Normal range of motion.     Right lower leg: No edema.     Left lower leg: No edema.  Skin:    General: Skin is warm and dry.     Capillary Refill: Capillary refill takes less than 2 seconds.  Neurological:     Mental Status: She is alert. Mental status is at baseline.  Psychiatric:        Mood and Affect: Mood  normal.        Behavior: Behavior normal.     ED Results / Procedures / Treatments   Labs (all labs ordered are listed, but only abnormal results are displayed) Labs Reviewed  BASIC METABOLIC PANEL  CBC  URINALYSIS, ROUTINE W REFLEX MICROSCOPIC  D-DIMER, QUANTITATIVE  HCG, SERUM, QUALITATIVE  TROPONIN I (HIGH SENSITIVITY)    EKG EKG Interpretation  Date/Time:  Saturday September 21 2022 22:26:01 EST Ventricular Rate:  127 PR Interval:  103 QRS Duration: 84 QT Interval:  254 QTC Calculation: 370 R Axis:   84 Text Interpretation: Sinus tachycardia RSR' in  V1 or V2, probably normal variant Borderline repolarization abnormality Since last tracing rate faster Confirmed by Isla Pence [phone]) on 09/21/2022 10:34:32 PM  Radiology DG Chest 2 View  Result Date: 09/21/2022 CLINICAL DATA:  Chest pain. EXAM: CHEST - 2 VIEW COMPARISON:  August 20, 2022 FINDINGS: The heart size and mediastinal contours are within normal limits. Both lungs are clear. The visualized skeletal structures are unremarkable. IMPRESSION: No active cardiopulmonary disease. Electronically Signed   By: Virgina Norfolk M.D.   On: 09/21/2022 22:58    Procedures Procedures    Medications Ordered in ED Medications  ipratropium-albuterol (DUONEB) 0.5-2.5 (3) MG/3ML nebulizer solution 3 mL (has no administration in time range)  ipratropium-albuterol (DUONEB) 0.5-2.5 (3) MG/3ML nebulizer solution 3 mL (3 mLs Nebulization Given 09/21/22 2316)    ED Course/ Medical Decision Making/ A&P Clinical Course as of 09/22/22 0112  Sun Sep 22, 2022  0015 Reassessed: [facility] improved. Lung sounds w wheezing now.  [WF]    Clinical Course User Index [WF] Tedd Sias, [place]                             Medical Decision Making Amount and/or Complexity of Data Reviewed Labs: ordered.  Risk Prescription drug management.   This patient presents to the ED for concern of SOB, this involves a number of treatment options,  and is a complaint that carries with it a high risk of complications and morbidity. A differential diagnosis was considered for the patient's symptoms which is discussed below:   The causes for shortness of breath include but are not limited to Cardiac (AHF, pericardial effusion and tamponade, arrhythmias, ischemia, etc) Respiratory (COPD, asthma, pneumonia, pneumothorax, primary pulmonary hypertension, PE/VQ mismatch) Hematological (anemia) Neuromuscular (ALS, Guillain-Barr, etc)    Co morbidities: Discussed in HPI   Brief History:      EMR reviewed including pt PMHx, past surgical history and past visits to ER.   See HPI for more details   Lab Tests:   I ordered and independently interpreted labs. Labs notable for Trop x 2 undetectable, hCG negative for pregnancy.  RSV COVID influenza negative.  CBC without leukocytosis or anemia BMP unremarkable D-dimer undetectably low and with low suspicion for PE   Imaging Studies:  NAD. I personally reviewed all imaging studies and no acute abnormality found. I agree with radiology interpretation.    Cardiac Monitoring:  The patient was maintained on a cardiac monitor.  I personally viewed and interpreted the cardiac monitored which showed an underlying rhythm of: Sinus tachycardia improved to normal sinus rhythm with heart rate in the 80s EKG non-ischemic   Medicines ordered:  I ordered medication including DuoNeb x 3, Solu-Medrol for shortness of breath, magnesium, lactated Ringer's Reevaluation of the patient after these medicines showed that the patient resolved I have reviewed the patients home medicines and have made adjustments as needed   Critical Interventions:     Consults/Attending Physician      Reevaluation:  After the interventions noted above I re-evaluated patient and found that they have :resolved   Social Determinants of Health:      Problem List / ED Course:  Patient with shortness  of breath lung sounds difficult to auscultate initially.  She is quite tachycardic with rates between 120-125 on my exam.  There certainly seems to be some degree of anxiety however she does have somewhat decreased lung sounds.  Given 3 DuoNebs magnesium and reassessed and patient  is breathing without much larger tidal volume does have some faint expiratory wheezing.  Overall her exam throughout has not been particularly concerning for significant asthma exacerbation I do not believe she had quite lung sounds earlier but rather was just not moving air very well.  However she seems much less anxious and her tachycardia has completely resolved after breathing treatments therefore will give Solu-Medrol and discharged with low-dose of prednisone and recommendations to follow-up with PCP.  She has had no hypoxia or significantly increased work of breathing here in the ER.  She felt comfortable with discharge home.  Will discharge home doubt PE and negative D-dimer makes very unlikely.  Strict return precautions discussed.   Dispostion:  After consideration of the diagnostic results and the patients response to treatment, I feel that the patent would benefit from close outpatient follow-up.   Vitals at time of discharge significantly improved Vitals:   09/22/22 0230 09/22/22 0249  BP: 126/78   Pulse: 81   Resp: 11   Temp:  98 F (36.7 C)  SpO2: 99%       Final Clinical Impression(s) / ED Diagnoses Final diagnoses:  Intermittent asthma with acute exacerbation, unspecified asthma severity    Rx / DC Orders ED Discharge Orders     None         Tedd Sias, [place] 26/94/85 4627    Delora Fuel, MD 03/50/09 [phone]

## 2022-09-22 LAB — RESP PANEL BY RT-PCR (RSV, FLU A&B, COVID)  RVPGX2
Influenza A by PCR: NEGATIVE
Influenza B by PCR: NEGATIVE
Resp Syncytial Virus by PCR: NEGATIVE
SARS Coronavirus 2 by RT PCR: NEGATIVE

## 2022-09-22 LAB — BASIC METABOLIC PANEL
Anion gap: 6 (ref 5–15)
BUN: 14 mg/dL (ref 6–20)
CO2: 24 mmol/L (ref 22–32)
Calcium: 8.6 mg/dL — ABNORMAL LOW (ref 8.9–10.3)
Chloride: 107 mmol/L (ref 98–111)
Creatinine, Ser: 0.89 mg/dL (ref 0.44–1.00)
GFR, Estimated: >60 mL/min (ref >60)
Glucose, Bld: 105 mg/dL — ABNORMAL HIGH (ref 70–99)
Potassium: 3.8 mmol/L (ref 3.5–5.1)
Sodium: 137 mmol/L (ref 135–145)

## 2022-09-22 LAB — TROPONIN I (HIGH SENSITIVITY)
Troponin I (High Sensitivity): <2 ng/L (ref <18)
Troponin I (High Sensitivity): <2 ng/L (ref <18)

## 2022-09-22 LAB — HCG, SERUM, QUALITATIVE: Preg, Serum: NEGATIVE

## 2022-09-22 MED ORDER — LACTATED RINGERS IV BOLUS
1000.0000 mL | Freq: Once | INTRAVENOUS | Status: AC
  Administered 2022-09-22: 1000 mL via INTRAVENOUS

## 2022-09-22 MED ORDER — PREDNISONE 10 MG PO TABS: 20.0000 mg | ORAL_TABLET | Freq: Every day | ORAL | 0 refills | Status: AC

## 2022-09-22 MED ORDER — METHYLPREDNISOLONE SODIUM SUCC 125 MG IJ SOLR
125.0000 mg | INTRAMUSCULAR | Status: AC
  Administered 2022-09-22: 125 mg via INTRAVENOUS
  Filled 2022-09-22: qty 2

## 2022-09-22 MED ORDER — MAGNESIUM SULFATE 2 GM/50ML IV SOLN
2.0000 g | Freq: Once | INTRAVENOUS | Status: AC
  Administered 2022-09-22: 2 g via INTRAVENOUS
  Filled 2022-09-22: qty 50

## 2022-09-22 NOTE — Discharge Instructions (Signed)
Please follow-up with your pulmonologist/primary care doctor.  [facility] emergency room for any new or concerning symptoms.  Hydrate, take your prescribed medications.  Use the prednisone that I prescribed which is a low-dose prednisone for the entire course.

## 2023-09-07 ENCOUNTER — Emergency Department (HOSPITAL_COMMUNITY)
Admission: EM | Admit: 2023-09-07 | Discharge: 2023-09-08 | Disposition: A | Discharge status: Home / Self Care | Payer: Medicare Other | Attending: Emergency Medicine | Admitting: Emergency Medicine

## 2023-09-07 ENCOUNTER — Encounter (HOSPITAL_COMMUNITY): Payer: Self-pay

## 2023-09-07 ENCOUNTER — Other Ambulatory Visit: Payer: Self-pay

## 2023-09-07 ENCOUNTER — Emergency Department (HOSPITAL_COMMUNITY): Payer: Medicare Other

## 2023-09-07 DIAGNOSIS — T450X2A Poisoning by antiallergic and antiemetic drugs, intentional self-harm, initial encounter: Secondary | ICD-10-CM | POA: Diagnosis not present

## 2023-09-07 DIAGNOSIS — F315 Bipolar disorder, current episode depressed, severe, with psychotic features: Secondary | ICD-10-CM | POA: Insufficient documentation

## 2023-09-07 DIAGNOSIS — T1491XA Suicide attempt, initial encounter: Secondary | ICD-10-CM

## 2023-09-07 DIAGNOSIS — Z79899 Other long term (current) drug therapy: Secondary | ICD-10-CM | POA: Insufficient documentation

## 2023-09-07 DIAGNOSIS — R109 Unspecified abdominal pain: Secondary | ICD-10-CM | POA: Insufficient documentation

## 2023-09-07 DIAGNOSIS — J45909 Unspecified asthma, uncomplicated: Secondary | ICD-10-CM | POA: Diagnosis not present

## 2023-09-07 DIAGNOSIS — R45851 Suicidal ideations: Secondary | ICD-10-CM | POA: Insufficient documentation

## 2023-09-07 DIAGNOSIS — F840 Autistic disorder: Secondary | ICD-10-CM | POA: Insufficient documentation

## 2023-09-07 DIAGNOSIS — T50902A Poisoning by unspecified drugs, medicaments and biological substances, intentional self-harm, initial encounter: Secondary | ICD-10-CM | POA: Diagnosis present

## 2023-09-07 LAB — COMPREHENSIVE METABOLIC PANEL
ALT: 41 U/L (ref 0–44)
AST: 25 U/L (ref 15–41)
Albumin: 4.2 g/dL (ref 3.5–5.0)
Alkaline Phosphatase: 68 U/L (ref 38–126)
Anion gap: 7 (ref 5–15)
BUN: 10 mg/dL (ref 6–20)
CO2: 24 mmol/L (ref 22–32)
Calcium: 8.4 mg/dL — ABNORMAL LOW (ref 8.9–10.3)
Chloride: 104 mmol/L (ref 98–111)
Creatinine, Ser: 0.77 mg/dL (ref 0.44–1.00)
GFR, Estimated: >60 mL/min (ref >60)
Glucose, Bld: 93 mg/dL (ref 70–99)
Potassium: 3.7 mmol/L (ref 3.5–5.1)
Sodium: 135 mmol/L (ref 135–145)
Total Bilirubin: 0.6 mg/dL (ref 0.0–1.2)
Total Protein: 7.2 g/dL (ref 6.5–8.1)

## 2023-09-07 LAB — CBC WITH DIFFERENTIAL/PLATELET
Abs Immature Granulocytes: 0.14 10*3/uL — ABNORMAL HIGH (ref 0.00–0.07)
Basophils Absolute: 0 10*3/uL (ref 0.0–0.1)
Basophils Relative: 0 %
Eosinophils Absolute: 0 10*3/uL (ref 0.0–0.5)
Eosinophils Relative: 0 %
HCT: 37.6 % (ref 36.0–46.0)
Hemoglobin: 13.1 g/dL (ref 12.0–15.0)
Immature Granulocytes: 1 %
Lymphocytes Relative: 3 %
Lymphs Abs: 0.7 10*3/uL (ref 0.7–4.0)
MCH: 31 pg (ref 26.0–34.0)
MCHC: 34.8 g/dL (ref 30.0–36.0)
MCV: 88.9 fL (ref 80.0–100.0)
Monocytes Absolute: 1.5 10*3/uL — ABNORMAL HIGH (ref 0.1–1.0)
Monocytes Relative: 6 %
Neutro Abs: 20.7 10*3/uL — ABNORMAL HIGH (ref 1.7–7.7)
Neutrophils Relative %: 90 %
Platelets: 270 10*3/uL (ref 150–400)
RBC: 4.23 MIL/uL (ref 3.87–5.11)
RDW: 12.1 % (ref 11.5–15.5)
WBC: 23.1 10*3/uL — ABNORMAL HIGH (ref 4.0–10.5)
nRBC: 0 % (ref 0.0–0.2)

## 2023-09-07 LAB — HCG, SERUM, QUALITATIVE: Preg, Serum: NEGATIVE

## 2023-09-07 LAB — ACETAMINOPHEN LEVEL: Acetaminophen (Tylenol), Serum: <10 ug/mL — ABNORMAL LOW (ref 10–30)

## 2023-09-07 LAB — MAGNESIUM: Magnesium: 2.2 mg/dL (ref 1.7–2.4)

## 2023-09-07 LAB — RAPID URINE DRUG SCREEN, HOSP PERFORMED
Amphetamines: NOT DETECTED
Barbiturates: NOT DETECTED
Benzodiazepines: NOT DETECTED
Cocaine: NOT DETECTED
Opiates: NOT DETECTED
Tetrahydrocannabinol: NOT DETECTED

## 2023-09-07 LAB — CBG MONITORING, ED: Glucose-Capillary: 91 mg/dL (ref 70–99)

## 2023-09-07 LAB — ETHANOL: Alcohol, Ethyl (B): <10 mg/dL (ref <10)

## 2023-09-07 LAB — SALICYLATE LEVEL: Salicylate Lvl: <7 mg/dL — ABNORMAL LOW (ref 7.0–30.0)

## 2023-09-07 MED ORDER — HYDROXYZINE HCL 25 MG PO TABS: 50.0000 mg | ORAL_TABLET | Freq: Three times a day (TID) | ORAL | Status: DC | PRN

## 2023-09-07 MED ORDER — VENLAFAXINE HCL ER 75 MG PO CP24
75.0000 mg | ORAL_CAPSULE | Freq: Every day | ORAL | Status: DC
  Administered 2023-09-08: 75 mg via ORAL
  Filled 2023-09-07: qty 1

## 2023-09-07 MED ORDER — SODIUM CHLORIDE 0.9 % IV BOLUS
1000.0000 mL | Freq: Once | INTRAVENOUS | Status: AC
  Administered 2023-09-07: 1000 mL via INTRAVENOUS

## 2023-09-07 MED ORDER — ONDANSETRON HCL 4 MG/2ML IJ SOLN
4.0000 mg | Freq: Once | INTRAMUSCULAR | Status: AC
Start: 2023-09-07 — End: 2023-09-07
  Administered 2023-09-07: 4 mg via INTRAVENOUS
  Filled 2023-09-07: qty 2

## 2023-09-07 MED ORDER — VERAPAMIL HCL 120 MG PO TABS
120.0000 mg | ORAL_TABLET | Freq: Two times a day (BID) | ORAL | Status: DC
Start: 2023-09-07 — End: 2023-09-08
  Administered 2023-09-08 (×2): 120 mg via ORAL
  Filled 2023-09-07 (×2): qty 1

## 2023-09-07 MED ORDER — VERAPAMIL HCL 40 MG PO TABS
40.0000 mg | ORAL_TABLET | Freq: Every day | ORAL | Status: DC
  Administered 2023-09-08: 40 mg via ORAL
  Filled 2023-09-07: qty 1

## 2023-09-07 MED ORDER — HYDROXYZINE PAMOATE 50 MG PO CAPS
50.0000 mg | ORAL_CAPSULE | Freq: Three times a day (TID) | ORAL | Status: DC | PRN
Start: 2023-09-07 — End: 2023-09-07

## 2023-09-07 MED ORDER — QUETIAPINE FUMARATE 100 MG PO TABS
400.0000 mg | ORAL_TABLET | Freq: Every day | ORAL | Status: DC
Start: 2023-09-08 — End: 2023-09-08

## 2023-09-07 MED ORDER — IOHEXOL 300 MG/ML  SOLN
100.0000 mL | Freq: Once | INTRAMUSCULAR | Status: AC | PRN
  Administered 2023-09-07: 100 mL via INTRAVENOUS

## 2023-09-07 NOTE — ED Notes (Addendum)
 Number for Group home [phone] Number for Burnell Blanks. [phone]

## 2023-09-07 NOTE — ED Triage Notes (Signed)
 Patient BIB EMS from group home for intentional overdose. Patient reports the voices told her to kill herself. Patient took a brand new bottle of 400 benadryl, 25 mg tablets, unsure on how much was actually swallowed. Per EMS patient had multiple episodes of emesis. Given zofran  with EMS.

## 2023-09-07 NOTE — ED Provider Notes (Signed)
 [place] EMERGENCY DEPARTMENT AT [facility] Provider Note   CSN: [id number] Arrival date & time: 09/07/23  1837     History  Chief Complaint  Patient presents with   Suicide Attempt    Brittany Palmer is a 29 y.o. female.  HPI Presents after suicide attempt, reportedly. Patient is a group home resident, psychiatric disease, cognitive disorder.  Reportedly the patient ordered a bottle of Benadryl from [employer].  Today she was found by group home staff with multiple tablets around her, and some half chewed, some on her face.  Patient cannot provide any substantial details that are useful.     Home Medications Prior to Admission medications   Medication Sig Start Date End Date Taking? Authorizing Provider  acetaminophen  (TYLENOL ) 160 MG/5ML suspension Take 640 mg by mouth every 4 (four) hours as needed for mild pain or fever.   Yes [provider]  albuterol  (PROVENTIL  HFA;VENTOLIN  HFA) 108 (90 Base) MCG/ACT inhaler Inhale 2 puffs into the lungs every 6 (six) hours as needed for wheezing or shortness of breath (or coughing).   Yes [provider]  aluminum-magnesium  hydroxide 200-200 MG/5ML suspension Take 15-30 mLs by mouth every 2 (two) hours as needed for indigestion (15 ml's if under 100 pounds and 30 ml's if over 100 pounds).   Yes [provider]  bisacodyl (DULCOLAX) 10 MG suppository Place 10 mg rectally daily as needed for mild constipation (if no B/M in 72 hours).   Yes [provider]  budesonide -formoterol  (SYMBICORT ) 160-4.5 MCG/ACT inhaler Inhale 2 puffs into the lungs 2 (two) times daily.   Yes [provider]  Cholecalciferol  (VITAMIN D3) 50 MCG (2000 UT) capsule Take 4,000 Units by mouth daily.   Yes [provider]  diphenhydrAMINE (BENADRYL) 12.5 MG/5ML elixir Take 25 mg by mouth every 4 (four) hours as needed for itching or allergies (or nasal congestion).   Yes [provider]  guaifenesin   (ROBITUSSIN) 100 MG/5ML syrup Take 300 mg by mouth every 4 (four) hours as needed for cough.   Yes [provider]  hydrocortisone cream 1 % Apply 1 Application topically as needed for itching (or bug bites, skin irritation, rashes, chapped skin- affected areas).   Yes [provider]  hydrOXYzine  (VISTARIL ) 50 MG capsule Take 50 mg by mouth every 8 (eight) hours as needed (for behaviors not controlled by BSP lasting longer than 5 minutes).   Yes [provider]  loperamide (IMODIUM A-D) 2 MG tablet Take 2-4 mg by mouth See admin instructions. Take 4 mg by mouth for initial dose of diarrhea and 2 mg after each subsequent loose stool- cannot exceed a total of 16 mg/24 hours   Yes [provider]  loratadine  (CLARITIN ) 10 MG tablet Take 10 mg by mouth daily.   Yes [provider]  magnesium  hydroxide (MILK OF MAGNESIA) 400 MG/5ML suspension Take 15-30 mLs by mouth daily as needed for mild constipation (15 ml's if under 100 pounds and 30 ml's if over 100 pounds- if no relief from Dulcolax suppository).   Yes [provider]  neomycin -bacitracin -polymyxin [phone] OINT Apply 1 Application topically See admin instructions. Apply twice daily to superficial abrasions and lacerations after cleansing. Cover with a dressing.   Yes [provider]  promethazine  (PHENERGAN ) 25 MG tablet Take 25 mg by mouth See admin instructions. Take 25 mg by mouth every 4-6 hours as needed for vomiting 2 or more times in four hours and call MD if symptoms  persist   Yes [provider]  QUEtiapine  (SEROQUEL ) 400 MG tablet Take 1 tablet (400 mg total) by mouth at bedtime. 08/22/22 09/07/23 Yes Massengill, Rankin, MD  sodium phosphate (FLEET) 7-19 GM/118ML ENEM Place 1 enema rectally daily as needed (for constipation if no results from Dulcolax suppository and milk of magnesia).   Yes [provider]  venlafaxine  XR (EFFEXOR -XR) 75 MG 24 hr capsule  Take 1 capsule (75 mg total) by mouth daily with breakfast. 08/22/22 09/07/23 Yes Massengill, Rankin, MD  verapamil  (CALAN ) 120 MG tablet Take 120 mg by mouth in the morning and at bedtime.   Yes [provider]  verapamil  (CALAN ) 40 MG tablet Take 40 mg by mouth daily at 12 noon.   Yes [provider]  QUEtiapine  (SEROQUEL ) 50 MG tablet Take 1 tablet (50 mg total) by mouth 2 (two) times daily. Patient not taking: Reported on 09/07/2023 08/22/22 09/21/22  Johny Rankin, MD      Allergies    Fluoxetine, Inderal  la [propranolol  hcl], Inderal  [propranolol ], Lisinopril, and Risperidone    Review of Systems   Review of Systems  Physical Exam Updated Vital Signs BP (!) 140/98   Pulse 100   Temp 98 F (36.7 C) (Oral)   Resp 17   Ht 5' 5 (1.651 m)   Wt 68.5 kg   SpO2 98%   BMI 25.13 kg/m  Physical Exam  ED Results / Procedures / Treatments   Labs (all labs ordered are listed, but only abnormal results are displayed) Labs Reviewed  SALICYLATE LEVEL - Abnormal; Notable for the following components:      Result Value   Salicylate Lvl <7.0 (*)    All other components within normal limits  ACETAMINOPHEN  LEVEL - Abnormal; Notable for the following components:   Acetaminophen  (Tylenol ), Serum <10 (*)    All other components within normal limits  CBC WITH DIFFERENTIAL/PLATELET - Abnormal; Notable for the following components:   WBC 23.1 (*)    Neutro Abs 20.7 (*)    Monocytes Absolute 1.5 (*)    Abs Immature Granulocytes 0.14 (*)    All other components within normal limits  COMPREHENSIVE METABOLIC PANEL - Abnormal; Notable for the following components:   Calcium 8.4 (*)    All other components within normal limits  ETHANOL  RAPID URINE DRUG SCREEN, HOSP PERFORMED  HCG, SERUM, QUALITATIVE  MAGNESIUM   CBG MONITORING, ED    EKG EKG Interpretation Date/Time:  Sunday September 07 2023 18:58:18 EST Ventricular Rate:  110 PR Interval:  164 QRS Duration:  82 QT  Interval:  352 QTC Calculation: 477 R Axis:   48  Text Interpretation: Sinus tachycardia Probable left atrial enlargement RSR' in V1 or V2, probably normal variant Borderline prolonged QT interval Confirmed by Garrick Charleston [phone]) on 09/07/2023 8:16:33 PM  Radiology CT ABDOMEN PELVIS W CONTRAST Result Date: 09/07/2023 CLINICAL DATA:  Acute abdominal pain. EXAM: CT ABDOMEN AND PELVIS WITH CONTRAST TECHNIQUE: Multidetector CT imaging of the abdomen and pelvis was performed using the standard protocol following bolus administration of intravenous contrast. RADIATION DOSE REDUCTION: This exam was performed according to the departmental dose-optimization program which includes automated exposure control, adjustment of the mA and/or kV according to patient size and/or use of iterative reconstruction technique. CONTRAST:  OMNIPAQUE  IOHEXOL  300 MG/ML  SOLN COMPARISON:  CT abdomen 03/09/2018 FINDINGS: Lower chest: There are minimal patchy ground-glass opacities in the left lower lobe and lingula. Hepatobiliary: No focal liver abnormality is seen. No gallstones, gallbladder  wall thickening, or biliary dilatation. Pancreas: Unremarkable. No pancreatic ductal dilatation or surrounding inflammatory changes. Spleen: Normal in size. There is a subcentimeter rounded hypodensity in the spleen which is too small to characterize. This is seen on prior. Adrenals/Urinary Tract: The bladder is distended, but otherwise within normal limits. Kidneys and adrenal glands are within normal limits. Stomach/Bowel: Stomach is within normal limits. Appendix appears normal. No evidence of bowel wall thickening, distention, or inflammatory changes. Vascular/Lymphatic: No significant vascular findings are present. No enlarged abdominal or pelvic lymph nodes. Reproductive: Uterus and bilateral adnexa are unremarkable. Other: There is a small fat containing umbilical hernia. No abdominopelvic ascites. Musculoskeletal: No acute or  significant osseous findings. IMPRESSION: 1. No acute localizing process in the abdomen or pelvis. 2. Distended bladder. 3. Minimal patchy ground-glass opacities in the left lower lobe and lingula, possibly infectious/inflammatory. Electronically Signed   By: Greig Pique M.D.   On: 09/07/2023 21:41    Procedures Procedures    Medications Ordered in ED Medications  sodium chloride  0.9 % bolus 1,000 mL (1,000 mLs Intravenous New Bag/Given 09/07/23 2000)  sodium chloride  0.9 % bolus 1,000 mL (1,000 mLs Intravenous New Bag/Given 09/07/23 2156)  ondansetron  (ZOFRAN ) injection 4 mg (4 mg Intravenous Given 09/07/23 2155)  iohexol  (OMNIPAQUE ) 300 MG/ML solution 100 mL (100 mLs Intravenous Contrast Given 09/07/23 2112)    ED Course/ Medical Decision Making/ A&P                                 Medical Decision Making Adult female presents from group home with concern for possible Benadryl ingestion, possible suicidal ideation.  Patient has nausea, belly pain initially.  Concern for toxic ingestion hide patient was placed on continuous monitoring, Poison control called.  Fluids started. Cardiac 110 sinus tach abnormal pulse ox 99% room air normal  Amount and/or Complexity of Data Reviewed Independent Historian: caregiver    Details: Staff from group home at bedside External Data Reviewed: notes. Labs: ordered. Decision-making details documented in ED Course. Radiology: ordered and independent interpretation performed. Decision-making details documented in ED Course. ECG/medicine tests: ordered and independent interpretation performed. Decision-making details documented in ED Course.  Risk Prescription drug management. Decision regarding hospitalization. Diagnosis or treatment significantly limited by social determinants of health.   Caregiver at bedside corroborates history as above.   Patient has had improvement clinically, no longer with vomiting, no longer with abdominal pain, heart rate  now in the 90s sinus unremarkable. EKGs have been monitored, without change, CT abdomen pelvis reviewed, without pathology including obstruction. Given passage of time, without decompensation, patient now appropriate for behavioral evaluation. In discussing all findings with the patient, though it is noted that she has some cognitive limitation, patient is agreeable with ongoing evaluation, states that she wants to get help.  Final Clinical Impression(s) / ED Diagnoses Final diagnoses:  Suicide attempt [facility])  CRITICAL CARE Performed by: Lamar Salen Total critical care time: 35 minutes Critical care time was exclusive of separately billable procedures and treating other patients. Critical care was necessary to treat or prevent imminent or life-threatening deterioration. Critical care was time spent personally by me on the following activities: development of treatment plan with patient and/or surrogate as well as nursing, discussions with consultants, evaluation of patient's response to treatment, examination of patient, obtaining history from patient or surrogate, ordering and performing treatments and interventions, ordering and review of laboratory studies, ordering and review of radiographic  studies, pulse oximetry and re-evaluation of patient's condition.    Garrick Charleston, MD 09/07/23 2308

## 2023-09-07 NOTE — ED Notes (Signed)
 [place] poison control called. Spoke with Karna RN who states recommend EKG 4 hrs post ingestion. Cardiac monitoring with 8 hr obs. Benzodiazepines for agitation if necessary. Check for active bowel sounds. CMP, Tylenol , magnesium  levels needed. Mag level needs to be at least 2 and Potassium needs to be at least 4.

## 2023-09-08 DIAGNOSIS — F315 Bipolar disorder, current episode depressed, severe, with psychotic features: Secondary | ICD-10-CM

## 2023-09-08 DIAGNOSIS — T50902A Poisoning by unspecified drugs, medicaments and biological substances, intentional self-harm, initial encounter: Secondary | ICD-10-CM | POA: Diagnosis present

## 2023-09-08 DIAGNOSIS — T450X2A Poisoning by antiallergic and antiemetic drugs, intentional self-harm, initial encounter: Secondary | ICD-10-CM | POA: Diagnosis not present

## 2023-09-08 DIAGNOSIS — T1491XA Suicide attempt, initial encounter: Secondary | ICD-10-CM | POA: Diagnosis not present

## 2023-09-08 LAB — URINALYSIS, ROUTINE W REFLEX MICROSCOPIC
Bilirubin Urine: NEGATIVE
Glucose, UA: NEGATIVE mg/dL
Ketones, ur: 5 mg/dL — AB
Leukocytes,Ua: NEGATIVE
Nitrite: NEGATIVE
Protein, ur: NEGATIVE mg/dL
Specific Gravity, Urine: 1.02 (ref 1.005–1.030)
pH: 7 (ref 5.0–8.0)

## 2023-09-08 MED ORDER — METOCLOPRAMIDE HCL 5 MG/ML IJ SOLN
10.0000 mg | Freq: Once | INTRAMUSCULAR | Status: AC
  Administered 2023-09-08: 10 mg via INTRAVENOUS
  Filled 2023-09-08: qty 2

## 2023-09-08 NOTE — ED Notes (Signed)
 Sharlynn Oliphant, supervisor, came to pick up patient's home medications. Phone number is [phone].

## 2023-09-08 NOTE — ED Notes (Signed)
 [facility] called pts mother for collateral. Pts mother is aware of pt being here at the [facility] ED. Pts mother asked if pt was okay. [facility] explained that pt complained of a sore throat, her foot being numb and that she was awake and alert. Pts mother feels that pt comes to the ED for attention and has had theseissues since she was 28 years old. [facility] explained the situation with her daughter being angry with the transportation lady at the group home. Pts mother said that instead of lashing out at the person she is angry with she takes it out on herself.   Chesley Holt, [facility]  09/08/23

## 2023-09-08 NOTE — Consult Note (Signed)
 [facility] Psychiatric Consult Initial  Patient Name: .Brittany Palmer  MRN: [id number]  DOB: 08-15-1996  Consult Order details:  Orders (From admission, onward)     Start     Ordered   09/07/23 2306  CONSULT TO CALL ACT TEAM       Ordering Provider: Garrick Charleston, MD  Provider:  (Not yet assigned)  Question:  Reason for Consult?  Answer:  ingestion, suicide attempt?   09/07/23 2306             Mode of Visit: In person    Psychiatry Consult Evaluation  Service Date: September 08, 2023 LOS:  LOS: 0 days  Chief Complaint Depression, OD on Benadryl  Primary Psychiatric Diagnoses  Bipolar 1 disorder, depressed with Psychotic features 2.  Suicide attempt    Assessment  Brittany Palmer is a 28 y.o. female admitted: Presented to the EDfor 09/07/2023  6:39 PM for Suicide attempt by OD.Brittany Palmer She carries the psychiatric diagnoses-  Bipolar disorder, depressed, Recurrent MDD with Psychosis, Schizophrenic disorder, unspecified type, Autism spectrum disorder, high functioningand has a past medical history of  Asthma.   Her current presentation of suicide attempt  is most consistent with depression . She meets criteria for inpatient Psychiatry hospitalization based on high risk related to multiple Suicide attempts and self harm behavior.  Current outpatient psychotropic medications include Seroquel , Hydroxyzine , Venlafaxine  and historically she has had a positive  response to these medications. She was  compliant with medications prior to admission as evidenced by reports from group home staff.. On initial examination, patient is seen calm and cooperative. Please see plan below for detailed recommendations.   Diagnoses:  Active Hospital problems: Principal Problem:   Bipolar I disorder, current or most recent episode depressed, with psychotic features (HCC) Active Problems:   Suicide attempt by drug overdose (HCC)    Plan   ## Psychiatric Medication Recommendations:  Seroquel  400 mg po at  bed time for mood and sleep, Hydroxyzine  50 mg every 8 hours as needed for anxiety.  Venlafaxine  75 mg po daily for depression  ## Medical Decision Making Capacity: Patient has a guardian and has thus been adjudicated incompetent; please involve patients guardian in medical decision making  ## Further Work-up:  --  TSH, B12, folate -- most recent EKG on 09/07/2023 had QtC of 482 -- Pertinent labwork reviewed earlier this admission includes: CMP, UDS, CBC, UA and alcohol level   ## Disposition:-- We recommend inpatient psychiatric hospitalization when medically cleared. Patient is under voluntary admission status at this time; please IVC if attempts to leave hospital.  ## Behavioral / Environmental: -To minimize splitting of staff, assign one staff person to communicate all information from the team when feasible. or Utilize compassion and acknowledge the patient's experiences while setting clear and realistic expectations for care.    ## Safety and Observation Level:  - Based on my clinical evaluation, I estimate the patient to be at low risk of self harm in the current setting. - At this time, we recommend  routine. This decision is based on my review of the chart including patient's history and current presentation, interview of the patient, mental status examination, and consideration of suicide risk including evaluating suicidal ideation, plan, intent, suicidal or self-harm behaviors, risk factors, and protective factors. This judgment is based on our ability to directly address suicide risk, implement suicide prevention strategies, and develop a safety plan while the patient is in the clinical setting. Please contact our team if there  is a concern that risk level has changed.  CSSR Risk Category:Brittany-SSRS RISK CATEGORY: High Risk  Suicide Risk Assessment: Patient has following modifiable risk factors for suicide: active suicidal ideation, untreated depression, and under treated depression ,  which we are addressing by inpatient Psychiatry hospitalization . Patient has following non-modifiable or demographic risk factors for suicide: history of suicide attempt, history of self harm behavior, and psychiatric hospitalization Patient has the following protective factors against suicide: Access to outpatient mental health care and Supportive family  Thank you for this consult request. Recommendations have been communicated to the primary team.  We will admit to Psychiatry unit and resume her home Medications.  .   Brittany Palmer Brittany Brenae Lasecki, NP-PMHNP-BC       History of Present Illness  Relevant Aspects of Hospital ED Course:  Admitted on 09/07/2023 for Depression, anxiety, Suicide attempt by OD on Benadryl. .  Female, 28 years old with hx of Bipolar disorder, depressed, Recurrent MDD with Psychosis, Schizophrenic disorder, unspecified type, Autism spectrum disorder, high functioning brought in by Ambulance yesterday evening after she reported taking 400 tablets of Benadryl 25 mg each yesterday afternoon. Patient was seen awake, alert and oriented 4 and engaged in meaningful conversation.  Patient was calm throughout this encounter.  She admitted taking the Benadryl and the intention was to kill  herself and her stressors are due to the transportation staff that takes her to her daily Vocational place does not pay attention to her.  She added the lady driver ignores her, takes care of other people than her.  She states the driver is always ghosting her.  She also states her former roommate passed away in 2024/07/01 that hurts her much . She rates Depression 8/10 with 10 being severe depression.  She is hearing muffling voices but cannot  make out what the voices are saying.  She is seing things especially birds and Ghost.  She reports her sleep and appetite varies from day to day.She remains suicidal today with no plan.  Patient also states her mother is planning to move her out of the group home to a new  one.  Patient was hospitalized last years in April at [facility]  She has been hospitalized at [facility] in the past as well.  Patient utilizes ER and [facility] frequently for care.  Patient Psychiatry hospitalization.  We will resume her home Medications while we wait for bed availability.  She denies HI and no mention of paranoia.  Psych ROS:  Depression: yes Anxiety:  yes Mania (lifetime and current): no Psychosis: (lifetime and current): yes  Collateral information:  Contacted Tabitha Teal at 1130 am  on 09/08/2023-Nurse at The group where patient is staying.  She reports that patient always does things to get attention-OD or cut her wrist or do any surprising thing.  She reports that patient always believes that one staff or another is not paying attention to her.  Ginger reports that patient apparently took the OD to be able to come to [place] because her best Friend is in [facility].  Ginger says they will take her back after treatment.  Review of Systems  Constitutional: Negative.   HENT: Negative.    Eyes: Negative.   Respiratory: Negative.    Cardiovascular: Negative.   Gastrointestinal: Negative.   Genitourinary: Negative.   Musculoskeletal: Negative.   Skin: Negative.   Neurological: Negative.   Endo/Heme/Allergies: Negative.   Psychiatric/Behavioral:  Positive for depression, hallucinations and suicidal ideas.  Psychiatric and Social History  Psychiatric History:  Information collected from patient  Prev Dx/Sx: Bipolar disorder, depressed, Recurrent MDD with Psychosis, Schizophrenic disorder, unspecified type, Autism spectrum disorder,  Current Psych Provider: Randine more Home Meds (current): Seroquel , Hydroxyzine , Effexor ,  Previous Med Trials: Aripiprazole , Lamictal , Melatonin Therapy: Vocational therapy 5 days a week  [facility] Hospitalization: [facility], [facility], [facility]  Prior Self Harm: yes Prior Violence: na  Family Psych History:  SISTER Family Hx suicide: Sister completed suicide  Social History:  Developmental Hx: normal- Educational Hx: HS Diploma Occupational Hx: none Legal Hx: denies Living Situation: Group home Spiritual Hx: denies Access to weapons/lethal means: na   Substance History Alcohol: no  Type of alcohol na Last Drink na Number of drinks per day na History of alcohol withdrawal seizures na History of DT's na Tobacco: na Illicit drugs: na Prescription drug abuse: denies Rehab hx: denies  Exam Findings  Physical Exam:  Vital Signs:  Temp:  [98 F (36.7 Brittany)-98.8 F (37.1 Brittany)] 98.8 F (37.1 Brittany) (01/06 0630) Pulse Rate:  [96-116] 96 (01/06 0615) Resp:  [17-28] 17 (01/06 0615) BP: (128-147)/(92-113) 138/95 (01/06 1000) SpO2:  [97 %-100 %] 97 % (01/06 0615) Weight:  [68.5 kg] 68.5 kg (01/05 1850) Blood pressure (!) 138/95, pulse 96, temperature 98.8 F (37.1 Brittany), resp. rate 17, height 5' 5 (1.651 m), weight 68.5 kg, SpO2 97%. Body mass index is 25.13 kg/m.  Physical Exam Nursing note reviewed.  Constitutional:      Appearance: Normal appearance.  HENT:     Nose: Nose normal.  Eyes:     General:        Right eye: Discharge present.  Pulmonary:     Effort: Pulmonary effort is normal.  Musculoskeletal:     Comments: Bilateral legs weak, unable to walk she says but able to stand   Skin:    General: Skin is dry.  Neurological:     Mental Status: She is alert and oriented to person, place, and time.  Psychiatric:        Attention and Perception: Attention normal. She perceives auditory hallucinations.        Mood and Affect: Mood is anxious and depressed.        Behavior: Behavior normal. Behavior is cooperative.        Thought Content: Thought content includes suicidal ideation.        Judgment: Judgment is impulsive and inappropriate.     Mental Status Exam: General Appearance: Casual and Fairly Groomed  Orientation:  Full (Time, Place, and Person)  Memory:  Immediate;    Good Recent;   Good Remote;   Good  Concentration:  Concentration: Good and Attention Span: Good  Recall:  Good  Attention  Good  Eye Contact:  Good  Speech:  Clear and Coherent and Normal Rate  Language:  Good  Volume:  Normal  Mood: Depressed, sad, empty  Affect:  Congruent and Depressed  Thought Process:  Coherent  Thought Content:  Hallucinations: Auditory Visual  Suicidal Thoughts:  Yes.  without intent/plan  Homicidal Thoughts:  No  Judgement:  Impaired  Insight:  Fair  Psychomotor Activity:  Normal  Akathisia:  NA  Fund of Knowledge:  Good      Assets:  Communication Skills Desire for Improvement Housing Physical Health Social Support  Cognition:  WNL  ADL's:  Intact  AIMS (if indicated):        Other History   These have been pulled in through the  EMR, reviewed, and updated if appropriate.  Family History:  The patient's family history is not on file.  Medical History: Past Medical History:  Diagnosis Date  . Asthma   . Autism   . GERD (gastroesophageal reflux disease)   . Hypertension     Surgical History: Past Surgical History:  Procedure Laterality Date  . CYSTECTOMY    . DENTAL SURGERY    . TONSILLECTOMY       Medications:   Current Facility-Administered Medications:  .  hydrOXYzine  (ATARAX ) tablet 50 mg, 50 mg, Oral, Q8H PRN, Garrick Charleston, MD .  QUEtiapine  (SEROQUEL ) tablet 400 mg, 400 mg, Oral, QHS, Garrick Charleston, MD .  venlafaxine  XR (EFFEXOR -XR) 24 hr capsule 75 mg, 75 mg, Oral, Q breakfast, Garrick Charleston, MD, 75 mg at 09/08/23 1000 .  verapamil  (CALAN ) tablet 120 mg, 120 mg, Oral, Q12H, Garrick Charleston, MD, 120 mg at 09/08/23 1000 .  verapamil  (CALAN ) tablet 40 mg, 40 mg, Oral, Q1500, Garrick Charleston, MD  Current Outpatient Medications:  .  acetaminophen  (TYLENOL ) 160 MG/5ML suspension, Take 640 mg by mouth every 4 (four) hours as needed for mild pain or fever., Disp: , Rfl:  .  albuterol  (PROVENTIL  HFA;VENTOLIN  HFA) 108  (90 Base) MCG/ACT inhaler, Inhale 2 puffs into the lungs every 6 (six) hours as needed for wheezing or shortness of breath (or coughing)., Disp: , Rfl:  .  aluminum-magnesium  hydroxide 200-200 MG/5ML suspension, Take 15-30 mLs by mouth every 2 (two) hours as needed for indigestion (15 ml's if under 100 pounds and 30 ml's if over 100 pounds)., Disp: , Rfl:  .  bisacodyl (DULCOLAX) 10 MG suppository, Place 10 mg rectally daily as needed for mild constipation (if no B/M in 72 hours)., Disp: , Rfl:  .  budesonide -formoterol  (SYMBICORT ) 160-4.5 MCG/ACT inhaler, Inhale 2 puffs into the lungs 2 (two) times daily., Disp: , Rfl:  .  Cholecalciferol  (VITAMIN D3) 50 MCG (2000 UT) capsule, Take 4,000 Units by mouth daily., Disp: , Rfl:  .  diphenhydrAMINE (BENADRYL) 12.5 MG/5ML elixir, Take 25 mg by mouth every 4 (four) hours as needed for itching or allergies (or nasal congestion)., Disp: , Rfl:  .  guaifenesin  (ROBITUSSIN) 100 MG/5ML syrup, Take 300 mg by mouth every 4 (four) hours as needed for cough., Disp: , Rfl:  .  hydrocortisone cream 1 %, Apply 1 Application topically as needed for itching (or bug bites, skin irritation, rashes, chapped skin- affected areas)., Disp: , Rfl:  .  hydrOXYzine  (VISTARIL ) 50 MG capsule, Take 50 mg by mouth every 8 (eight) hours as needed (for behaviors not controlled by BSP lasting longer than 5 minutes)., Disp: , Rfl:  .  loperamide (IMODIUM A-D) 2 MG tablet, Take 2-4 mg by mouth See admin instructions. Take 4 mg by mouth for initial dose of diarrhea and 2 mg after each subsequent loose stool- cannot exceed a total of 16 mg/24 hours, Disp: , Rfl:  .  loratadine  (CLARITIN ) 10 MG tablet, Take 10 mg by mouth daily., Disp: , Rfl:  .  magnesium  hydroxide (MILK OF MAGNESIA) 400 MG/5ML suspension, Take 15-30 mLs by mouth daily as needed for mild constipation (15 ml's if under 100 pounds and 30 ml's if over 100 pounds- if no relief from Dulcolax suppository)., Disp: , Rfl:  .   neomycin -bacitracin -polymyxin [id number] OINT, Apply 1 Application topically See admin instructions. Apply twice daily to superficial abrasions and lacerations after cleansing. Cover with a dressing., Disp: , Rfl:  .  promethazine  (PHENERGAN ) 25  MG tablet, Take 25 mg by mouth See admin instructions. Take 25 mg by mouth every 4-6 hours as needed for vomiting 2 or more times in four hours and call MD if symptoms persist, Disp: , Rfl:  .  QUEtiapine  (SEROQUEL ) 400 MG tablet, Take 1 tablet (400 mg total) by mouth at bedtime., Disp: 30 tablet, Rfl: 0 .  sodium phosphate (FLEET) 7-19 GM/118ML ENEM, Place 1 enema rectally daily as needed (for constipation if no results from Dulcolax suppository and milk of magnesia)., Disp: , Rfl:  .  venlafaxine  XR (EFFEXOR -XR) 75 MG 24 hr capsule, Take 1 capsule (75 mg total) by mouth daily with breakfast., Disp: 30 capsule, Rfl: 0 .  verapamil  (CALAN ) 120 MG tablet, Take 120 mg by mouth in the morning and at bedtime., Disp: , Rfl:  .  verapamil  (CALAN ) 40 MG tablet, Take 40 mg by mouth daily at 12 noon., Disp: , Rfl:  .  QUEtiapine  (SEROQUEL ) 50 MG tablet, Take 1 tablet (50 mg total) by mouth 2 (two) times daily. (Patient not taking: Reported on 09/07/2023), Disp: 60 tablet, Rfl: 0  Allergies: Allergies  Allergen Reactions  . Fluoxetine Rash and Other (See Comments)    Patient reports allergy and rash across neck when trying this medication  . Inderal  La [Propranolol  Hcl] Rash  . Inderal  [Propranolol ] Rash  . Lisinopril Cough  . Risperidone Other (See Comments)    Twitching of mouth    Gailen JAYSON Ivans, NP-PMHNP-BC

## 2023-09-08 NOTE — ED Provider Notes (Signed)
 Emergency Medicine Observation Re-evaluation Note  Brittany Palmer is a 28 y.o. female, seen on rounds today.  Pt initially presented to the ED for complaints of Suicide Attempt Currently, the patient is resting comfortably.  Physical Exam  BP (!) 138/92   Pulse 96   Temp 98.8 F (37.1 C)   Resp 17   Ht 5' 5 (1.651 m)   Wt 68.5 kg   SpO2 97%   BMI 25.13 kg/m  Physical Exam General: NAD   ED Course / MDM  EKG:EKG Interpretation Date/Time:  Sunday September 07 2023 23:16:08 EST Ventricular Rate:  99 PR Interval:  192 QRS Duration:  87 QT Interval:  375 QTC Calculation: 482 R Axis:   53  Text Interpretation: Sinus rhythm RSR' in V1 or V2, probably normal variant Borderline prolonged QT interval Confirmed by Garrick Charleston [phone]) on 09/07/2023 11:50:22 PM  I have reviewed the labs performed to date as well as medications administered while in observation.  Recent changes in the last 24 hours include no acute events reported.  Plan  Current plan is for pending psychiatric evaluation.    Laurice Maude BROCKS, MD 09/08/23 [phone]

## 2023-09-08 NOTE — Progress Notes (Signed)
 LCSW Progress Note  [id number]   Brittany Palmer  09/08/2023  12:59 PM  Description:   Inpatient Psychiatric Referral  Patient was recommended inpatient per Gailen Ivans NP There are no available beds at [facility], per [facility] Cherylynn Ernst RN. Patient was referred to the following out of network facilities:   [facility] Provider Address Phone Fax  [facility] [address] [place] [address] [phone]  [facility] Patient Placement [facility] [place], [place] [phone]  [facility] [address] [place] [address] [phone]  [facility] [address] [place] [address] [phone]  [facility] [address] [place], [place] [address] [phone]  [facility] [address] [place] [address] [phone]  [facility] [address] [place] [address] [phone]  [facility] [address], [place] [address] [phone]  [facility] [address] [place] [address] [phone]  [facility] [address], [place] [address] [phone]  [facility] [address] [place] [address] [phone]  [facility] EFAX [address] [place], [place] [phone]  [facility] [address], [place] [address] [phone]  [facility] [address] [place] [address] [phone]  [facility] [address] [place] [address] [phone]      Situation ongoing, CSW to continue following and update chart as more  information becomes available.      [name], MSW, LCSW  09/08/2023 12:59 PM

## 2023-09-08 NOTE — ED Notes (Signed)
 Pt notified NT and EMT-P of her chest feeling tight. Provider notified.

## 2023-09-08 NOTE — Progress Notes (Addendum)
 Pt has been accepted to [facility] Today 09/08/2023 Bed assignment: Main campus  Pt meets inpatient criteria per Gailen Ivans NP   Attending Physician will be Millie Manners, MD  Report can be called to: [phone] (this is a pager, please leave call-back number when giving report)  Pt can arrive anytime today   Care Team Notified:  Gailen Ivans NP, Morna Quiet RN, Arthea Husk RN  [place] Solomon Skowronek LCSW-A   09/08/2023 1:46 PM

## 2023-09-08 NOTE — BH Assessment (Signed)
 Clinician contacted Okey Dupre, Paramedic via secure chat asking if pt was medically cleared for assessment. Clinician awaiting a response.

## 2023-09-08 NOTE — ED Notes (Addendum)
 Pt was able to ambulate from hall bed to blanket warmer and back without assist

## 2023-09-08 NOTE — ED Notes (Signed)
 Patient dressed out in purple 9:00 am she is complaining about her throat is sore and burning nurse made aware

## 2023-11-25 ENCOUNTER — Emergency Department (HOSPITAL_COMMUNITY)
Admission: EM | Admit: 2023-11-25 | Discharge: 2023-11-25 | Disposition: A | Discharge status: Home / Self Care | Attending: Emergency Medicine | Admitting: Emergency Medicine

## 2023-11-25 DIAGNOSIS — R000 Tachycardia, unspecified: Secondary | ICD-10-CM | POA: Insufficient documentation

## 2023-11-25 DIAGNOSIS — R110 Nausea: Secondary | ICD-10-CM | POA: Diagnosis present

## 2023-11-25 DIAGNOSIS — E860 Dehydration: Secondary | ICD-10-CM | POA: Insufficient documentation

## 2023-11-25 LAB — CBC WITH DIFFERENTIAL/PLATELET
Abs Immature Granulocytes: 0.01 10*3/uL (ref 0.00–0.07)
Basophils Absolute: 0 10*3/uL (ref 0.0–0.1)
Basophils Relative: 0 %
Eosinophils Absolute: 0 10*3/uL (ref 0.0–0.5)
Eosinophils Relative: 0 %
HCT: 42 % (ref 36.0–46.0)
Hemoglobin: 14.1 g/dL (ref 12.0–15.0)
Immature Granulocytes: 0 %
Lymphocytes Relative: 21 %
Lymphs Abs: 1.5 10*3/uL (ref 0.7–4.0)
MCH: 29.5 pg (ref 26.0–34.0)
MCHC: 33.6 g/dL (ref 30.0–36.0)
MCV: 87.9 fL (ref 80.0–100.0)
Monocytes Absolute: 0.5 10*3/uL (ref 0.1–1.0)
Monocytes Relative: 7 %
Neutro Abs: 4.9 10*3/uL (ref 1.7–7.7)
Neutrophils Relative %: 72 %
Platelets: 253 10*3/uL (ref 150–400)
RBC: 4.78 MIL/uL (ref 3.87–5.11)
RDW: 11.9 % (ref 11.5–15.5)
WBC: 7 10*3/uL (ref 4.0–10.5)
nRBC: 0 % (ref 0.0–0.2)

## 2023-11-25 LAB — URINALYSIS, ROUTINE W REFLEX MICROSCOPIC
Bilirubin Urine: NEGATIVE
Glucose, UA: NEGATIVE mg/dL
Hgb urine dipstick: NEGATIVE
Ketones, ur: 80 mg/dL — AB
Leukocytes,Ua: NEGATIVE
Nitrite: NEGATIVE
Protein, ur: NEGATIVE mg/dL
Specific Gravity, Urine: 1.004 — ABNORMAL LOW (ref 1.005–1.030)
pH: 5 (ref 5.0–8.0)

## 2023-11-25 LAB — BASIC METABOLIC PANEL
Anion gap: 13 (ref 5–15)
BUN: 6 mg/dL (ref 6–20)
CO2: 24 mmol/L (ref 22–32)
Calcium: 9.1 mg/dL (ref 8.9–10.3)
Chloride: 98 mmol/L (ref 98–111)
Creatinine, Ser: 0.88 mg/dL (ref 0.44–1.00)
GFR, Estimated: >60 mL/min (ref >60)
Glucose, Bld: 74 mg/dL (ref 70–99)
Potassium: 3.1 mmol/L — ABNORMAL LOW (ref 3.5–5.1)
Sodium: 135 mmol/L (ref 135–145)

## 2023-11-25 LAB — PREGNANCY, URINE: Preg Test, Ur: NEGATIVE

## 2023-11-25 MED ORDER — SODIUM CHLORIDE 0.9 % IV BOLUS
1000.0000 mL | Freq: Once | INTRAVENOUS | Status: AC
  Administered 2023-11-25: 1000 mL via INTRAVENOUS

## 2023-11-25 MED ORDER — ONDANSETRON HCL 4 MG/2ML IJ SOLN
4.0000 mg | Freq: Once | INTRAMUSCULAR | Status: AC
  Administered 2023-11-25: 4 mg via INTRAVENOUS
  Filled 2023-11-25: qty 2

## 2023-11-25 NOTE — ED Triage Notes (Signed)
 Pt arrives via [facility] from [facility] group home c/o weakness. Per pt she has only eaten applesauce for the last six days in an effort to lose weight. Pt also c/o nausea. Pt denies pain.   Pt's legal guardian is mother Skya Mccullum [phone].

## 2023-11-25 NOTE — Discharge Instructions (Signed)
 You need to eat and drink more than just applesauce

## 2023-11-25 NOTE — ED Provider Triage Note (Signed)
 Emergency Medicine Provider Triage Evaluation Note  Brittany Palmer , a 28 y.o. female  was evaluated in triage.  Pt complains of weakness, onset after changing diet to only applesauce in an effort to lose weight. Mild nausea, no vomiting.  Review of Systems  Positive: Weakness, nausea, intermittent abdominal pain Negative: Fever, chills, dysuria  Physical Exam  BP (!) 146/105 (BP Location: Left Arm)   Pulse (!) 103   Temp 98.2 F (36.8 C) (Oral)   Resp 18   SpO2 100%  Gen:   Awake, no distress   Resp:  Normal effort  MSK:   Moves extremities without difficulty  Other:  Abdomen soft  Medical Decision Making  Medically screening exam initiated at 1:22 PM.  Appropriate orders placed.  Brittany Palmer was informed that the remainder of the evaluation will be completed by another provider, this initial triage assessment does not replace that evaluation, and the importance of remaining in the ED until their evaluation is complete.     Brittany Morn, NP 11/25/23 1326

## 2023-11-25 NOTE — ED Notes (Signed)
 Fluid/PO Challenge: Pt was able to tolerated PO fluids with complaints of little nausea or vomiting.

## 2023-11-25 NOTE — ED Provider Notes (Signed)
  [facility] EMERGENCY DEPARTMENT AT [facility] Provider Note   CSN: [id number] Arrival date & time: 11/25/23  1239     History  No chief complaint on file.   Brittany Palmer is a 28 y.o. female.  HPI Patient presents from group home.  Reportedly has not eaten anything except applesauce in the last 6 days because she is trying to lose weight.  Has had some nausea.  No vomiting.  Denies possibility of pregnancy.  States she does feel weak.   No past medical history on file.  Home Medications Prior to Admission medications   Not on File      Allergies    Patient has no allergy information on record.    Review of Systems   Review of Systems  Physical Exam Updated Vital Signs BP (!) 146/105 (BP Location: Left Arm)   Pulse (!) 103   Temp 98.2 F (36.8 C) (Oral)   Resp 18   SpO2 100%  Physical Exam Vitals and nursing note reviewed.  Cardiovascular:     Rate and Rhythm: Tachycardia present.  Chest:     Chest wall: No tenderness.  Abdominal:     Tenderness: There is no abdominal tenderness.  Skin:    Capillary Refill: Capillary refill takes less than 2 seconds.  Neurological:     Mental Status: She is alert.     ED Results / Procedures / Treatments   Labs (all labs ordered are listed, but only abnormal results are displayed) Labs Reviewed  BASIC METABOLIC PANEL - Abnormal; Notable for the following components:      Result Value   Potassium 3.1 (*)    All other components within normal limits  URINALYSIS, ROUTINE W REFLEX MICROSCOPIC - Abnormal; Notable for the following components:   Color, Urine STRAW (*)    Specific Gravity, Urine 1.004 (*)    Ketones, ur 80 (*)    All other components within normal limits  CBC WITH DIFFERENTIAL/PLATELET  PREGNANCY, URINE    EKG None  Radiology No results found.  Procedures Procedures    Medications Ordered in ED Medications  sodium chloride 0.9 % bolus 1,000 mL (0 mLs Intravenous Stopped 11/25/23  1536)  ondansetron (ZOFRAN) injection 4 mg (4 mg Intravenous Given 11/25/23 1358)    ED Course/ Medical Decision Making/ A&P                                 Medical Decision Making Amount and/or Complexity of Data Reviewed Labs: ordered.  Risk Prescription drug management.   Patient with decreased oral intake.  Nausea.  Has only been eating applesauce for 6 days.  Differential diagnose includes dehydration electrolyte abnormalities.  Will get basic blood work.  Will give fluid bolus and give antiemetic.  Feels better after treatment.  Is tolerated orals.  Discharged home with group home..        Final Clinical Impression(s) / ED Diagnoses Final diagnoses:  Dehydration    Rx / DC Orders ED Discharge Orders     None         Benjiman Core, MD 11/25/23 1606
